# Patient Record
Sex: Female | Born: 1990 | Race: White | Hispanic: No | Marital: Married
Health system: Southern US, Community
[De-identification: ages and names within clinical notes are randomized; demographics above are authoritative.]

## PROBLEM LIST (undated history)

## (undated) DIAGNOSIS — E282 Polycystic ovarian syndrome: Secondary | ICD-10-CM

## (undated) HISTORY — DX: Polycystic ovarian syndrome: E28.2

---
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
## 2015-03-05 ENCOUNTER — Encounter: Payer: Self-pay | Admitting: Skilled Nursing Facility1

## 2015-03-05 ENCOUNTER — Encounter: Payer: BLUE CROSS/BLUE SHIELD | Attending: Obstetrics & Gynecology | Admitting: Skilled Nursing Facility1

## 2015-03-05 VITALS — Ht 62.0 in | Wt 161.0 lb

## 2015-03-05 DIAGNOSIS — E663 Overweight: Secondary | ICD-10-CM | POA: Diagnosis not present

## 2015-03-05 DIAGNOSIS — Z713 Dietary counseling and surveillance: Secondary | ICD-10-CM | POA: Insufficient documentation

## 2015-03-05 NOTE — Progress Notes (Signed)
  Medical Nutrition Therapy:  Appt start time: 1500 end time:  1530.   Assessment:  Primary concerns today: referred for PCOS.  Dietitian states she wants to eat healthy. Pt states she is [place]. Pt states she has had lots of abdominal pain that has been attributed to PCOS. Pt states she Eats eggs and fish. Pt states her sleep is not god with no rest.  Preferred Learning Style:   No preference indicated   Learning Readiness:   Change in progress  MEDICATIONS: See List   DIETARY INTAKE:  Usual eating pattern includes 3 meals and 2-3 snacks per day.  Everyday foods include none stated.  Avoided foods include none stated.    24-hr recall:  B ( AM): milk and carnation breafast----english muffin sandwhich Snk ( AM): oranges L ( PM): tuna sandwhiches----ordered pizza---beans and rice with greek yogurt Snk ( PM): almonds---cheese D ( PM): fish, rice, vegetables Snk ( PM): leftover pizza Beverages: sweet tea, coffee, water, flavored water, hot tea   Usual physical activity: ADL's  Estimated energy needs: 1800 calories 200 g carbohydrates 135 g protein 50 g fat  Progress Towards Goal(s):  In progress.   Nutritional Diagnosis:  NB-1.1 Food and nutrition-related knowledge deficit As related to newly diagnosed PCOS.  As evidenced by pt report and 24 hr recall.    Intervention:  Nutrition counseling for PCOS. Dietitian educated the pt on carbohydrates, balanced meals, carbohydrates throughout the day, and physical activity.  Goals: -Only consume serving sizes -Do not eat carbohydrates without protein -Limit the amount of pizza you drink -Limit the amount of sweet tea and caffinated beverages you drink  Teaching Method Utilized:  Visual Auditory  Demonstrated degree of understanding via:  Teach Back   Monitoring/Evaluation:  Dietary intake, exercise, and body weight prn.

## 2015-03-05 NOTE — Patient Instructions (Signed)
-  Only consume serving sizes -Do not eat carbohydrates without protein -Limit the amount of pizza you drink -Limit the amount of sweet tea and caffinated beverages you drink

## 2015-04-13 ENCOUNTER — Encounter: Payer: Self-pay | Admitting: Obstetrics & Gynecology

## 2015-08-28 DIAGNOSIS — H5213 Myopia, bilateral: Secondary | ICD-10-CM | POA: Diagnosis not present

## 2015-10-19 DIAGNOSIS — M79671 Pain in right foot: Secondary | ICD-10-CM | POA: Diagnosis not present

## 2015-10-19 DIAGNOSIS — M2011 Hallux valgus (acquired), right foot: Secondary | ICD-10-CM | POA: Diagnosis not present

## 2015-10-19 DIAGNOSIS — M722 Plantar fascial fibromatosis: Secondary | ICD-10-CM | POA: Diagnosis not present

## 2015-10-19 DIAGNOSIS — M71571 Other bursitis, not elsewhere classified, right ankle and foot: Secondary | ICD-10-CM | POA: Diagnosis not present

## 2015-11-02 DIAGNOSIS — M722 Plantar fascial fibromatosis: Secondary | ICD-10-CM | POA: Diagnosis not present

## 2015-11-02 DIAGNOSIS — M71571 Other bursitis, not elsewhere classified, right ankle and foot: Secondary | ICD-10-CM | POA: Diagnosis not present

## 2015-11-18 DIAGNOSIS — M722 Plantar fascial fibromatosis: Secondary | ICD-10-CM | POA: Diagnosis not present

## 2015-11-18 DIAGNOSIS — M71571 Other bursitis, not elsewhere classified, right ankle and foot: Secondary | ICD-10-CM | POA: Diagnosis not present

## 2016-03-19 DIAGNOSIS — M549 Dorsalgia, unspecified: Secondary | ICD-10-CM | POA: Diagnosis not present

## 2016-03-19 DIAGNOSIS — E669 Obesity, unspecified: Secondary | ICD-10-CM | POA: Diagnosis not present

## 2016-03-19 DIAGNOSIS — M5430 Sciatica, unspecified side: Secondary | ICD-10-CM | POA: Diagnosis not present

## 2016-04-06 DIAGNOSIS — M719 Bursopathy, unspecified: Secondary | ICD-10-CM | POA: Diagnosis not present

## 2016-05-03 DIAGNOSIS — N912 Amenorrhea, unspecified: Secondary | ICD-10-CM | POA: Diagnosis not present

## 2016-05-03 DIAGNOSIS — M25551 Pain in right hip: Secondary | ICD-10-CM | POA: Diagnosis not present

## 2016-05-03 DIAGNOSIS — Z3202 Encounter for pregnancy test, result negative: Secondary | ICD-10-CM | POA: Diagnosis not present

## 2016-05-16 DIAGNOSIS — M5441 Lumbago with sciatica, right side: Secondary | ICD-10-CM | POA: Diagnosis not present

## 2016-06-30 DIAGNOSIS — M545 Low back pain: Secondary | ICD-10-CM | POA: Diagnosis not present

## 2016-07-07 DIAGNOSIS — M545 Low back pain: Secondary | ICD-10-CM | POA: Diagnosis not present

## 2016-07-21 DIAGNOSIS — M545 Low back pain: Secondary | ICD-10-CM | POA: Diagnosis not present

## 2016-07-27 DIAGNOSIS — Z6830 Body mass index (BMI) 30.0-30.9, adult: Secondary | ICD-10-CM | POA: Diagnosis not present

## 2016-07-27 DIAGNOSIS — E669 Obesity, unspecified: Secondary | ICD-10-CM | POA: Diagnosis not present

## 2016-07-27 DIAGNOSIS — Z0000 Encounter for general adult medical examination without abnormal findings: Secondary | ICD-10-CM | POA: Diagnosis not present

## 2016-07-27 DIAGNOSIS — N926 Irregular menstruation, unspecified: Secondary | ICD-10-CM | POA: Diagnosis not present

## 2016-08-09 ENCOUNTER — Other Ambulatory Visit (HOSPITAL_COMMUNITY)
Admission: RE | Admit: 2016-08-09 | Discharge: 2016-08-09 | Disposition: A | Discharge status: Home / Self Care | Payer: BLUE CROSS/BLUE SHIELD | Source: Ambulatory Visit | Attending: Obstetrics and Gynecology | Admitting: Obstetrics and Gynecology

## 2016-08-09 ENCOUNTER — Other Ambulatory Visit: Payer: Self-pay | Admitting: Obstetrics and Gynecology

## 2016-08-09 DIAGNOSIS — Z309 Encounter for contraceptive management, unspecified: Secondary | ICD-10-CM | POA: Diagnosis not present

## 2016-08-09 DIAGNOSIS — Z01419 Encounter for gynecological examination (general) (routine) without abnormal findings: Secondary | ICD-10-CM | POA: Insufficient documentation

## 2016-08-09 DIAGNOSIS — Z113 Encounter for screening for infections with a predominantly sexual mode of transmission: Secondary | ICD-10-CM | POA: Diagnosis not present

## 2016-08-09 DIAGNOSIS — Z01411 Encounter for gynecological examination (general) (routine) with abnormal findings: Secondary | ICD-10-CM | POA: Diagnosis not present

## 2016-08-11 LAB — CYTOLOGY - PAP: Diagnosis: NEGATIVE

## 2016-11-02 DIAGNOSIS — J069 Acute upper respiratory infection, unspecified: Secondary | ICD-10-CM | POA: Diagnosis not present

## 2016-11-02 DIAGNOSIS — B09 Unspecified viral infection characterized by skin and mucous membrane lesions: Secondary | ICD-10-CM | POA: Diagnosis not present

## 2017-01-09 DIAGNOSIS — M25512 Pain in left shoulder: Secondary | ICD-10-CM | POA: Diagnosis not present

## 2017-01-25 DIAGNOSIS — M7542 Impingement syndrome of left shoulder: Secondary | ICD-10-CM | POA: Diagnosis not present

## 2017-02-09 DIAGNOSIS — M7542 Impingement syndrome of left shoulder: Secondary | ICD-10-CM | POA: Diagnosis not present

## 2017-02-16 DIAGNOSIS — M7542 Impingement syndrome of left shoulder: Secondary | ICD-10-CM | POA: Diagnosis not present

## 2017-03-02 DIAGNOSIS — M7542 Impingement syndrome of left shoulder: Secondary | ICD-10-CM | POA: Diagnosis not present

## 2017-03-09 DIAGNOSIS — M7542 Impingement syndrome of left shoulder: Secondary | ICD-10-CM | POA: Diagnosis not present

## 2017-08-08 DIAGNOSIS — Z6834 Body mass index (BMI) 34.0-34.9, adult: Secondary | ICD-10-CM | POA: Diagnosis not present

## 2017-08-08 DIAGNOSIS — Z0000 Encounter for general adult medical examination without abnormal findings: Secondary | ICD-10-CM | POA: Diagnosis not present

## 2017-08-08 DIAGNOSIS — Z13220 Encounter for screening for lipoid disorders: Secondary | ICD-10-CM | POA: Diagnosis not present

## 2017-08-11 DIAGNOSIS — Z01411 Encounter for gynecological examination (general) (routine) with abnormal findings: Secondary | ICD-10-CM | POA: Diagnosis not present

## 2017-09-03 DIAGNOSIS — N3001 Acute cystitis with hematuria: Secondary | ICD-10-CM | POA: Diagnosis not present

## 2018-02-20 DIAGNOSIS — E282 Polycystic ovarian syndrome: Secondary | ICD-10-CM | POA: Diagnosis not present

## 2018-03-23 DIAGNOSIS — E282 Polycystic ovarian syndrome: Secondary | ICD-10-CM | POA: Diagnosis not present

## 2019-12-29 ENCOUNTER — Emergency Department: Payer: 59

## 2019-12-29 ENCOUNTER — Emergency Department
Admission: EM | Admit: 2019-12-29 | Discharge: 2019-12-30 | Disposition: A | Discharge status: Home / Self Care | Payer: 59 | Attending: Emergency Medicine | Admitting: Emergency Medicine

## 2019-12-29 ENCOUNTER — Other Ambulatory Visit: Payer: Self-pay

## 2019-12-29 DIAGNOSIS — M25512 Pain in left shoulder: Secondary | ICD-10-CM | POA: Insufficient documentation

## 2019-12-29 DIAGNOSIS — R0789 Other chest pain: Secondary | ICD-10-CM | POA: Insufficient documentation

## 2019-12-29 DIAGNOSIS — Z5321 Procedure and treatment not carried out due to patient leaving prior to being seen by health care provider: Secondary | ICD-10-CM | POA: Diagnosis not present

## 2019-12-29 LAB — BASIC METABOLIC PANEL
Anion gap: 14 (ref 5–15)
BUN: 12 mg/dL (ref 6–20)
CO2: 24 mmol/L (ref 22–32)
Calcium: 9.5 mg/dL (ref 8.9–10.3)
Chloride: 101 mmol/L (ref 98–111)
Creatinine, Ser: 0.74 mg/dL (ref 0.44–1.00)
GFR, Estimated: >60 mL/min (ref >60)
Glucose, Bld: 126 mg/dL — ABNORMAL HIGH (ref 70–99)
Potassium: 4 mmol/L (ref 3.5–5.1)
Sodium: 139 mmol/L (ref 135–145)

## 2019-12-29 LAB — CBC
HCT: 45.5 % (ref 36.0–46.0)
Hemoglobin: 16.1 g/dL — ABNORMAL HIGH (ref 12.0–15.0)
MCH: 30.7 pg (ref 26.0–34.0)
MCHC: 35.4 g/dL (ref 30.0–36.0)
MCV: 86.8 fL (ref 80.0–100.0)
Platelets: 346 10*3/uL (ref 150–400)
RBC: 5.24 MIL/uL — ABNORMAL HIGH (ref 3.87–5.11)
RDW: 11.9 % (ref 11.5–15.5)
WBC: 13.2 10*3/uL — ABNORMAL HIGH (ref 4.0–10.5)
nRBC: 0 % (ref 0.0–0.2)

## 2019-12-29 LAB — TROPONIN I (HIGH SENSITIVITY): Troponin I (High Sensitivity): <2 ng/L (ref <18)

## 2019-12-29 NOTE — ED Triage Notes (Signed)
Pt comes in for left shoulder pain/burning from chest to jaw. Pt states it got worse when she tried to use her shoulder to get in the car today.

## 2019-12-31 ENCOUNTER — Encounter: Payer: Self-pay | Admitting: Skilled Nursing Facility1

## 2020-01-20 NOTE — Progress Notes (Signed)
Subjective:    CC: L shoulder pain  I, Christina Hull, LAT, ATC, am serving as scribe for Dr. Clementeen Hull.  HPI: Pt is a 29 y/o female presenting w/ c/o L shoulder pain over a month w/ no known MOI.  She locates her pain to to anterior shoulder and deep into joint, over trap. Pn goes into chest and down anterior upper arm. Pt describes pn as "soreness."  Pain is worse with lifting and at bedtime.  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  She denies any pain radiating below the level of the elbow.  She had x-rays obtained recently of her shoulder and chest which were normal.  Aggravating factors: overhead AROM Treatments tried: OTC analgesics; steroid dose pack; heat, ice. Increased walking  Diagnostic imaging: L shoulder XR and chest x-ray- 12/29/19  Pertinent review of Systems: No fevers or chills  Relevant historical information: Otherwise healthy   Objective:    Vitals:   01/21/20 0834  BP: (!) 106/58  Pulse: 84  SpO2: 97%   General: Well Developed, well nourished, and in no acute distress.   MSK: C-spine normal-appearing nontender midline.  Tender palpation left trapezius. Normal cervical motion. Upper extremity strength is intact throughout bilateral extremities except noted below. Reflexes intact and equal bilaterally. Negative Spurling's test.  Left shoulder normal-appearing Not particularly tender at [facility] joint biceps tendon or [place] joint into chest.  Somewhat tender to palpation inferior to [place] joint along costal margin at left superior sternal costal area Range of motion abduction 140 degrees.  External rotation full internal rotation lumbar spine. Strength intact abduction and internal rotation 5/5.  4/5 external rotation with some pain. Positive Hawkins and Neer's test.  Negative empty can test. Negative Yergason's and speeds test. Positive crossover arm compression test.    Lab and Radiology Results  DG  Chest 2 View  Result Date: 12/29/2019 CLINICAL DATA:  Chest pain EXAM: CHEST - 2 VIEW COMPARISON:  None. FINDINGS: Heart and mediastinal contours are within normal limits. No focal opacities or effusions. No acute bony abnormality. IMPRESSION: No active cardiopulmonary disease. Electronically Signed   By: Charlett Nose M.D.   On: 12/29/2019 16:40   DG Shoulder Left  Result Date: 12/29/2019 CLINICAL DATA:  Left shoulder pain EXAM: LEFT SHOULDER - 2+ VIEW COMPARISON:  None. FINDINGS: There is no evidence of fracture or dislocation. There is no evidence of arthropathy or other focal bone abnormality. Soft tissues are unremarkable. IMPRESSION: Negative. Electronically Signed   By: Charlett Nose M.D.   On: 12/29/2019 16:41   I, Christina Hull, personally (independently) visualized and performed the interpretation of the images attached in this note.  No significant abnormality seen in shoulder and chest x-ray.  Diagnostic Limited MSK Ultrasound of: Left shoulder Biceps tendon intact normal-appearing Subscapularis tendon intact normal-appearing Supraspinatus tendon intact without tear or retraction. Mild increased thickness subacromial bursa present. Infraspinatus tendon is intact and normal appearing AC joint normal-appearing Impression: Mild subacromial bursitis    Impression and Recommendations:    Assessment and Plan: 29 y.o. female with left shoulder pain and chest pain.  Multifactorial.  Some of the pain is related to rotator cuff tendinopathy and bursitis.  However she also has some periscapular dysfunction and some chest wall dysfunction as well.  She should do quite well with physical therapy.  Plan for referral to physical therapy at [facility].  If this cannot work in  a timely manner would consider med [facility] as next choice given where she lives.  Recheck back with me in about 6 weeks.  Recheck or return sooner if needed.  Also recommend heat and TENS unit and tizanidine.   Tizanidine prescribed.Marland Kitchen  PDMP not reviewed this encounter. Orders Placed This Encounter  Procedures  . [place] LIMITED JOINT SPACE STRUCTURES UP LEFT(NO LINKED CHARGES)    Standing Status:   Future    Number of Occurrences:   1    Standing Expiration Date:   07/20/2020    Order Specific Question:   Reason for Exam (SYMPTOM  OR DIAGNOSIS REQUIRED)    Answer:   L shoulder pain    Order Specific Question:   Preferred imaging location?    Answer:   Adult nurse [facility]  . Ambulatory referral to Physical Therapy    Referral Priority:   Routine    Referral Type:   Physical Medicine    Referral Reason:   Specialty Services Required    Requested Specialty:   Physical Therapy   Meds ordered this encounter  Medications  . tiZANidine (ZANAFLEX) 4 MG tablet    Sig: Take 1 tablet (4 mg total) by mouth every 6 (six) hours as needed for muscle spasms.    Dispense:  30 tablet    Refill:  1    Discussed warning signs or symptoms. Please see discharge instructions. Patient expresses understanding.   The above documentation has been reviewed and is accurate and complete Christina Hull, M.D.

## 2020-01-21 ENCOUNTER — Other Ambulatory Visit: Payer: Self-pay

## 2020-01-21 ENCOUNTER — Ambulatory Visit (INDEPENDENT_AMBULATORY_CARE_PROVIDER_SITE_OTHER): Payer: 59 | Admitting: Family Medicine

## 2020-01-21 ENCOUNTER — Ambulatory Visit: Payer: Self-pay

## 2020-01-21 VITALS — BP 106/58 | HR 84 | Ht 62.0 in | Wt 198.0 lb

## 2020-01-21 DIAGNOSIS — M25512 Pain in left shoulder: Secondary | ICD-10-CM

## 2020-01-21 DIAGNOSIS — G8929 Other chronic pain: Secondary | ICD-10-CM

## 2020-01-21 MED ORDER — TIZANIDINE HCL 4 MG PO TABS: 4.0000 mg | ORAL_TABLET | Freq: Four times a day (QID) | ORAL | 1 refills | Status: DC | PRN

## 2020-01-21 NOTE — Patient Instructions (Addendum)
Thank you for coming in today.  I've referred you to Physical Therapy.  Let us know if you don't hear from them in one week.  Heating pad should help.   TENS UNIT: This is helpful for muscle pain and spasm.   Search and Purchase a TENS 7000 2nd edition at  www.tenspros.com or www.Amazon.com It should be less than $30.     TENS unit instructions: Do not shower or bathe with the unit on . Turn the unit off before removing electrodes or batteries . If the electrodes lose stickiness add a drop of water to the electrodes after they are disconnected from the unit and place on plastic sheet. If you continued to have difficulty, call the TENS unit company to purchase more electrodes. . Do not apply lotion on the skin area prior to use. Make sure the skin is clean and dry as this will help prolong the life of the electrodes. . After use, always check skin for unusual red areas, rash or other skin difficulties. If there are any skin problems, does not apply electrodes to the same area. . Never remove the electrodes from the unit by pulling the wires. . Do not use the TENS unit or electrodes other than as directed. . Do not change electrode placement without consultating your therapist or physician. Marland Kitchen Keep 2 fingers with between each electrode. . Wear time ratio is 2:1, on to off times.    For example on for 30 minutes off for 15 minutes and then on for 30 minutes off for 15 minutes    Try the muscle relaxer at bedtime as needed.    If not improving in 6 weeks or so recheck or let me know.

## 2020-02-10 ENCOUNTER — Other Ambulatory Visit: Payer: Self-pay

## 2020-02-10 ENCOUNTER — Ambulatory Visit: Payer: 59 | Attending: Family Medicine | Admitting: Physical Therapy

## 2020-02-10 ENCOUNTER — Encounter: Payer: Self-pay | Admitting: Physical Therapy

## 2020-02-10 DIAGNOSIS — R293 Abnormal posture: Secondary | ICD-10-CM | POA: Diagnosis present

## 2020-02-10 DIAGNOSIS — G8929 Other chronic pain: Secondary | ICD-10-CM | POA: Diagnosis present

## 2020-02-10 DIAGNOSIS — M25512 Pain in left shoulder: Secondary | ICD-10-CM | POA: Diagnosis not present

## 2020-02-10 DIAGNOSIS — M6281 Muscle weakness (generalized): Secondary | ICD-10-CM

## 2020-02-10 NOTE — Patient Instructions (Signed)
Access Code: [email or web address] URL: [email or web address] Date: 02/10/2020 Prepared by: Loistine Simas Beuhring  Exercises Supine Shoulder Flexion Extension AAROM with Dowel - 1 x daily - 7 x weekly - 1 sets - 10 reps Supine Cervical Retraction with Towel - 1 x daily - 7 x weekly - 1 sets - 10 reps - 5 hold Seated Scapular Retraction - 1 x daily - 7 x weekly - 1 sets - 10 reps Doorway Pec Stretch at 90 Degrees Abduction - 1 x daily - 7 x weekly - 1 sets - 2 reps - 20 hold

## 2020-02-10 NOTE — Therapy (Signed)
[facility] [address] [place], [place], [address] Phone: [phone]   Fax:  [phone]  Physical Therapy Evaluation  Patient Details  Name: Christina Hull MRN: [id number] Date of Birth: 09/11/1990 Referring Provider (PT): Rodolph Bong, MD   Encounter Date: 02/10/2020   PT End of Session - 02/10/20 1305    Visit Number 1    Date for PT Re-Evaluation 04/20/20    Authorization Type [employer]    PT Start Time 1015    PT Stop Time 1100    PT Time Calculation (min) 45 min    Activity Tolerance Patient tolerated treatment well;Patient limited by pain    Behavior During Therapy [facility] for tasks assessed/performed           History reviewed. No pertinent past medical history.  History reviewed. No pertinent surgical history.  There were no vitals filed for this visit.    Subjective Assessment - 02/10/20 1020    Subjective Pt had insidious onset of Lt shoulder pain 1-2 months ago.  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  Pt also notes pain extends into chest and Lt neck. Pain extends into arm but not below elbow.    Limitations House hold activities;Lifting    Diagnostic tests Xrays and [place]: RC intact, mild subacromial bursitis    Patient Stated Goals be able to use Lt arm for more than 20 min, be able to lift    Currently in Pain? Yes    Pain Score 0-No pain   up to 6/10   Pain Location Shoulder    Pain Orientation Left;Anterior;Proximal;Upper    Pain Descriptors / Indicators Aching;Dull;Sharp    Pain Type Chronic pain    Pain Radiating Towards neck and chest    Pain Onset More than a month ago    Pain Frequency Intermittent    Aggravating Factors  reaching, lifting, getting out of chair, bending over to pick something up    Pain Relieving Factors rest, heat              [facility] PT Assessment - 02/10/20 0001      Assessment   Medical Diagnosis M25.512,G89.29 (ICD-10-CM) -  Chronic left shoulder pain    Referring Provider (PT) Rodolph Bong, MD    Onset Date/Surgical Date --   1-2 mos ago   Hand Dominance Right    Next MD Visit 6 weeks, MRI and/or shot if no improvement    Prior Therapy yes for same shoulder      Precautions   Precautions None      Restrictions   Weight Bearing Restrictions No      Balance Screen   Has the patient fallen in the past 6 months No      Home Environment   Living Environment Private residence    Living Arrangements Spouse/significant other      Prior Function   Level of Independence Independent    Vocation Unemployed    Leisure walk, video games, TV, cook      Cognition   Overall Cognitive Status Within Functional Limits for tasks assessed      Observation/Other Assessments   Focus on Therapeutic Outcomes (FOTO)  49% goal 29%      Posture/Postural Control   Posture/Postural Control Postural limitations    Postural Limitations Rounded Shoulders;Forward head;Increased thoracic kyphosis      ROM / Strength   AROM / PROM / Strength  AROM;Strength      AROM   Overall AROM Comments Rt shoulder flexion and abduction end range stiffness no pain    AROM Assessment Site Cervical;Shoulder    Right/Left Shoulder Left    Left Shoulder Extension 15 Degrees    Left Shoulder Flexion 135 Degrees   painful arc, shuddering weakness   Left Shoulder ABduction 130 Degrees   painful arc, shuddering weakness   Left Shoulder Internal Rotation --   [facility]   Left Shoulder External Rotation --   [facility]   Cervical Flexion 30   left neck pain   Cervical Extension 25    Cervical - Right Side Bend 20    Cervical - Left Side Bend 20    Cervical - Right Rotation 70    Cervical - Left Rotation 70   tight     Strength   Overall Strength Comments bil shoulders 4-/5, Lt scapula 3/5, Rt scapula 4-/5      Palpation   Palpation comment Lt supraspinatus, Lt upper trap, bicep tendon, pectorals tender                       Objective  measurements completed on examination: See above findings.               PT Education - 02/10/20 1054    Education Details Access Code: [email or web address]    Person(s) Educated Patient    Methods Explanation;Demonstration;Handout    Comprehension Verbalized understanding;Returned demonstration            PT Short Term Goals - 02/10/20 1316      PT SHORT TERM GOAL #1   Title pt will be ind with initial HEP    Time 3    Period Weeks    Status New    Target Date 03/02/20      PT SHORT TERM GOAL #2   Title Pt will report reduced Lt shoulder pain by at least 20% with ADLs and light household tasks.    Time 4    Period Weeks    Status New    Target Date 03/09/20      PT SHORT TERM GOAL #3   Title Pt will demo improved scapular strength of at least 3+/5 for functional reaching    Time 4    Period Weeks    Status New    Target Date 03/09/20      PT SHORT TERM GOAL #4   Title Pt will be able to perform A/ROM of Lt shoulder flexion and scaption with min pain to at least 140 deg    Time 6    Period Weeks    Status New    Target Date 03/23/20             PT Long Term Goals - 02/10/20 1319      PT LONG TERM GOAL #1   Title Ind with advanced HEP and understand how to safely progress.    Time 12    Period Weeks    Status New    Target Date 05/04/20      PT LONG TERM GOAL #2   Title Pt will be able to perform ADLs and light household tasks for at least 30 min with minimal or no pain/fatigue.    Time 12    Period Weeks    Status New    Target Date 05/04/20      PT LONG TERM GOAL #3   Title Lt  shoulder girdle strength of at least 4/5 for improved reaching, lifting and carrying    Time 12    Period Weeks    Status New    Target Date 05/04/20      PT LONG TERM GOAL #4   Title Pt will achieve Lt shoulder flexion of at least 150 deg for improved overhead tasks.    Time 12    Period Weeks    Status New    Target Date 05/04/20      PT LONG TERM GOAL #5   Title  Reduce FOTO to </= 29% to demo less limitation    Baseline 49%    Time 12    Period Weeks    Status New    Target Date 05/04/20                  Plan - 02/10/20 1054    Clinical Impression Statement Pt is a pleasant 28yo with insidious onset of Lt shoulder pain approx 2 mos ago.  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  She is currently unemployed.  She reports Lt UE fatigue limits her to 20 min bouts of activity.  She has limited bil shoulder A/ROM for flexion and abduction, with shuddering weakness, pain and stiffness on Lt.  Lt shoulder pain radiates from anterior and superior into chest and up Lt side of neck.  Pt has signif weakness of Lt shoulder girdle, especially Lt scapula.  She has singif tenderness to palpation along Lt supraspinatus, upper trap, pectorals.  FOTO score is 49% with goal of 29%.  PT initiated HEP today along what Pt could do without pain.  Pt is participating in aquatic chronic pain program 3 days/week so wants to do PT 1x/week to start.  She will benefit from gentle mobility, stretching and strengthening/stabilization training to address pain, improve trunk postural strength, and improved functional use of Lt UE.    Personal Factors and Comorbidities Comorbidity 1;Fitness    Comorbidities chronic pain    Examination-Activity Limitations Lift;Bend;Reach Overhead;Dressing;Carry;Caring for Others    Examination-Participation Restrictions Community Activity;Shop;Meal Prep;Cleaning    Stability/Clinical Decision Making Stable/Uncomplicated    Clinical Decision Making Low    Rehab Potential Good    PT Frequency 1x / week    PT Duration 12 weeks    PT Treatment/Interventions ADLs/Self Care Home Management;Electrical Stimulation;Cryotherapy;Moist Heat;Iontophoresis 4mg /ml Dexamethasone;Neuromuscular re-education;Therapeutic exercise;Therapeutic activities;Functional mobility training;Manual techniques;Patient/family education;Taping;Passive range of  motion;Joint Manipulations;Spinal Manipulations    PT Next Visit Plan review HEP, Lt shoulder AA/ROM, trunk stabilization, gentle Lt scapular strength, give cervical and shoulder isometrics if tolerated    PT Home Exercise Plan Access Code: [email or web address]    Recommended Other Services Pt doing chronic pain management aquatic program with Y 3x/week    Consulted and Agree with Plan of Care Patient           Patient will benefit from skilled therapeutic intervention in order to improve the following deficits and impairments:  Decreased range of motion, Pain, Postural dysfunction, Decreased strength, Decreased mobility, Impaired UE functional use, Decreased activity tolerance, Improper body mechanics  Visit Diagnosis: Chronic left shoulder pain - Plan: PT plan of care cert/re-cert  Muscle weakness (generalized) - Plan: PT plan of care cert/re-cert  Abnormal posture - Plan: PT plan of care cert/re-cert     Problem List There are no problems to display for this patient.   Loistine SimasJohanna Beuhring, PT 02/10/20 1:27 PM   [facility]  [address], [address] [place], [place], [address] Phone: [phone]   Fax:  [phone]  Name: Christina Hull MRN: [id number] Date of Birth: 1991/03/13

## 2020-02-20 ENCOUNTER — Encounter: Payer: Self-pay | Admitting: Physical Therapy

## 2020-02-20 ENCOUNTER — Ambulatory Visit: Payer: 59 | Attending: Family Medicine | Admitting: Physical Therapy

## 2020-02-20 ENCOUNTER — Other Ambulatory Visit: Payer: Self-pay

## 2020-02-20 DIAGNOSIS — G8929 Other chronic pain: Secondary | ICD-10-CM | POA: Diagnosis present

## 2020-02-20 DIAGNOSIS — M6281 Muscle weakness (generalized): Secondary | ICD-10-CM

## 2020-02-20 DIAGNOSIS — M25512 Pain in left shoulder: Secondary | ICD-10-CM | POA: Insufficient documentation

## 2020-02-20 DIAGNOSIS — R293 Abnormal posture: Secondary | ICD-10-CM

## 2020-02-20 NOTE — Therapy (Signed)
[facility] [address], [address] [place], [place], [address] Phone: [phone]   Fax:  [phone]  Physical Therapy Treatment  Patient Details  Name: Christina Hull MRN: [id number] Date of Birth: [birth year] Referring Provider (PT): Rodolph Bong, MD   Encounter Date: 02/20/2020   PT End of Session - 02/20/20 1110    Visit Number 2    Date for PT Re-Evaluation 04/20/20    Authorization Type Cigna    PT Start Time 1102    PT Stop Time 1143    PT Time Calculation (min) 41 min    Activity Tolerance Patient tolerated treatment well;Patient limited by pain    Behavior During Therapy [facility] for tasks assessed/performed           History reviewed. No pertinent past medical history.  History reviewed. No pertinent surgical history.  There were no vitals filed for this visit.   Subjective Assessment - 02/20/20 1214    Subjective I am doing much better.  The aquatic workouts are helping, I can move more that I was. Pain feels like both sides but more the left    Patient Stated Goals be able to use Lt arm for more than 20 min, be able to lift    Currently in Pain? Yes    Pain Score 4     Pain Location Shoulder    Pain Orientation Left;Right    Pain Descriptors / Indicators Aching    Pain Type Chronic pain    Pain Radiating Towards neck and chest and front of the shoulder    Pain Onset More than a month ago    Pain Frequency Intermittent    Multiple Pain Sites No                             [facility] Adult PT Treatment/Exercise - 02/20/20 0001      Self-Care   Self-Care Other Self-Care Comments    Other Self-Care Comments  posture education in sitting      Exercises   Exercises Shoulder      Shoulder Exercises: Supine   External Rotation Strengthening;Both;5 reps;Theraband    Theraband Level (Shoulder External Rotation) Level 1 (Yellow)    External Rotation Limitations stopped due to feeling pain in  Lt shoulder    Other Supine Exercises cervical retraction with cues to do smaller ROM - pain free range      Shoulder Exercises: Pulleys   Flexion 2 minutes      Manual Therapy   Manual Therapy Myofascial release;Soft tissue mobilization    Soft tissue mobilization Lt upper trap and pecs    Myofascial Release suboccipitals and cervical paraspinals                    PT Short Term Goals - 02/20/20 1222      PT SHORT TERM GOAL #1   Title pt will be ind with initial HEP    Status Achieved             PT Long Term Goals - 02/10/20 1319      PT LONG TERM GOAL #1   Title Ind with advanced HEP and understand how to safely progress.    Time 12    Period Weeks    Status New    Target Date 05/04/20      PT LONG TERM GOAL #2   Title Pt will be  able to perform ADLs and light household tasks for at least 30 min with minimal or no pain/fatigue.    Time 12    Period Weeks    Status New    Target Date 05/04/20      PT LONG TERM GOAL #3   Title Lt shoulder girdle strength of at least 4/5 for improved reaching, lifting and carrying    Time 12    Period Weeks    Status New    Target Date 05/04/20      PT LONG TERM GOAL #4   Title Pt will achieve Lt shoulder flexion of at least 150 deg for improved overhead tasks.    Time 12    Period Weeks    Status New    Target Date 05/04/20      PT LONG TERM GOAL #5   Title Reduce FOTO to </= 29% to demo less limitation    Baseline 49%    Time 12    Period Weeks    Status New    Target Date 05/04/20                 Plan - 02/20/20 1216    Clinical Impression Statement Today is initial treatment since eval.  pt feeling good with the intial HEP and doing aquatic ex's.  Today's session focused on ROM and soft tissue release as well as improved seated posture.  Pt states she sits a good amount and her posture looked and felt better after review and demo of foot support, lumbar and thoracic support.  Pt will benefit from  skilled PT to continue working towards functional goals.    PT Treatment/Interventions ADLs/Self Care Home Management;Electrical Stimulation;Cryotherapy;Moist Heat;Iontophoresis 4mg /ml Dexamethasone;Neuromuscular re-education;Therapeutic exercise;Therapeutic activities;Functional mobility training;Manual techniques;Patient/family education;Taping;Passive range of motion;Joint Manipulations;Spinal Manipulations    PT Next Visit Plan review posture as needed, thoracic SB and rotation, soulder and cervical ROM, cervical and shoulder isometric    PT Home Exercise Plan Access Code: [email or web address]    Consulted and Agree with Plan of Care Patient           Patient will benefit from skilled therapeutic intervention in order to improve the following deficits and impairments:  Decreased range of motion,Pain,Postural dysfunction,Decreased strength,Decreased mobility,Impaired UE functional use,Decreased activity tolerance,Improper body mechanics  Visit Diagnosis: Chronic left shoulder pain  Muscle weakness (generalized)  Abnormal posture     Problem List There are no problems to display for this patient.   Geriann Lafont, PT 02/20/2020, 12:29 PM  [facility] [address], [address] [place], [place], [place] Phone: [phone]   Fax:  [phone]  Name: Christina Hull MRN: Mariel Aloe Date of Birth: Jul 14, 1990

## 2020-02-25 ENCOUNTER — Other Ambulatory Visit: Payer: Self-pay

## 2020-02-25 ENCOUNTER — Ambulatory Visit: Payer: 59 | Admitting: Physical Therapy

## 2020-02-25 ENCOUNTER — Encounter: Payer: Self-pay | Admitting: Physical Therapy

## 2020-02-25 DIAGNOSIS — M6281 Muscle weakness (generalized): Secondary | ICD-10-CM

## 2020-02-25 DIAGNOSIS — R293 Abnormal posture: Secondary | ICD-10-CM

## 2020-02-25 DIAGNOSIS — M25512 Pain in left shoulder: Secondary | ICD-10-CM

## 2020-02-25 DIAGNOSIS — G8929 Other chronic pain: Secondary | ICD-10-CM

## 2020-02-25 NOTE — Patient Instructions (Signed)
Access Code: [email or web address] URL: [email or web address] Date: 02/25/2020 Prepared by: Dwana Curd  Exercises Supine Shoulder Flexion Extension AAROM with Dowel - 1 x daily - 7 x weekly - 1 sets - 10 reps Supine Cervical Retraction with Towel - 1 x daily - 7 x weekly - 1 sets - 10 reps - 5 hold Seated Scapular Retraction - 1 x daily - 7 x weekly - 1 sets - 10 reps Doorway Pec Stretch at 90 Degrees Abduction - 1 x daily - 7 x weekly - 1 sets - 2 reps - 20 hold Seated Cervical Sidebending Stretch - 1 x daily - 7 x weekly - 3 sets - 10 reps Supine Shoulder External Rotation with Resistance - 1 x daily - 7 x weekly - 3 sets - 10 reps  Patient Education Office Posture

## 2020-02-25 NOTE — Therapy (Signed)
[facility] [address] [place], [place], [address] Phone: [phone]   Fax:  [phone]  Physical Therapy Treatment  Patient Details  Name: Christina Hull MRN: [id number] Date of Birth: Jan 28, 1991 Referring Provider (PT): Gregor Hams, MD   Encounter Date: 02/25/2020   PT End of Session - 02/25/20 0852    Visit Number 3    Date for PT Re-Evaluation 04/20/20    Authorization Type [employer]    PT Start Time 0846    PT Stop Time 0926    PT Time Calculation (min) 40 min    Activity Tolerance Patient tolerated treatment well;Patient limited by pain    Behavior During Therapy [facility] for tasks assessed/performed           History reviewed. No pertinent past medical history.  History reviewed. No pertinent surgical history.  There were no vitals filed for this visit.   Subjective Assessment - 02/25/20 0850    Subjective I feel like I am doing better that last week.  Pt states it was 3/10 when first waking up,  Pt states it feels better after moving a little    Patient Stated Goals be able to use Lt arm for more than 20 min, be able to lift    Currently in Pain? No/denies                             [facility] Adult PT Treatment/Exercise - 02/25/20 0001      Shoulder Exercises: Standing   Extension Strengthening;Both;20 reps;Theraband    Theraband Level (Shoulder Extension) Level 1 (Yellow)      Shoulder Exercises: Pulleys   Flexion 5 minutes   with review of posture     Shoulder Exercises: Isometric Strengthening   Flexion 5X10"    ABduction 5X10"      Shoulder Exercises: Stretch   Corner Stretch Limitations doorway pec stretch    Other Shoulder Stretches cross arm stretch- 2 x 20sec      Manual Therapy   Soft tissue mobilization bilat upper trap and pecs; contract/relax to bilat UT                  PT Education - 02/25/20 0931    Education Details v    Person(s) Educated  Patient    Methods Explanation;Demonstration;Verbal cues;Handout;Tactile cues    Comprehension Verbalized understanding;Returned demonstration            PT Short Term Goals - 02/25/20 0902      PT SHORT TERM GOAL #1   Title pt will be ind with initial HEP    Status Achieved      PT SHORT TERM GOAL #2   Title Pt will report reduced Lt shoulder pain by at least 20% with ADLs and light household tasks.    Baseline 30% less    Status Achieved      PT SHORT TERM GOAL #3   Title Pt will demo improved scapular strength of at least 3+/5 for functional reaching    Status On-going      PT SHORT TERM GOAL #4   Title Pt will be able to perform A/ROM of Lt shoulder flexion and scaption with min pain to at least 140 deg    Baseline Lt shoulder - 149 deg of flex and 145 deg scap    Status Achieved  PT Long Term Goals - 02/10/20 1319      PT LONG TERM GOAL #1   Title Ind with advanced HEP and understand how to safely progress.    Time 12    Period Weeks    Status New    Target Date 05/04/20      PT LONG TERM GOAL #2   Title Pt will be able to perform ADLs and light household tasks for at least 30 min with minimal or no pain/fatigue.    Time 12    Period Weeks    Status New    Target Date 05/04/20      PT LONG TERM GOAL #3   Title Lt shoulder girdle strength of at least 4/5 for improved reaching, lifting and carrying    Time 12    Period Weeks    Status New    Target Date 05/04/20      PT LONG TERM GOAL #4   Title Pt will achieve Lt shoulder flexion of at least 150 deg for improved overhead tasks.    Time 12    Period Weeks    Status New    Target Date 05/04/20      PT LONG TERM GOAL #5   Title Reduce FOTO to </= 29% to demo less limitation    Baseline 49%    Time 12    Period Weeks    Status New    Target Date 05/04/20                 Plan - 02/25/20 1041    Clinical Impression Statement Goals met as detailed above.  Today's session focused  on progression of cervical and shoulder strength and ROM.  Pt responded well to exercises with intermittent stretching when pain increased.  Pt had no increased pain at the end of the session today.  Pt had palpable reduction of muscle tension in upper traps with contract/relax stretch.  Pt was able to progress strengthening and was able to do ER in supine without pain today. Pt will benefit from skilled PT to continue strength progressions    PT Treatment/Interventions ADLs/Self Care Home Management;Electrical Stimulation;Cryotherapy;Moist Heat;Iontophoresis 74m/ml Dexamethasone;Neuromuscular re-education;Therapeutic exercise;Therapeutic activities;Functional mobility training;Manual techniques;Patient/family education;Taping;Passive range of motion;Joint Manipulations;Spinal Manipulations    PT Next Visit Plan shoulder A/ROM and strength, posture strengthening, rhomboid and upper trap stretches prn    PT Home Exercise Plan Access Code: [email or web address]    Consulted and Agree with Plan of Care Patient           Patient will benefit from skilled therapeutic intervention in order to improve the following deficits and impairments:  Decreased range of motion,Pain,Postural dysfunction,Decreased strength,Decreased mobility,Impaired UE functional use,Decreased activity tolerance,Improper body mechanics  Visit Diagnosis: Chronic left shoulder pain  Muscle weakness (generalized)  Abnormal posture     Problem List There are no problems to display for this patient.   JCamillo FlamingDesenglau, PT 02/25/2020, 10:48 AM  [facility] [address] [place] [address]Phone: [phone]  Fax:  [phone] Name: Christina ZALENSKIMRN: [id number] of Birth: 108/27/1992

## 2020-03-02 NOTE — Progress Notes (Signed)
No show

## 2020-03-03 ENCOUNTER — Ambulatory Visit (INDEPENDENT_AMBULATORY_CARE_PROVIDER_SITE_OTHER): Payer: 59 | Admitting: Family Medicine

## 2020-03-03 DIAGNOSIS — Z5329 Procedure and treatment not carried out because of patient's decision for other reasons: Secondary | ICD-10-CM

## 2020-03-05 ENCOUNTER — Other Ambulatory Visit: Payer: Self-pay

## 2020-03-05 ENCOUNTER — Ambulatory Visit: Payer: 59 | Admitting: Physical Therapy

## 2020-03-05 ENCOUNTER — Encounter: Payer: Self-pay | Admitting: Physical Therapy

## 2020-03-05 DIAGNOSIS — G8929 Other chronic pain: Secondary | ICD-10-CM

## 2020-03-05 DIAGNOSIS — M25512 Pain in left shoulder: Secondary | ICD-10-CM | POA: Diagnosis not present

## 2020-03-05 DIAGNOSIS — M6281 Muscle weakness (generalized): Secondary | ICD-10-CM

## 2020-03-05 DIAGNOSIS — R293 Abnormal posture: Secondary | ICD-10-CM

## 2020-03-05 NOTE — Therapy (Signed)
[facility] [address], [address] [place], [place], [address] Phone: [phone]   Fax:  [phone]  Physical Therapy Treatment  Patient Details  Name: Christina Hull MRN: [id number] Date of Birth: December 29, 1990 Referring Provider (PT): Rodolph Bong, MD   Encounter Date: 03/05/2020   PT End of Session - 03/05/20 1448    Visit Number 4    Date for PT Re-Evaluation 04/20/20    Authorization Type [employer]    PT Start Time 1446    PT Stop Time 1526    PT Time Calculation (min) 40 min    Activity Tolerance Patient tolerated treatment well;Patient limited by pain    Behavior During Therapy [facility] for tasks assessed/performed           History reviewed. No pertinent past medical history.  History reviewed. No pertinent surgical history.  There were no vitals filed for this visit.   Subjective Assessment - 03/05/20 1449    Subjective I was a little worse this week and I think it was from drinking a drink with alcohol.    Patient Stated Goals be able to use Lt arm for more than 20 min, be able to lift    Currently in Pain? Yes    Pain Score 5     Pain Location Shoulder    Pain Orientation Left    Pain Descriptors / Indicators Aching    Pain Onset More than a month ago    Pain Frequency Intermittent    Multiple Pain Sites No                             [facility] Adult PT Treatment/Exercise - 03/05/20 0001      Shoulder Exercises: Supine   Horizontal ABduction Strengthening;Both;10 reps;Theraband    Theraband Level (Shoulder Horizontal ABduction) Level 1 (Yellow)    External Rotation Strengthening;Both;5 reps;Theraband    Theraband Level (Shoulder External Rotation) Level 1 (Yellow)    Other Supine Exercises small circle with 1lb    Other Supine Exercises shoulder flexion with cane and 3lb; single arm flex 1 lb      Shoulder Exercises: Sidelying   External Rotation Strengthening;Left;Weights;20 reps     Theraband Level (Shoulder External Rotation) Level 1 (Yellow)      Shoulder Exercises: Standing   Other Standing Exercises flexion on finger ladder    Other Standing Exercises ball up the wall      Shoulder Exercises: ROM/Strengthening   Pendulum as needed btwn ex's    Ball on Wall pball roll up wall 10x    Other ROM/Strengthening Exercises finger ladder with lift off - 8x                    PT Short Term Goals - 03/05/20 1526      PT SHORT TERM GOAL #3   Title Pt will demo improved scapular strength of at least 3+/5 for functional reaching    Baseline 4/5 MMT Lt shoulder/scap             PT Long Term Goals - 02/10/20 1319      PT LONG TERM GOAL #1   Title Ind with advanced HEP and understand how to safely progress.    Time 12    Period Weeks    Status New    Target Date 05/04/20      PT LONG TERM GOAL #2   Title Pt  will be able to perform ADLs and light household tasks for at least 30 min with minimal or no pain/fatigue.    Time 12    Period Weeks    Status New    Target Date 05/04/20      PT LONG TERM GOAL #3   Title Lt shoulder girdle strength of at least 4/5 for improved reaching, lifting and carrying    Time 12    Period Weeks    Status New    Target Date 05/04/20      PT LONG TERM GOAL #4   Title Pt will achieve Lt shoulder flexion of at least 150 deg for improved overhead tasks.    Time 12    Period Weeks    Status New    Target Date 05/04/20      PT LONG TERM GOAL #5   Title Reduce FOTO to </= 29% to demo less limitation    Baseline 49%    Time 12    Period Weeks    Status New    Target Date 05/04/20                 Plan - 03/05/20 1527    Clinical Impression Statement Pt achieved initial goal for strength.  She was able to tolerate a lot more exercises.  Pt has tension in upper trap and pecs but much less that last time and loosened more with STM.  Pt will benefit from skilled PT to continue to progress shoulder and scap  stability.    PT Treatment/Interventions ADLs/Self Care Home Management;Electrical Stimulation;Cryotherapy;Moist Heat;Iontophoresis 4mg /ml Dexamethasone;Neuromuscular re-education;Therapeutic exercise;Therapeutic activities;Functional mobility training;Manual techniques;Patient/family education;Taping;Passive range of motion;Joint Manipulations;Spinal Manipulations    PT Next Visit Plan RTC and scap strengthening; f/u on supine ex's and progress as tolerated    PT Home Exercise Plan Access Code: [email or web address]    Consulted and Agree with Plan of Care Patient           Patient will benefit from skilled therapeutic intervention in order to improve the following deficits and impairments:  Decreased range of motion,Pain,Postural dysfunction,Decreased strength,Decreased mobility,Impaired UE functional use,Decreased activity tolerance,Improper body mechanics  Visit Diagnosis: Chronic left shoulder pain  Muscle weakness (generalized)  Abnormal posture     Problem List There are no problems to display for this patient.   , PT 03/05/2020, 3:31 PM  [facility] [address] [place], [place], [place] Phone: [phone]   Fax:  [phone]  Name: Christina Hull MRN: Mariel Aloe Date of Birth: 11/18/1990

## 2020-03-10 ENCOUNTER — Ambulatory Visit: Payer: 59 | Admitting: Physical Therapy

## 2020-03-10 ENCOUNTER — Encounter: Payer: Self-pay | Admitting: Physical Therapy

## 2020-03-10 ENCOUNTER — Other Ambulatory Visit: Payer: Self-pay

## 2020-03-10 DIAGNOSIS — R293 Abnormal posture: Secondary | ICD-10-CM

## 2020-03-10 DIAGNOSIS — G8929 Other chronic pain: Secondary | ICD-10-CM

## 2020-03-10 DIAGNOSIS — M6281 Muscle weakness (generalized): Secondary | ICD-10-CM

## 2020-03-10 DIAGNOSIS — M25512 Pain in left shoulder: Secondary | ICD-10-CM

## 2020-03-10 NOTE — Therapy (Signed)
[facility] [address] [place], [place], [address] Phone: [phone]   Fax:  [phone]  Physical Therapy Treatment  Patient Details  Name: Christina Hull MRN: [id number] Date of Birth: June 13, 1990 Referring Provider (PT): Gregor Hams, MD   Encounter Date: 03/10/2020   PT End of Session - 03/10/20 1229    Visit Number 5    Date for PT Re-Evaluation 04/20/20    Authorization Type [employer]    PT Start Time 1230    PT Stop Time 1310    PT Time Calculation (min) 40 min    Activity Tolerance Patient tolerated treatment well    Behavior During Therapy [facility] for tasks assessed/performed           History reviewed. No pertinent past medical history.  History reviewed. No pertinent surgical history.  There were no vitals filed for this visit.   Subjective Assessment - 03/10/20 1235    Subjective I am doing world's better since starting PT and my water class.  I signed up for 5 days a week option at [facility] in Jan.  I cleaned for 1 hour yesterday without shoulder pain.    Limitations House hold activities;Lifting    Diagnostic tests Xrays and [place]: RC intact, mild subacromial bursitis    Patient Stated Goals be able to use Lt arm for more than 20 min, be able to lift    Currently in Pain? Yes    Pain Score 2     Pain Location Shoulder    Pain Orientation Left    Pain Descriptors / Indicators Aching    Pain Type Chronic pain    Pain Onset More than a month ago    Pain Frequency Intermittent    Aggravating Factors  reaching, eat the wrong thing                             [facility] Adult PT Treatment/Exercise - 03/10/20 0001      Exercises   Exercises Neck;Shoulder      Neck Exercises: Seated   Neck Retraction 5 reps    Cervical Rotation Both;5 reps    Cervical Rotation Limitations in retraction      Shoulder Exercises: Supine   Protraction Strengthening;Both;10 reps;Weights    Protraction  Weight (lbs) 3    Protraction Limitations dowel press with 3lb ankle weight    Horizontal ABduction Strengthening;Both;10 reps;Theraband    Theraband Level (Shoulder Horizontal ABduction) Level 1 (Yellow)    External Rotation Strengthening;Both;10 reps;Theraband    Theraband Level (Shoulder External Rotation) Level 1 (Yellow)    Flexion Strengthening;Both;10 reps;Weights    Flexion Limitations bil dowel AA/ROM with 3lb ankle weight, Lt only 1lb x 10    Diagonals Strengthening;Both;Theraband;10 reps    Theraband Level (Shoulder Diagonals) Level 1 (Yellow)    Diagonals Limitations draw the sword    Other Supine Exercises chest press dowel + 3lb x 10    Other Supine Exercises small circle 1lb 2x10 each way      Shoulder Exercises: Seated   Extension Strengthening;Both;10 reps;Theraband    Theraband Level (Shoulder Extension) Level 1 (Yellow)    Row Strengthening;Both;10 reps;Theraband    Theraband Level (Shoulder Row) Level 1 (Yellow)      Shoulder Exercises: Sidelying   External Rotation Strengthening;Left;10 reps;Weights    External Rotation Weight (lbs) 1    ABduction AROM;Left;10 reps    ABduction Limitations PT provided  TCs at scapula for control      Shoulder Exercises: ROM/Strengthening   Ball on Wall pball roll up wall 20x, finishing 1x10 each with overhead single arm flexion end range reach Rt/Lt    Other ROM/Strengthening Exercises finger ladder with lift off and lower eccentric phase - 10 reps                    PT Short Term Goals - 03/10/20 1304      PT SHORT TERM GOAL #1   Title pt will be ind with initial HEP    Status Achieved      PT SHORT TERM GOAL #2   Title Pt will report reduced Lt shoulder pain by at least 20% with ADLs and light household tasks.    Status Achieved      PT SHORT TERM GOAL #3   Title Pt will demo improved scapular strength of at least 3+/5 for functional reaching    Baseline 4/5 MMT Lt shoulder/scap    Status Achieved      PT  SHORT TERM GOAL #4   Title Pt will be able to perform A/ROM of Lt shoulder flexion and scaption with min pain to at least 140 deg    Status Achieved             PT Long Term Goals - 03/10/20 1306      PT LONG TERM GOAL #1   Title Ind with advanced HEP and understand how to safely progress.    Status On-going      PT LONG TERM GOAL #2   Title Pt will be able to perform ADLs and light household tasks for at least 30 min with minimal or no pain/fatigue.    Status On-going      PT LONG TERM GOAL #3   Title Lt shoulder girdle strength of at least 4/5 for improved reaching, lifting and carrying    Status On-going      PT LONG TERM GOAL #4   Title Pt will achieve Lt shoulder flexion of at least 150 deg for improved overhead tasks.    Status Achieved      PT LONG TERM GOAL #5   Title Reduce FOTO to </= 29% to demo less limitation    Baseline 49%    Status New                 Plan - 03/10/20 1311    Clinical Impression Statement Pt has met all STGs and met a LTG today for Lt shoulder flexion.  Her Lt shoulder ROM is signif improved.  She arrived with 1-2/10 pain and reported she was able to clean x 1 hour yesterday without Lt shoulder pain.  Pt is now progressing strength of Lt shoulder girdle and continues to have improved pain control and tolerance of ther ex.  PT progressed ther ex to include seated yellow shoulder ext and row.  She needed minor cues to maintain neck retraction with this.  She is able to achieve full AA/ROM with Lt UE with ball on wall rollups and finger ladder.  She demo'd excellent eccentric control with A/ROM lowering from finger ladder lift offs today.  Pt plans to [facility] in Jan as she feels it is helping her overall pain and mental health.  Continue along POC progressing toward remaining LTGs.    Rehab Potential Good    PT Frequency 1x / week  PT Duration 12 weeks    PT Treatment/Interventions ADLs/Self  Care Home Management;Electrical Stimulation;Cryotherapy;Moist Heat;Iontophoresis 40m/ml Dexamethasone;Neuromuscular re-education;Therapeutic exercise;Therapeutic activities;Functional mobility training;Manual techniques;Patient/family education;Taping;Passive range of motion;Joint Manipulations;Spinal Manipulations    PT Next Visit Plan RTC and scap strengthening; f/u on supine, SL and seated ther ex, progress as tolerated, maybe try short bout on UBE    PT Home Exercise Plan Access Code: [email or web address]    Consulted and Agree with Plan of Care Patient           Patient will benefit from skilled therapeutic intervention in order to improve the following deficits and impairments:     Visit Diagnosis: Chronic left shoulder pain  Muscle weakness (generalized)  Abnormal posture     Problem List There are no problems to display for this patient.   JVenetia NightBeuhring, PT 03/10/20 1:16 PM   [facility] [address] [place] [address]Phone: [phone]  Fax:  [phone] Name: Christina WHITEMANMRN: [id number] of Birth: [birth year]

## 2020-03-17 ENCOUNTER — Encounter: Payer: 59 | Admitting: Physical Therapy

## 2020-03-23 ENCOUNTER — Ambulatory Visit: Payer: 59 | Admitting: Family Medicine

## 2020-03-24 ENCOUNTER — Encounter: Payer: 59 | Admitting: Physical Therapy

## 2020-03-26 ENCOUNTER — Encounter: Payer: 59 | Admitting: Physical Therapy

## 2020-04-08 ENCOUNTER — Ambulatory Visit: Payer: 59 | Attending: Family Medicine | Admitting: Physical Therapy

## 2020-04-08 ENCOUNTER — Other Ambulatory Visit: Payer: Self-pay

## 2020-04-08 ENCOUNTER — Encounter: Payer: Self-pay | Admitting: Physical Therapy

## 2020-04-08 DIAGNOSIS — M25512 Pain in left shoulder: Secondary | ICD-10-CM | POA: Diagnosis not present

## 2020-04-08 DIAGNOSIS — M6281 Muscle weakness (generalized): Secondary | ICD-10-CM

## 2020-04-08 DIAGNOSIS — R293 Abnormal posture: Secondary | ICD-10-CM | POA: Diagnosis present

## 2020-04-08 DIAGNOSIS — G8929 Other chronic pain: Secondary | ICD-10-CM | POA: Diagnosis present

## 2020-04-08 NOTE — Patient Instructions (Signed)
Access Code: [email or web address] URL: [email or web address] Date: 04/08/2020 Prepared by: Loistine Simas Daysha Ashmore  Exercises Supine Shoulder Flexion Extension AAROM with Dowel - 1 x daily - 7 x weekly - 1 sets - 10 reps Supine Cervical Retraction with Towel - 1 x daily - 7 x weekly - 1 sets - 10 reps - 5 hold Seated Scapular Retraction - 1 x daily - 7 x weekly - 1 sets - 10 reps Doorway Pec Stretch at 90 Degrees Abduction - 1 x daily - 7 x weekly - 1 sets - 2 reps - 20 hold Seated Cervical Sidebending Stretch - 1 x daily - 7 x weekly - 3 sets - 10 reps Supine Shoulder External Rotation with Resistance - 1 x daily - 7 x weekly - 3 sets - 10 reps Supine Scapular Protraction in Flexion with Dumbbells - 1 x daily - 7 x weekly - 3 sets - 10 reps Standing Shoulder Horizontal Abduction with Resistance - 1 x daily - 7 x weekly - 2 sets - 10 reps Standing Row with Anchored Resistance - 1 x daily - 7 x weekly - 3 sets - 10 reps Standing Shoulder Extension with Resistance - 1 x daily - 7 x weekly - 3 sets - 10 reps Standing Bent Over Shoulder Row with Resistance - 1 x daily - 7 x weekly - 3 sets - 10 reps Scaption Wall Slide with Towel - 1 x daily - 7 x weekly - 3 sets - 10 reps  Patient Education Office Posture

## 2020-04-08 NOTE — Therapy (Signed)
[facility] [address], [address] [place], [place], [address] Phone: [phone]   Fax:  [phone]  Physical Therapy Treatment  Patient Details  Name: Christina Hull MRN: [id number] Date of Birth: 1990/05/16 Referring Provider (PT): Rodolph Bong, MD   Encounter Date: 04/08/2020   PT End of Session - 04/08/20 0846    Visit Number 6    Date for PT Re-Evaluation 04/20/20    Authorization Type [employer]    PT Start Time 0800    PT Stop Time 0840    PT Time Calculation (min) 40 min    Activity Tolerance Patient tolerated treatment well    Behavior During Therapy [facility] for tasks assessed/performed           History reviewed. No pertinent past medical history.  History reviewed. No pertinent surgical history.  There were no vitals filed for this visit.   Subjective Assessment - 04/08/20 0805    Subjective I am doing about the same, maybe a little regressed.  I get some fatigue and pain in Lt shoulder with activity.  My yellow band broke.    Limitations House hold activities;Lifting    Diagnostic tests Xrays and [place]: RC intact, mild subacromial bursitis    Patient Stated Goals be able to use Lt arm for more than 20 min, be able to lift    Currently in Pain? Yes    Pain Score 2    Lt shoulder can get to a 5/10 with activity   Pain Location Shoulder    Pain Orientation Left    Pain Descriptors / Indicators Aching    Pain Type Chronic pain    Pain Onset More than a month ago    Pain Frequency Intermittent    Aggravating Factors  laundry, cooking, sitting on couch    Pain Relieving Factors rest, heat, aquatic exercise                             [facility] Adult PT Treatment/Exercise - 04/08/20 0001      Therapeutic Activites    Therapeutic Activities Other Therapeutic Activities    Other Therapeutic Activities functional squat and bend with proper neck retraction and scapular support, bent over row with  yellow band x 10 with neck retraction      Exercises   Exercises Shoulder;Neck      Neck Exercises: Stabilization   Stabilization neck retraction with wall slide 1lb press scaption Rt and Lt 1x10      Shoulder Exercises: Standing   Horizontal ABduction Strengthening;Both;10 reps;Theraband    Theraband Level (Shoulder Horizontal ABduction) Level 2 (Red)    External Rotation Strengthening;Both;15 reps;Theraband    Theraband Level (Shoulder External Rotation) Level 2 (Red)    Extension Strengthening;Both;10 reps;Theraband    Theraband Level (Shoulder Extension) Level 2 (Red)    Row Strengthening;Both;15 reps;Theraband    Theraband Level (Shoulder Row) Level 2 (Red)    Other Standing Exercises scaption wall slide press 1lb ankle weight against wall 1x10 each UE      Shoulder Exercises: ROM/Strengthening   Nustep L2 x 4' seat 7 PT present to discuss symptoms      Manual Therapy   Manual Therapy Soft tissue mobilization    Soft tissue mobilization Lt upper trap, pectorals on Lt                  PT Education - 04/08/20 8466  Education Details Access Code: [email or web address]    Person(s) Educated Patient    Methods Explanation;Handout;Verbal cues;Tactile cues    Comprehension Verbalized understanding;Returned demonstration            PT Short Term Goals - 03/10/20 1304      PT SHORT TERM GOAL #1   Title pt will be ind with initial HEP    Status Achieved      PT SHORT TERM GOAL #2   Title Pt will report reduced Lt shoulder pain by at least 20% with ADLs and light household tasks.    Status Achieved      PT SHORT TERM GOAL #3   Title Pt will demo improved scapular strength of at least 3+/5 for functional reaching    Baseline 4/5 MMT Lt shoulder/scap    Status Achieved      PT SHORT TERM GOAL #4   Title Pt will be able to perform A/ROM of Lt shoulder flexion and scaption with min pain to at least 140 deg    Status Achieved             PT Long Term Goals - 03/10/20  1306      PT LONG TERM GOAL #1   Title Ind with advanced HEP and understand how to safely progress.    Status On-going      PT LONG TERM GOAL #2   Title Pt will be able to perform ADLs and light household tasks for at least 30 min with minimal or no pain/fatigue.    Status On-going      PT LONG TERM GOAL #3   Title Lt shoulder girdle strength of at least 4/5 for improved reaching, lifting and carrying    Status On-going      PT LONG TERM GOAL #4   Title Pt will achieve Lt shoulder flexion of at least 150 deg for improved overhead tasks.    Status Achieved      PT LONG TERM GOAL #5   Title Reduce FOTO to </= 29% to demo less limitation    Baseline 49%    Status New                 Plan - 04/08/20 0850    Clinical Impression Statement Pt missed 2 weeks of PT due to covid exposure. She reported mild pain at base of neck, worse with laundry, cooking, sitting on couch and lifting her 30lb niece.  She needed cues for improved neck retraction today for C6 and C7 to stack over T1 out of forward head.  She was nodding vs retracting.  Pt was able to progress most ther ex from supine to standing and from yellow to red today with improved neck retraction throughout.  PT reviewed ther activities including squat, bend, lift with neck retraction.  PT added overhead wall slide into scaption bil UEs and bent over bil UE row with yellow band to HEP today with good tolerance and improved form.  ERO next visit with likely extension to progress strength and neuro re-ed applicaiton for functional activity.    Comorbidities chronic pain    Examination-Activity Limitations Lift;Bend;Reach Overhead;Dressing;Carry;Caring for Others    Examination-Participation Restrictions Community Activity;Shop;Meal Prep;Cleaning    Rehab Potential Good    PT Frequency 1x / week    PT Duration 12 weeks    PT Treatment/Interventions ADLs/Self Care Home Management;Electrical Stimulation;Cryotherapy;Moist  Heat;Iontophoresis 4mg /ml Dexamethasone;Neuromuscular re-education;Therapeutic exercise;Therapeutic activities;Functional mobility training;Manual techniques;Patient/family education;Taping;Passive range of motion;Joint Manipulations;Spinal Manipulations  PT Next Visit Plan ERO with likely extension since missed visits    PT Home Exercise Plan Access Code: [email or web address]    Consulted and Agree with Plan of Care Patient           Patient will benefit from skilled therapeutic intervention in order to improve the following deficits and impairments:  Decreased range of motion,Pain,Postural dysfunction,Decreased strength,Decreased mobility,Impaired UE functional use,Decreased activity tolerance,Improper body mechanics  Visit Diagnosis: Chronic left shoulder pain  Muscle weakness (generalized)  Abnormal posture     Problem List There are no problems to display for this patient.   Loistine Simas Beuhring, PT 04/08/20 9:35 AM   [facility] [address], [address] [place], [place], [address] Phone: [phone]   Fax:  [phone]  Name: Christina Hull MRN: [id number] Date of Birth: January 02, 1991

## 2020-04-11 ENCOUNTER — Encounter (INDEPENDENT_AMBULATORY_CARE_PROVIDER_SITE_OTHER): Payer: Self-pay

## 2020-04-13 ENCOUNTER — Ambulatory Visit: Payer: 59 | Admitting: Physical Therapy

## 2020-04-13 ENCOUNTER — Encounter: Payer: Self-pay | Admitting: Physical Therapy

## 2020-04-13 ENCOUNTER — Other Ambulatory Visit: Payer: Self-pay

## 2020-04-13 DIAGNOSIS — M6281 Muscle weakness (generalized): Secondary | ICD-10-CM

## 2020-04-13 DIAGNOSIS — M25512 Pain in left shoulder: Secondary | ICD-10-CM | POA: Diagnosis not present

## 2020-04-13 DIAGNOSIS — R293 Abnormal posture: Secondary | ICD-10-CM

## 2020-04-13 DIAGNOSIS — G8929 Other chronic pain: Secondary | ICD-10-CM

## 2020-04-13 NOTE — Patient Instructions (Signed)
Access Code: [email or web address] URL: [email or web address] Date: 04/13/2020 Prepared by: Loistine Simas Donnette Macmullen  Exercises Supine Shoulder Flexion Extension AAROM with Dowel - 1 x daily - 7 x weekly - 1 sets - 10 reps Supine Cervical Retraction with Towel - 1 x daily - 7 x weekly - 1 sets - 10 reps - 5 hold Seated Scapular Retraction - 1 x daily - 7 x weekly - 1 sets - 10 reps Doorway Pec Stretch at 90 Degrees Abduction - 1 x daily - 7 x weekly - 1 sets - 2 reps - 20 hold Seated Cervical Sidebending Stretch - 1 x daily - 7 x weekly - 3 sets - 10 reps Supine Shoulder External Rotation with Resistance - 1 x daily - 7 x weekly - 3 sets - 10 reps Supine Scapular Protraction in Flexion with Dumbbells - 1 x daily - 7 x weekly - 3 sets - 10 reps Standing Shoulder Horizontal Abduction with Resistance - 1 x daily - 7 x weekly - 2 sets - 10 reps Standing Row with Anchored Resistance - 1 x daily - 7 x weekly - 3 sets - 10 reps Standing Shoulder Extension with Resistance - 1 x daily - 7 x weekly - 3 sets - 10 reps Standing Bent Over Shoulder Row with Resistance - 1 x daily - 7 x weekly - 3 sets - 10 reps Scaption Wall Slide with Towel - 1 x daily - 7 x weekly - 3 sets - 10 reps Seated Thoracic Flexion and Rotation with Arms Crossed - 1 x daily - 7 x weekly - 1 sets - 3 reps - 10 hold Single Arm Doorway Pec Stretch at 60 Elevation - 1 x daily - 7 x weekly - 1 sets - 3 reps - 10 hold  Patient Education Office Posture

## 2020-04-13 NOTE — Therapy (Signed)
[facility] [address], [place], [place], [address] Phone: [phone]   Fax:  [phone]  Physical Therapy Treatment  Patient Details  Name: MABELLE MUNGIN MRN: [id number] Date of Birth: Jun 09, 1990 Referring Provider (PT): Gregor Hams, MD   Encounter Date: 04/13/2020   PT End of Session - 04/13/20 0800    Visit Number 7    Date for PT Re-Evaluation 04/20/20    Authorization Type [employer]    PT Start Time 0800    PT Stop Time 0840    PT Time Calculation (min) 40 min    Activity Tolerance Patient tolerated treatment well    Behavior During Therapy [facility] for tasks assessed/performed           History reviewed. No pertinent past medical history.  History reviewed. No pertinent surgical history.  There were no vitals filed for this visit.   Subjective Assessment - 04/13/20 0802    Subjective I took care of my 30lb niece over the whole weekend with my husband and it was physically very hard.  I tried not to lift her too much but still siezed up and needed meds - I lost range of motion in my Lt shoulder but meds have helped.  I'm sore now though.  I did pay attention to my posture and others even noticed how improved it was.  I feel like I have good knowledge on how to improve my strength with my HEP.  I have ongoing discomfort/pain that hasn't changed since starting PT in the Lt chest.    Limitations House hold activities;Lifting    Diagnostic tests Xrays and [place]: RC intact, mild subacromial bursitis    Patient Stated Goals be able to use Lt arm for more than 20 min, be able to lift    Currently in Pain? Yes    Pain Score 5    up to 7/10 over the weekend   Pain Location Shoulder    Pain Orientation Left    Pain Descriptors / Indicators Sore    Pain Type Chronic pain    Pain Onset More than a month ago    Pain Frequency Intermittent    Aggravating Factors  lifting, laundry, cooking, sitting on couch    Pain  Relieving Factors rest, heat, aquatics and meds as needed              [facility] PT Assessment - 04/13/20 0001      Observation/Other Assessments   Focus on Therapeutic Outcomes (FOTO)  improved from 51% to 55%, still Stage 3/"fair shoulder function"      ROM / Strength   AROM / PROM / Strength AROM;PROM;Strength      AROM   AROM Assessment Site Shoulder;Thoracic    Left Shoulder Flexion 125 Degrees    Thoracic - Right Rotation limited with stretch vs pain in Lt chest    Thoracic - Left Rotation limited with recreation of Lt chest pain      PROM   PROM Assessment Site Shoulder    Right/Left Shoulder Left    Left Shoulder Flexion 150 Degrees   with Lt anterior chest pain     Strength   Overall Strength Comments scapular strength Lt 4-/5    Strength Assessment Site Shoulder    Right/Left Shoulder Left    Left Shoulder Flexion 4/5    Left Shoulder Extension 4+/5    Left Shoulder ABduction 4+/5    Left Shoulder Internal Rotation 4+/5  Left Shoulder External Rotation 4/5      Palpation   Palpation comment Lt intercostals T3-T5 and ribs T3-T5 at sternum                         [facility] Adult PT Treatment/Exercise - 04/13/20 0001      Self-Care   Self-Care Other Self-Care Comments    Other Self-Care Comments  breathwork with stretches for ribcage mobility      Exercises   Exercises Shoulder      Shoulder Exercises: Standing   Other Standing Exercises reviewed current HEP and how to progress to green band when ready    Other Standing Exercises seated thoracic rotation 3x10 with reach bil (HEP added)      Shoulder Exercises: Stretch   Other Shoulder Stretches Lt UE doorway stretch with trunk rotation to Rt for chest and intercostal stretch 2x20 sec                  PT Education - 04/13/20 0839    Education Details Access Code: [email or web address]    Person(s) Educated Patient    Methods Explanation;Demonstration;Handout    Comprehension Verbalized  understanding;Returned demonstration            PT Short Term Goals - 03/10/20 1304      PT SHORT TERM GOAL #1   Title pt will be ind with initial HEP    Status Achieved      PT SHORT TERM GOAL #2   Title Pt will report reduced Lt shoulder pain by at least 20% with ADLs and light household tasks.    Status Achieved      PT SHORT TERM GOAL #3   Title Pt will demo improved scapular strength of at least 3+/5 for functional reaching    Baseline 4/5 MMT Lt shoulder/scap    Status Achieved      PT SHORT TERM GOAL #4   Title Pt will be able to perform A/ROM of Lt shoulder flexion and scaption with min pain to at least 140 deg    Status Achieved             PT Long Term Goals - 04/13/20 0801      PT LONG TERM GOAL #1   Title Ind with advanced HEP and understand how to safely progress.    Baseline has progressed resistance and anti-gravity positions, working on tolerance with this    Status Partially Met      PT LONG TERM GOAL #2   Title Pt will be able to perform ADLs and light household tasks for at least 30 min with minimal or no pain/fatigue.    Baseline was there but had a set back when missed PT due to covid exposure    Status Partially Met      PT LONG TERM GOAL #3   Title Lt shoulder girdle strength of at least 4/5 for improved reaching, lifting and carrying    Status Partially Met      PT LONG TERM GOAL #4   Title Pt will achieve Lt shoulder flexion of at least 150 deg for improved overhead tasks.    Baseline 125 A/ROM, AA/ROM 150 with pain in anterior Lt chest    Status On-going      PT LONG TERM GOAL #5   Title Reduce FOTO to </= 29% to demo less limitation    Status Partially Met  Plan - 04/13/20 0944    Clinical Impression Statement Pt has met all STGs and partially met all LTGs.  FOTO score has improved from 51% to 55%, suggesting small improvement in shoulder function.  She has improved strength of Lt shoulder and scapular  stabilizers ranging in strength from 4/5 or 4+/5.  She has varying amounts of Lt shoulder flexion ROM depending on level of soreness, today measuring 125 A/ROM and 150 P/ROM.  She has been able to progress HEP to higher resistance band and in anti-gravity position.  She demos much improved postural awareness in both static and dynamic postures and functional movement patterns.  She had increased soreness over the weekend caring for her 30lb niece but this has improved back to baseline this morning.  She has consistent tenderness of Lt anterior chest along intercostals and ribs which appears to be myofasial in nature.  PT progressed ROM and stretches for this area with improved ROM into thoracic rotation with less pain following these new exercises today so these should help.  When given option to extend PT or try HEP independently, Pt wanted to d/c to HEP for now.  She will plan to follow up with MD if HEP does not yield improved pain and function as she gets stronger.    Comorbidities chronic pain    Rehab Potential Good    PT Frequency 1x / week    PT Duration 12 weeks    PT Treatment/Interventions ADLs/Self Care Home Management;Electrical Stimulation;Cryotherapy;Moist Heat;Iontophoresis 62m/ml Dexamethasone;Neuromuscular re-education;Therapeutic exercise;Therapeutic activities;Functional mobility training;Manual techniques;Patient/family education;Taping;Passive range of motion;Joint Manipulations;Spinal Manipulations    PT Next Visit Plan d/c to HEP    PT Home Exercise Plan Access Code: [email or web address]    Consulted and Agree with Plan of Care Patient           Patient will benefit from skilled therapeutic intervention in order to improve the following deficits and impairments:     Visit Diagnosis: Chronic left shoulder pain  Muscle weakness (generalized)  Abnormal posture     Problem List There are no problems to display for this patient.   PHYSICAL THERAPY DISCHARGE SUMMARY  Visits from  Start of Care: 7  Current functional level related to goals / functional outcomes: See above   Remaining deficits: See above   Education / Equipment: HEP Plan: Patient agrees to discharge.  Patient goals were partially met. Patient is being discharged due to being pleased with the current functional level.  ?????         JBaruch Merl PT 04/13/20 9:54 AM    [facility] [address] [place] [address]Phone: [phone]  Fax:  [phone] Name: JRASHEIDA BRODENMRN: [id number] of Birth: 1Dec 27, 1992

## 2020-05-22 ENCOUNTER — Ambulatory Visit: Payer: 59 | Admitting: Family Medicine

## 2020-05-26 ENCOUNTER — Other Ambulatory Visit: Payer: Self-pay

## 2020-05-26 ENCOUNTER — Ambulatory Visit: Payer: 59 | Admitting: Family Medicine

## 2020-05-26 ENCOUNTER — Encounter: Payer: Self-pay | Admitting: Family Medicine

## 2020-05-26 VITALS — BP 105/69 | HR 70 | Temp 98.3°F | Ht 62.0 in | Wt 198.0 lb

## 2020-05-26 DIAGNOSIS — M25572 Pain in left ankle and joints of left foot: Secondary | ICD-10-CM

## 2020-05-26 DIAGNOSIS — M130 Polyarthritis, unspecified: Secondary | ICD-10-CM | POA: Diagnosis not present

## 2020-05-26 DIAGNOSIS — L4050 Arthropathic psoriasis, unspecified: Secondary | ICD-10-CM | POA: Insufficient documentation

## 2020-05-26 NOTE — Assessment & Plan Note (Signed)
Patient concerned about possible autoimmune or inflammatory condition.  Pain is still bothersome and not under adequate control.  We will check labs today to rule this out including CRP, sed rate, uric acid, ANA, and rheumatoid factor.  She does have a fair amount of edema and her left ankle when we will check plain films as these have not previously been imaged.  She has had x-rays her back and her shoulder all of which were normal recently.  May have some component of myofascial pain syndrome.  But we need to rule out above potential causes first.  It is reassuring that her symptoms have responded well to physical therapy and NSAIDs.  Depending on results may consider referral to rheumatology or back to sports medicine.  She has been taking prescription strength ibuprofen which has worked well and she will continue with this.  She has Zanaflex additionally which does not work as well.

## 2020-05-26 NOTE — Progress Notes (Signed)
   Christina Hull is a 30 y.o. female who presents today for an office visit.  Assessment/Plan:  Chronic Problems Addressed Today: Polyarthritis Patient concerned about possible autoimmune or inflammatory condition.  Pain is still bothersome and not under adequate control.  We will check labs today to rule this out including CRP, sed rate, uric acid, ANA, and rheumatoid factor.  She does have a fair amount of edema and her left ankle when we will check plain films as these have not previously been imaged.  She has had x-rays her back and her shoulder all of which were normal recently.  May have some component of myofascial pain syndrome.  But we need to rule out above potential causes first.  It is reassuring that her symptoms have responded well to physical therapy and NSAIDs.  Depending on results may consider referral to rheumatology or back to sports medicine.  She has been taking prescription strength ibuprofen which has worked well and she will continue with this.  She has Zanaflex additionally which does not work as well.     Subjective:  HPI:  Patient here with joint and muscle aches for the past 5 years.  Symptoms started about 5 years ago with pain in her left ankle.  Since then she has had migrating pain in many different sites of her body including left shoulder, base of neck, right mid back, left hip, and feet.  Most recently she has had more issues with pain in her left shoulder.  No obvious injuries or precipitating events.  She is seeing sports medicine who was concerned about possible rotator cuff tendinopathy.  She has been working with physical therapy which seems to work well.  She will occasionally take Advil which works very well to alleviate her pain.  She is concerned about possible inflammatory or autoimmune condition.  She additionally recently had some chest pain radiating into her left shoulder.  She went to the ED for this.  Had EKG done which was normal.  She has been  working with physical therapy which is helped alleviate some of her chest wall pain.  Pain comes and go.  PT seems to help a lot.  She has intermittent joint swelling and currently has some swelling in her left ankle.       Objective:  Physical Exam: BP 105/69   Pulse 70   Temp 98.3 F (36.8 C) (Temporal)   Ht 5\' 2"  (1.575 m)   Wt 198 lb (89.8 kg)   LMP 05/19/2020   SpO2 100%   BMI 36.21 kg/m   Gen: No acute distress, resting comfortably CV: Regular rate and rhythm with no murmurs appreciated Pulm: Normal work of breathing, clear to auscultation bilaterally with no crackles, wheezes, or rhonchi MSK: -Neck: No deformities.  Tender to palpation along left paraspinal cervical muscles -Arms: No deformities.  Neurovascular intact distally.  - Back: No deformities.  Tender palpation along right thoracic and lumbar paraspinal muscles -Lower extremities: No deformities.  Left lateral malleolus very tender to palpation.  Gross edema noted.  Neurovascular intact distally.  Strength out of 5 throughout. Neuro: Grossly normal, moves all extremities Psych: Normal affect and thought content      Keyetta Hollingworth M. 07/19/2020, MD 05/26/2020 12:41 PM

## 2020-05-26 NOTE — Patient Instructions (Signed)
It was very nice to see you today!    We will check blood work and x-rays today.  We are looking for any possible autoimmune or inflammatory conditions that could cause her symptoms.  Depending on the results we can discuss different treatments or further testing.  Take care, Dr Jimmey Ralph  PLEASE NOTE:  If you had any lab tests please let us know if you have not heard back within a few days. You may see your results on mychart before we have a chance to review them but we will give you a call once they are reviewed by [place]. If we ordered any referrals today, please let us know if you have not heard from their office within the next week.   Please try these tips to maintain a healthy lifestyle:   Eat at least 3 REAL meals and 1-2 snacks per day.  Aim for no more than 5 hours between eating.  If you eat breakfast, please do so within one hour of getting up.    Each meal should contain half fruits/vegetables, one quarter protein, and one quarter carbs (no bigger than a computer mouse)   Cut down on sweet beverages. This includes juice, soda, and sweet tea.     Drink at least 1 glass of water with each meal and aim for at least 8 glasses per day   Exercise at least 150 minutes every week.    Myofascial Pain Syndrome and Fibromyalgia Myofascial pain syndrome and fibromyalgia are both pain disorders. This pain may be felt mainly in your muscles.  Myofascial pain syndrome: ? Always has tender points in the muscle that will cause pain when pressed (trigger points). The pain may come and go. ? Usually affects your neck, upper back, and shoulder areas. The pain often radiates into your arms and hands.  Fibromyalgia: ? Has muscle pains and tenderness that come and go. ? Is often associated with fatigue and sleep problems. ? Has trigger points. ? Tends to be long-lasting (chronic), but is not life-threatening. Fibromyalgia and myofascial pain syndrome are not the same. However, they  often occur together. If you have both conditions, each can make the other worse. Both are common and can cause enough pain and fatigue to make day-to-day activities difficult. Both can be hard to diagnose because their symptoms are common in many other conditions. What are the causes? The exact causes of these conditions are not known. What increases the risk? You are more likely to develop this condition if:  You have a family history of the condition.  You have certain triggers, such as: ? Spine disorders. ? An injury (trauma) or other physical stressors. ? Being under a lot of stress. ? Medical conditions such as osteoarthritis, rheumatoid arthritis, or lupus. What are the signs or symptoms? Fibromyalgia The main symptom of fibromyalgia is widespread pain and tenderness in your muscles. Pain is sometimes described as stabbing, shooting, or burning. You may also have:  Tingling or numbness.  Sleep problems and fatigue.  Problems with attention and concentration (fibro fog). Other symptoms may include:  Bowel and bladder problems.  Headaches.  Visual problems.  Problems with odors and noises.  Depression or mood changes.  Painful menstrual periods (dysmenorrhea).  Dry skin or eyes. These symptoms can vary over time. Myofascial pain syndrome Symptoms of myofascial pain syndrome include:  Tight, ropy bands of muscle.  Uncomfortable sensations in muscle areas. These may include aching, cramping, burning, numbness, tingling, and weakness.  Difficulty moving  certain parts of the body freely (poor range of motion). How is this diagnosed? This condition may be diagnosed by your symptoms and medical history. You will also have a physical exam. In general:  Fibromyalgia is diagnosed if you have pain, fatigue, and other symptoms for more than 3 months, and symptoms cannot be explained by another condition.  Myofascial pain syndrome is diagnosed if you have trigger points  in your muscles, and those trigger points are tender and cause pain elsewhere in your body (referred pain). How is this treated? Treatment for these conditions depends on the type that you have.  For fibromyalgia: ? Pain medicines, such as NSAIDs. ? Medicines for treating depression. ? Medicines for treating seizures. ? Medicines that relax the muscles.  For myofascial pain: ? Pain medicines, such as NSAIDs. ? Cooling and stretching of muscles. ? Trigger point injections. ? Sound wave (ultrasound) treatments to stimulate muscles. Treating these conditions often requires a team of health care providers. These may include:  Your primary care provider.  Physical therapist.  Complementary health care providers, such as massage therapists or acupuncturists.  Psychiatrist for cognitive behavioral therapy.   Follow these instructions at home: Medicines  Take over-the-counter and prescription medicines only as told by your health care provider.  Do not drive or use heavy machinery while taking prescription pain medicine.  If you are taking prescription pain medicine, take actions to prevent or treat constipation. Your health care provider may recommend that you: ? Drink enough fluid to keep your urine pale yellow. ? Eat foods that are high in fiber, such as fresh fruits and vegetables, whole grains, and beans. ? Limit foods that are high in fat and processed sugars, such as fried or sweet foods. ? Take an over-the-counter or prescription medicine for constipation. Lifestyle  Exercise as directed by your health care provider or physical therapist.  Practice relaxation techniques to control your stress. You may want to try: ? Biofeedback. ? Visual imagery. ? Hypnosis. ? Muscle relaxation. ? Yoga. ? Meditation.  Maintain a healthy lifestyle. This includes eating a healthy diet and getting enough sleep.  Do not use any products that contain nicotine or tobacco, such as  cigarettes and e-cigarettes. If you need help quitting, ask your health care provider.   General instructions  Talk to your health care provider about complementary treatments, such as acupuncture or massage.  Consider joining a support group with others who are diagnosed with this condition.  Do not do activities that stress or strain your muscles. This includes repetitive motions and heavy lifting.  Keep all follow-up visits as told by your health care provider. This is important. Where to find more information  National Fibromyalgia Association: www.fmaware.org  Arthritis Foundation: www.arthritis.org  American Chronic Pain Association: www.theacpa.org Contact a health care provider if:  You have new symptoms.  Your symptoms get worse or your pain is severe.  You have side effects from your medicines.  You have trouble sleeping.  Your condition is causing depression or anxiety. Summary  Myofascial pain syndrome and fibromyalgia are pain disorders.  Myofascial pain syndrome has tender points in the muscle that will cause pain when pressed (trigger points). Fibromyalgia also has muscle pains and tenderness that come and go, but this condition is often associated with fatigue and sleep disturbances.  Fibromyalgia and myofascial pain syndrome are not the same but often occur together, causing pain and fatigue that make day-to-day activities difficult.  Treatment for fibromyalgia includes taking medicines to relax  the muscles and medicines for pain, depression, or seizures. Treatment for myofascial pain syndrome includes taking medicines for pain, cooling and stretching of muscles, and injecting medicines into trigger points.  Follow your health care provider's instructions for taking medicines and maintaining a healthy lifestyle. This information is not intended to replace advice given to you by your health care provider. Make sure you discuss any questions you have with your  health care provider. Document Revised: 06/22/2018 Document Reviewed: 03/15/2017 Elsevier Patient Education  2021 [employer].

## 2020-05-27 ENCOUNTER — Other Ambulatory Visit: Payer: 59

## 2020-05-27 ENCOUNTER — Ambulatory Visit (INDEPENDENT_AMBULATORY_CARE_PROVIDER_SITE_OTHER): Payer: 59

## 2020-05-27 DIAGNOSIS — M130 Polyarthritis, unspecified: Secondary | ICD-10-CM

## 2020-05-27 DIAGNOSIS — M25572 Pain in left ankle and joints of left foot: Secondary | ICD-10-CM

## 2020-05-27 LAB — CBC
HCT: 40.5 % (ref 36.0–46.0)
Hemoglobin: 13.9 g/dL (ref 12.0–15.0)
MCHC: 34.3 g/dL (ref 30.0–36.0)
MCV: 87.2 fl (ref 78.0–100.0)
Platelets: 296 10*3/uL (ref 150.0–400.0)
RBC: 4.64 Mil/uL (ref 3.87–5.11)
RDW: 13.3 % (ref 11.5–15.5)
WBC: 5 10*3/uL (ref 4.0–10.5)

## 2020-05-27 LAB — COMPREHENSIVE METABOLIC PANEL
ALT: 9 U/L (ref 0–35)
AST: 13 U/L (ref 0–37)
Albumin: 4.3 g/dL (ref 3.5–5.2)
Alkaline Phosphatase: 75 U/L (ref 39–117)
BUN: 13 mg/dL (ref 6–23)
CO2: 29 mEq/L (ref 19–32)
Calcium: 9.3 mg/dL (ref 8.4–10.5)
Chloride: 101 mEq/L (ref 96–112)
Creatinine, Ser: 0.67 mg/dL (ref 0.40–1.20)
GFR: 119.06 mL/min (ref >60.00)
Glucose, Bld: 96 mg/dL (ref 70–99)
Potassium: 4.2 mEq/L (ref 3.5–5.1)
Sodium: 136 mEq/L (ref 135–145)
Total Bilirubin: 0.5 mg/dL (ref 0.2–1.2)
Total Protein: 6.8 g/dL (ref 6.0–8.3)

## 2020-05-27 LAB — TSH: TSH: 2.19 u[IU]/mL (ref 0.35–4.50)

## 2020-05-27 LAB — SEDIMENTATION RATE: Sed Rate: 7 mm/hr (ref 0–20)

## 2020-05-27 LAB — URIC ACID: Uric Acid, Serum: 4 mg/dL (ref 2.4–7.0)

## 2020-05-27 LAB — CK: Total CK: 50 U/L (ref 7–177)

## 2020-05-27 LAB — C-REACTIVE PROTEIN: CRP: 1.1 mg/dL (ref 0.5–20.0)

## 2020-05-28 LAB — RHEUMATOID FACTOR: Rheumatoid fact SerPl-aCnc: <14 IU/mL (ref <14)

## 2020-05-28 LAB — ANA: Anti Nuclear Antibody (ANA): NEGATIVE

## 2020-05-29 NOTE — Progress Notes (Signed)
Please inform patient of the following:  Labs and xrays are all within normal limites. Recommend sports medicine referral if she is interested to discuss management.  Christina Hull. Jimmey Ralph, MD 05/29/2020 12:48 PM

## 2020-10-27 ENCOUNTER — Encounter: Payer: Self-pay | Admitting: Family Medicine

## 2020-10-27 ENCOUNTER — Ambulatory Visit: Payer: 59 | Admitting: Family Medicine

## 2020-10-27 ENCOUNTER — Other Ambulatory Visit: Payer: Self-pay

## 2020-10-27 VITALS — BP 111/75 | HR 80 | Temp 98.5°F | Ht 62.0 in | Wt 204.6 lb

## 2020-10-27 DIAGNOSIS — M130 Polyarthritis, unspecified: Secondary | ICD-10-CM | POA: Diagnosis not present

## 2020-10-27 DIAGNOSIS — F419 Anxiety disorder, unspecified: Secondary | ICD-10-CM | POA: Insufficient documentation

## 2020-10-27 DIAGNOSIS — Z23 Encounter for immunization: Secondary | ICD-10-CM

## 2020-10-27 MED ORDER — TIZANIDINE HCL 4 MG PO TABS: 4.0000 mg | ORAL_TABLET | Freq: Four times a day (QID) | ORAL | 1 refills | Status: DC | PRN

## 2020-10-27 MED ORDER — DULOXETINE HCL 30 MG PO CPEP: 30.0000 mg | ORAL_CAPSULE | Freq: Every day | ORAL | 3 refills | Status: DC

## 2020-10-27 NOTE — Assessment & Plan Note (Signed)
Has had complete rheumatologic work-up which was negative.  She has also had multiple plain films without obvious explanation.  She may have some underlying myofascial pain syndrome.  We will be starting Cymbalta as above.  I will also refill her Zanaflex.  She has been using Advil as needed we will continue this.  If symptoms continue to be bothersome she will need to follow back up with sports medicine.

## 2020-10-27 NOTE — Addendum Note (Signed)
Addended by: Dyann Kief on: 10/27/2020 02:29 PM   Modules accepted: Orders

## 2020-10-27 NOTE — Progress Notes (Signed)
   Christina Hull is a 29 y.o. female who presents today for an office visit.  Assessment/Plan:  C hronic Problems Addressed Today: Anxiety Discussed treatment options.  Given her issues with chronic pain and possible underlying myofascial pain syndrome we decided to start Cymbalta 30 mg daily.  She will check with me in a few weeks via MyChart.  We will titrate dose as needed.  Would consider trial of 1 or 2 other SNRIs depending on response to Cymbalta.  Polyarthritis Has had complete rheumatologic work-up which was negative.  She has also had multiple plain films without obvious explanation.  She may have some underlying myofascial pain syndrome.  We will be starting Cymbalta as above.  I will also refill her Zanaflex.  She has been using Advil as needed we will continue this.  If symptoms continue to be bothersome she will need to follow back up with sports medicine.      Subjective:  HPI:  Patient here for follow-up.  Last seen 5 months ago as new patient.  We did rheumatologic work-up at that time for polyarthralgia which was negative.  Since her last visit she has had some improvement in her shoulder pain however still has persistent pain in her back and hips.  She will occasionally get a cramping sensation in her left flank that radiates into her left abdomen.  This can occur with certain movements or when sitting for prolonged periods of time.  She has seen sports medicine in the past.  She has been working with water therapy which seems to be helping.  She also takes Zanaflex and ibuprofen occasionally which works really well for her.  Is also concerned about anxiety.  This has been an ongoing issue for her.  She is currently seeing a therapist.  She feels like she is currently plateaued and her levels of anxiety and is interested in possibly starting medication to help with this.  She has never been on medications in the past for anxiety.       Objective:  Physical Exam: BP  111/75   Pulse 80   Temp 98.5 F (36.9 C) (Temporal)   Ht 5\' 2"  (1.575 m)   Wt 204 lb 9.6 oz (92.8 kg)   LMP 09/25/2020   SpO2 98%   BMI 37.42 kg/m   Gen: No acute distress, resting comfortably CV: Regular rate and rhythm with no murmurs appreciated Pulm: Normal work of breathing, clear to auscultation bilaterally with no crackles, wheezes, or rhonchi MSK: Back: No deformities.  Some tenderness palpation along left lateral ribs. Neuro: Grossly normal, moves all extremities Psych: Normal affect and thought content      Christina Hull M. 09/27/2020, MD 10/27/2020 2:14 PM

## 2020-10-27 NOTE — Assessment & Plan Note (Signed)
Discussed treatment options.  Given her issues with chronic pain and possible underlying myofascial pain syndrome we decided to start Cymbalta 30 mg daily.  She will check with me in a few weeks via MyChart.  We will titrate dose as needed.  Would consider trial of 1 or 2 other SNRIs depending on response to Cymbalta.

## 2020-10-27 NOTE — Patient Instructions (Signed)
It was very nice to see you today!  Please start the Cymbalta.  This should help with your anxiety and also help some with the pain.  Let us know how things are going in a few weeks.  We will probably need to get you back into see Dr. Denyse Amass to help with your pain.  Take care, Dr Jimmey Ralph  PLEASE NOTE:  If you had any lab tests please let us know if you have not heard back within a few days. You may see your results on mychart before we have a chance to review them but we will give you a call once they are reviewed by [place]. If we ordered any referrals today, please let us know if you have not heard from their office within the next week.   Please try these tips to maintain a healthy lifestyle:  Eat at least 3 REAL meals and 1-2 snacks per day.  Aim for no more than 5 hours between eating.  If you eat breakfast, please do so within one hour of getting up.   Each meal should contain half fruits/vegetables, one quarter protein, and one quarter carbs (no bigger than a computer mouse)  Cut down on sweet beverages. This includes juice, soda, and sweet tea.   Drink at least 1 glass of water with each meal and aim for at least 8 glasses per day  Exercise at least 150 minutes every week.

## 2020-12-02 ENCOUNTER — Telehealth: Payer: Self-pay

## 2020-12-02 NOTE — Telephone Encounter (Signed)
Pt called to give an update since starting Duloxetine. She stated that she still gets some tightness and soreness. Kanaya stated that she has not had any anxiety attacks since starting the medication. She stated that she still has muscle weakness as well but she believes she is getting better. Please Advise.

## 2020-12-03 NOTE — Telephone Encounter (Signed)
See note

## 2020-12-04 NOTE — Telephone Encounter (Signed)
I appreciate the update. I am glad that it seems to be helping. We can try increasing her dose to 60mg  daily if she wishes. I would like to see her back for an office visit in 3-6 months.  . Katina Degree, MD 12/04/2020 2:43 PM

## 2020-12-04 NOTE — Telephone Encounter (Signed)
Patient aware, will increase medication. Will call if not changes  Advise to return to clinic in 2-month

## 2020-12-21 ENCOUNTER — Other Ambulatory Visit: Payer: Self-pay | Admitting: Family Medicine

## 2021-02-26 ENCOUNTER — Telehealth: Payer: Self-pay

## 2021-02-26 ENCOUNTER — Encounter: Payer: Self-pay | Admitting: Family Medicine

## 2021-02-26 ENCOUNTER — Telehealth (INDEPENDENT_AMBULATORY_CARE_PROVIDER_SITE_OTHER): Payer: 59 | Admitting: Family Medicine

## 2021-02-26 VITALS — Temp 101.0°F

## 2021-02-26 DIAGNOSIS — B349 Viral infection, unspecified: Secondary | ICD-10-CM

## 2021-02-26 MED ORDER — BENZONATATE 200 MG PO CAPS: 200.0000 mg | ORAL_CAPSULE | Freq: Two times a day (BID) | ORAL | 0 refills | Status: DC | PRN

## 2021-02-26 NOTE — Telephone Encounter (Signed)
Please see note and advise  

## 2021-02-26 NOTE — Telephone Encounter (Signed)
Patient is scheduled with Dr.kremer today  Nurse Assessment Nurse: Lexine Baton, RN, Belenda Cruise Date/Time Christina Hull Time): 02/26/2021 12:02:40 PM Confirm and document reason for call. If symptomatic, describe symptoms. ---Caller states she has a fever, stuffy nose, cough, headache, little lightheaded and hoarse. Does the patient have any new or worsening symptoms? ---Yes Will a triage be completed? ---Yes Related visit to physician within the last 2 weeks? ---No Does the PT have any chronic conditions? (i.e. diabetes, asthma, this includes High risk factors for pregnancy, etc.) ---No Is the patient pregnant or possibly pregnant? (Ask all females between the ages of 25-55) ---No Is this a behavioral health or substance abuse call? ---No  Guidelines Guideline Title Affirmed Question Affirmed Notes Nurse Date/Time (Eastern Time) Dizziness - Lightheadedness [1] MILD dizziness (e.g., walking normally)  [2] has NOT been evaluated by physician for this (Exception: dizziness caused by heat exposure, sudden standing, or poor fluid intake) Lexine Baton, RN, Belenda Cruise 02/26/2021 12:05:03 PM  02/26/2021 12:09:10 PM SEE PCP WITHIN 3 DAYS Yes Lexine Baton, RN, Belenda Cruise

## 2021-02-26 NOTE — Progress Notes (Signed)
Established Patient Office Visit  Subjective:  Patient ID: Christina Hull, female    DOB: 1990-07-11  Age: 30 y.o. MRN: [id number]  CC:  Chief Complaint  Patient presents with   URI    Pt c/o dizziness, lightheadedness, chills, cough, and congestion x1 day. At home covid test negative    HPI Christina Hull presents for 1 day history of chills, fever, malaise with fatigue, lightheadedness with headache. Complains of nasal congestion, post nasal drip and cough. Denies sore throat. Denies difficulty breathing or asthma history. No tobacco.  Negative Covid test. Concerned about high temp last night that was responsive to cough and cold medicine.   Past Medical History:  Diagnosis Date   PCOS (polycystic ovarian syndrome)     History reviewed. No pertinent surgical history.  Family History  Problem Relation Age of Onset   Cancer Other    Heart disease Other    Depression Mother    Cancer Mother    COPD Mother    Arthritis Mother    Bipolar disorder Mother    Cancer Paternal Grandmother     Social History   Socioeconomic History   Marital status: Married    Spouse name: Not on file   Number of children: Not on file   Years of education: Not on file   Highest education level: Not on file  Occupational History   Not on file  Tobacco Use   Smoking status: Not on file   Smokeless tobacco: Not on file  Substance and Sexual Activity   Alcohol use: Not on file   Drug use: Not on file   Sexual activity: Not on file  Other Topics Concern   Not on file  Social History Narrative   ** Merged History Encounter **       Social Determinants of Health   Financial Resource Strain: Not on file  Food Insecurity: Not on file  Transportation Needs: Not on file  Physical Activity: Not on file  Stress: Not on file  Social Connections: Not on file  Intimate Partner Violence: Not on file    Outpatient Medications Prior to Visit  Medication Sig Dispense Refill   DULoxetine  (CYMBALTA) 30 MG capsule Take 1 capsule (30 mg total) by mouth daily. 90 capsule 3   ibuprofen (ADVIL) 400 MG tablet Take 400 mg by mouth every 6 (six) hours as needed.     tiZANidine (ZANAFLEX) 4 MG tablet TAKE ONE TABLET BY MOUTH EVERY 6 HOURS AS NEEDED FOR MUSCLE SPASMS 30 tablet 1   No facility-administered medications prior to visit.    No Known Allergies  ROS Review of Systems  Constitutional:  Positive for chills, fatigue and fever. Negative for diaphoresis and unexpected weight change.  HENT:  Positive for congestion and postnasal drip. Negative for sore throat.   Eyes:  Negative for photophobia and visual disturbance.  Respiratory:  Positive for cough. Negative for shortness of breath and wheezing.   Cardiovascular: Negative.   Gastrointestinal:  Negative for nausea and vomiting.  Endocrine: Negative for polyphagia and polyuria.  Genitourinary: Negative.   Musculoskeletal:  Positive for myalgias. Negative for arthralgias.  Skin:  Negative for pallor and rash.  Neurological:  Positive for light-headedness and headaches. Negative for speech difficulty and weakness.     Objective:    Physical Exam Vitals and nursing note reviewed.  Constitutional:      General: She is not in acute distress.    Appearance: Normal appearance. She is  not ill-appearing, toxic-appearing or diaphoretic.  HENT:     Head: Normocephalic and atraumatic.  Eyes:     General: No scleral icterus.       Right eye: No discharge.        Left eye: No discharge.     Conjunctiva/sclera: Conjunctivae normal.  Pulmonary:     Effort: Pulmonary effort is normal.  Neurological:     Mental Status: She is alert and oriented to person, place, and time.  Psychiatric:        Mood and Affect: Mood normal.        Behavior: Behavior normal.    Temp (!) 101 F (38.3 C) (Oral) Comment: patient reported Wt Readings from Last 3 Encounters:  10/27/20 204 lb 9.6 oz (92.8 kg)  05/26/20 198 lb (89.8 kg)  01/21/20  198 lb (89.8 kg)     Health Maintenance Due  Topic Date Due   HIV Screening  Never done   Hepatitis C Screening  Never done   PAP-Cervical Cytology Screening  08/10/2019   PAP SMEAR-Modifier  08/10/2019   COVID-19 Vaccine (3 - Booster for Pfizer series) 09/09/2019   INFLUENZA VACCINE  10/12/2020    There are no preventive care reminders to display for this patient.  Lab Results  Component Value Date   TSH 2.19 05/27/2020   Lab Results  Component Value Date   WBC 5.0 05/27/2020   HGB 13.9 05/27/2020   HCT 40.5 05/27/2020   MCV 87.2 05/27/2020   PLT 296.0 05/27/2020   Lab Results  Component Value Date   NA 136 05/27/2020   K 4.2 05/27/2020   CO2 29 05/27/2020   GLUCOSE 96 05/27/2020   BUN 13 05/27/2020   CREATININE 0.67 05/27/2020   BILITOT 0.5 05/27/2020   ALKPHOS 75 05/27/2020   AST 13 05/27/2020   ALT 9 05/27/2020   PROT 6.8 05/27/2020   ALBUMIN 4.3 05/27/2020   CALCIUM 9.3 05/27/2020   ANIONGAP 14 12/29/2019   GFR 119.06 05/27/2020   No results found for: CHOL No results found for: HDL No results found for: LDLCALC No results found for: TRIG No results found for: CHOLHDL No results found for: HGBA1C    Assessment & Plan:   Problem List Items Addressed This Visit   None Visit Diagnoses     Viral syndrome    -  Primary   Relevant Medications   benzonatate (TESSALON) 200 MG capsule       Meds ordered this encounter  Medications   benzonatate (TESSALON) 200 MG capsule    Sig: Take 1 capsule (200 mg total) by mouth 2 (two) times daily as needed for cough.    Dispense:  20 capsule    Refill:  0    Follow-up: Return in about 5 days (around 03/03/2021), or if symptoms worsen or fail to improve.  Continue rest, isolation, Tylenol as needed for fever and myalgias.  Should be improving by first of the week, follow-up if not.  Libby Maw, MD

## 2021-03-11 ENCOUNTER — Ambulatory Visit (INDEPENDENT_AMBULATORY_CARE_PROVIDER_SITE_OTHER): Payer: 59 | Admitting: Family Medicine

## 2021-03-11 ENCOUNTER — Encounter: Payer: Self-pay | Admitting: Family Medicine

## 2021-03-11 ENCOUNTER — Other Ambulatory Visit: Payer: Self-pay

## 2021-03-11 VITALS — BP 110/70 | HR 91 | Temp 96.6°F | Ht 62.0 in | Wt 207.4 lb

## 2021-03-11 DIAGNOSIS — R739 Hyperglycemia, unspecified: Secondary | ICD-10-CM

## 2021-03-11 DIAGNOSIS — M130 Polyarthritis, unspecified: Secondary | ICD-10-CM

## 2021-03-11 DIAGNOSIS — L409 Psoriasis, unspecified: Secondary | ICD-10-CM

## 2021-03-11 DIAGNOSIS — F419 Anxiety disorder, unspecified: Secondary | ICD-10-CM

## 2021-03-11 DIAGNOSIS — Z13220 Encounter for screening for lipoid disorders: Secondary | ICD-10-CM

## 2021-03-11 DIAGNOSIS — Z0001 Encounter for general adult medical examination with abnormal findings: Secondary | ICD-10-CM | POA: Diagnosis not present

## 2021-03-11 NOTE — Progress Notes (Signed)
Chief Complaint:  Christina Hull is a 30 y.o. female who presents today for her annual comprehensive physical exam.    Assessment/Plan:  Chronic Problems Addressed Today: Psoriasis Patient diagnosed with psoriasis and seborrheic dermatitis.  She has several lesions on her scalp both of these diagnoses.  Has a prominent psoriatic lesion posterior to left auricle consistent with psoriasis.  Her psoriasis in and of itself is not particularly bothersome at this point and she does not wish to have further treatment however does raise possibility for possible underlying psoriatic arthritis.  We will pursue further work-up as below.  Polyarthritis Symptoms are improving with Cymbalta.  We will continue current dose.  Discussed referral back to physical therapy however she would like to decline for now.  We will continue Zanaflex as needed.  Can consider increasing dose of Cymbalta if needed.  There is possibility for psoriatic arthritis given her concurrent psoriasis and distribution of her arthritic pain.  We will check additional labs and refer her to see rheumatology.  Anxiety Doing better with Cymbalta 30 mg daily.  She would like to continue current dose for now.  Preventative Healthcare: Will get blood work. Declined flu vaccine. Due for pap smear.   Patient Counseling(The following topics were reviewed and/or handout was given):  -Nutrition: Stressed importance of moderation in sodium/caffeine intake, saturated fat and cholesterol, caloric balance, sufficient intake of fresh fruits, vegetables, and fiber.  -Stressed the importance of regular exercise.   -Substance Abuse: Discussed cessation/primary prevention of tobacco, alcohol, or other drug use; driving or other dangerous activities under the influence; availability of treatment for abuse.   -Injury prevention: Discussed safety belts, safety helmets, smoke detector, smoking near bedding or upholstery.   -Sexuality: Discussed  sexually transmitted diseases, partner selection, use of condoms, avoidance of unintended pregnancy and contraceptive alternatives.   -Dental health: Discussed importance of regular tooth brushing, flossing, and dental visits.  -Health maintenance and immunizations reviewed. Please refer to Health maintenance section.  Return to care in 1 year for next preventative visit.     Subjective:  HPI:  She has no acute complaints today.   She still have muscle weakness. This is mostly in the shoulder and lower back. She had difficulty exercise this morning. No pain. She is taking Zanaflex which works well for her.  She has been working with massage therapist which seems to be helping.   She also had rash behind her ear. This has not been bothering her. She was diagnosed with psoriasis or dermatitis. She would like to see rheumatologist.   Lifestyle Diet: Balanced Exercise: None specific.   Depression screen PHQ 2/9 05/26/2020  Decreased Interest 0  Down, Depressed, Hopeless 0  PHQ - 2 Score 0    Health Maintenance Due  Topic Date Due   HIV Screening  Never done   Hepatitis C Screening  Never done   PAP-Cervical Cytology Screening  08/10/2019   PAP SMEAR-Modifier  08/10/2019   COVID-19 Vaccine (3 - Booster for Pfizer series) 09/09/2019     ROS: Per HPI, otherwise a complete review of systems was negative.   PMH:  The following were reviewed and entered/updated in epic: Past Medical History:  Diagnosis Date   PCOS (polycystic ovarian syndrome)    Patient Active Problem List   Diagnosis Date Noted   Psoriasis 03/11/2021   Anxiety 10/27/2020   Polyarthritis 05/26/2020   History reviewed. No pertinent surgical history.  Family History  Problem Relation Age of Onset  Cancer Other    Heart disease Other    Depression Mother    Cancer Mother    COPD Mother    Arthritis Mother    Bipolar disorder Mother    Cancer Paternal Grandmother     Medications- reviewed and  updated Current Outpatient Medications  Medication Sig Dispense Refill   DULoxetine (CYMBALTA) 30 MG capsule Take 1 capsule (30 mg total) by mouth daily. 90 capsule 3   ibuprofen (ADVIL) 400 MG tablet Take 400 mg by mouth every 6 (six) hours as needed.     tiZANidine (ZANAFLEX) 4 MG tablet TAKE ONE TABLET BY MOUTH EVERY 6 HOURS AS NEEDED FOR MUSCLE SPASMS 30 tablet 1   No current facility-administered medications for this visit.    Allergies-reviewed and updated No Known Allergies  Social History   Socioeconomic History   Marital status: Married    Spouse name: Not on file   Number of children: Not on file   Years of education: Not on file   Highest education level: Not on file  Occupational History   Not on file  Tobacco Use   Smoking status: Former    Types: Cigarettes    Passive exposure: Past   Smokeless tobacco: Never  Substance and Sexual Activity   Alcohol use: Not on file   Drug use: Not on file   Sexual activity: Not on file  Other Topics Concern   Not on file  Social History Narrative   ** Merged History Encounter **       Social Determinants of Health   Financial Resource Strain: Not on file  Food Insecurity: Not on file  Transportation Needs: Not on file  Physical Activity: Not on file  Stress: Not on file  Social Connections: Not on file        Objective:  Physical Exam: BP 110/70 (BP Location: Right Arm)    Pulse 91    Temp (!) 96.6 F (35.9 C) (Temporal)    Ht 5\' 2"  (1.575 m)    Wt 207 lb 6.4 oz (94.1 kg)    LMP 02/15/2021 (Approximate)    SpO2 98%    BMI 37.93 kg/m   Body mass index is 37.93 kg/m. Wt Readings from Last 3 Encounters:  03/11/21 207 lb 6.4 oz (94.1 kg)  10/27/20 204 lb 9.6 oz (92.8 kg)  05/26/20 198 lb (89.8 kg)   Gen: NAD, resting comfortably HEENT: TMs normal bilaterally. OP clear. No thyromegaly noted.  CV: RRR with no murmurs appreciated Pulm: NWOB, CTAB with no crackles, wheezes, or rhonchi GI: Normal bowel sounds  present. Soft, Nontender, Nondistended. MSK: no edema, cyanosis, or clubbing noted Skin: warm, dry Neuro: CN2-12 grossly intact. Strength 5/5 in upper and lower extremities. Reflexes symmetric and intact bilaterally.  Psych: Normal affect and thought content      I,Savera Zaman,acting as a scribe for 05/28/20, MD.,have documented all relevant documentation on the behalf of Jacquiline Doe, MD,as directed by  Jacquiline Doe, MD while in the presence of Jacquiline Doe, MD.   I, Jacquiline Doe, MD, have reviewed all documentation for this visit. The documentation on 03/11/21 for the exam, diagnosis, procedures, and orders are all accurate and complete.  03/13/21. Katina Degree, MD 03/11/2021 3:52 PM

## 2021-03-11 NOTE — Assessment & Plan Note (Signed)
Symptoms are improving with Cymbalta.  We will continue current dose.  Discussed referral back to physical therapy however she would like to decline for now.  We will continue Zanaflex as needed.  Can consider increasing dose of Cymbalta if needed.  There is possibility for psoriatic arthritis given her concurrent psoriasis and distribution of her arthritic pain.  We will check additional labs and refer her to see rheumatology.

## 2021-03-11 NOTE — Assessment & Plan Note (Signed)
Patient diagnosed with psoriasis and seborrheic dermatitis.  She has several lesions on her scalp both of these diagnoses.  Has a prominent psoriatic lesion posterior to left auricle consistent with psoriasis.  Her psoriasis in and of itself is not particularly bothersome at this point and she does not wish to have further treatment however does raise possibility for possible underlying psoriatic arthritis.  We will pursue further work-up as below.

## 2021-03-11 NOTE — Assessment & Plan Note (Signed)
Doing better with Cymbalta 30 mg daily.  She would like to continue current dose for now.

## 2021-03-11 NOTE — Patient Instructions (Signed)
It was very nice to see you today!  Please come back for labs.  Get your pap smear soon.  It is possible you could have psoriatic arthritis.  I will refer you to see a rheumatologist.  We will see you back in year for your next physical.  Come back sooner if needed.  Take care, Dr Jerline Pain  PLEASE NOTE:  If you had any lab tests please let us know if you have not heard back within a few days. You may see your results on mychart before we have a chance to review them but we will give you a call once they are reviewed by [place]. If we ordered any referrals today, please let us know if you have not heard from their office within the next week.   Please try these tips to maintain a healthy lifestyle:  Eat at least 3 REAL meals and 1-2 snacks per day.  Aim for no more than 5 hours between eating.  If you eat breakfast, please do so within one hour of getting up.   Each meal should contain half fruits/vegetables, one quarter protein, and one quarter carbs (no bigger than a computer mouse)  Cut down on sweet beverages. This includes juice, soda, and sweet tea.   Drink at least 1 glass of water with each meal and aim for at least 8 glasses per day  Exercise at least 150 minutes every week.    Preventive Care 47-68 Years Old, Female Preventive care refers to lifestyle choices and visits with your health care provider that can promote health and wellness. Preventive care visits are also called wellness exams. What can I expect for my preventive care visit? Counseling During your preventive care visit, your health care provider may ask about your: Medical history, including: Past medical problems. Family medical history. Pregnancy history. Current health, including: Menstrual cycle. Method of birth control. Emotional well-being. Home life and relationship well-being. Sexual activity and sexual health. Lifestyle, including: Alcohol, nicotine or tobacco, and drug use. Access to  firearms. Diet, exercise, and sleep habits. Work and work Statistician. Sunscreen use. Safety issues such as seatbelt and bike helmet use. Physical exam Your health care provider may check your: Height and weight. These may be used to calculate your BMI (body mass index). BMI is a measurement that tells if you are at a healthy weight. Waist circumference. This measures the distance around your waistline. This measurement also tells if you are at a healthy weight and may help predict your risk of certain diseases, such as type 2 diabetes and high blood pressure. Heart rate and blood pressure. Body temperature. Skin for abnormal spots. What immunizations do I need? Vaccines are usually given at various ages, according to a schedule. Your health care provider will recommend vaccines for you based on your age, medical history, and lifestyle or other factors, such as travel or where you work. What tests do I need? Screening Your health care provider may recommend screening tests for certain conditions. This may include: Pelvic exam and Pap test. Lipid and cholesterol levels. Diabetes screening. This is done by checking your blood sugar (glucose) after you have not eaten for a while (fasting). Hepatitis B test. Hepatitis C test. HIV (human immunodeficiency virus) test. STI (sexually transmitted infection) testing, if you are at risk. BRCA-related cancer screening. This may be done if you have a family history of breast, ovarian, tubal, or peritoneal cancers. Talk with your health care provider about your test results, treatment options, and  if necessary, the need for more tests. Follow these instructions at home: Eating and drinking  Eat a healthy diet that includes fresh fruits and vegetables, whole grains, lean protein, and low-fat dairy products. Take vitamin and mineral supplements as recommended by your health care provider. Do not drink alcohol if: Your health care provider tells you  not to drink. You are pregnant, may be pregnant, or are planning to become pregnant. If you drink alcohol: Limit how much you have to 0-1 drink a day. Know how much alcohol is in your drink. In the [place] one drink equals one 12 oz bottle of beer (355 mL), one 5 oz glass of wine (148 mL), or one 1 oz glass of hard liquor (44 mL). Lifestyle Brush your teeth every morning and night with fluoride toothpaste. Floss one time each day. Exercise for at least 30 minutes 5 or more days each week. Do not use any products that contain nicotine or tobacco. These products include cigarettes, chewing tobacco, and vaping devices, such as e-cigarettes. If you need help quitting, ask your health care provider. Do not use drugs. If you are sexually active, practice safe sex. Use a condom or other form of protection to prevent STIs. If you do not wish to become pregnant, use a form of birth control. If you plan to become pregnant, see your health care provider for a prepregnancy visit. Find healthy ways to manage stress, such as: Meditation, yoga, or listening to music. Journaling. Talking to a trusted person. Spending time with friends and family. Minimize exposure to UV radiation to reduce your risk of skin cancer. Safety Always wear your seat belt while driving or riding in a vehicle. Do not drive: If you have been drinking alcohol. Do not ride with someone who has been drinking. If you have been using any mind-altering substances or drugs. While texting. When you are tired or distracted. Wear a helmet and other protective equipment during sports activities. If you have firearms in your house, make sure you follow all gun safety procedures. Seek help if you have been physically or sexually abused. What's next? Go to your health care provider once a year for an annual wellness visit. Ask your health care provider how often you should have your eyes and teeth checked. Stay up to date on all  vaccines. This information is not intended to replace advice given to you by your health care provider. Make sure you discuss any questions you have with your health care provider. Document Revised: 08/26/2020 Document Reviewed: 08/26/2020 Elsevier Patient Education  [place].

## 2021-03-15 ENCOUNTER — Other Ambulatory Visit: Payer: Self-pay | Admitting: Family Medicine

## 2021-03-18 ENCOUNTER — Other Ambulatory Visit: Payer: 59

## 2021-03-19 ENCOUNTER — Other Ambulatory Visit (INDEPENDENT_AMBULATORY_CARE_PROVIDER_SITE_OTHER): Payer: 59

## 2021-03-19 ENCOUNTER — Other Ambulatory Visit: Payer: Self-pay

## 2021-03-19 DIAGNOSIS — R739 Hyperglycemia, unspecified: Secondary | ICD-10-CM | POA: Diagnosis not present

## 2021-03-19 DIAGNOSIS — Z13220 Encounter for screening for lipoid disorders: Secondary | ICD-10-CM | POA: Diagnosis not present

## 2021-03-19 DIAGNOSIS — M130 Polyarthritis, unspecified: Secondary | ICD-10-CM

## 2021-03-19 DIAGNOSIS — Z0001 Encounter for general adult medical examination with abnormal findings: Secondary | ICD-10-CM | POA: Diagnosis not present

## 2021-03-19 DIAGNOSIS — F419 Anxiety disorder, unspecified: Secondary | ICD-10-CM | POA: Diagnosis not present

## 2021-03-19 LAB — COMPREHENSIVE METABOLIC PANEL
ALT: 18 U/L (ref 0–35)
AST: 20 U/L (ref 0–37)
Albumin: 4.5 g/dL (ref 3.5–5.2)
Alkaline Phosphatase: 86 U/L (ref 39–117)
BUN: 12 mg/dL (ref 6–23)
CO2: 29 mEq/L (ref 19–32)
Calcium: 9.5 mg/dL (ref 8.4–10.5)
Chloride: 101 mEq/L (ref 96–112)
Creatinine, Ser: 0.63 mg/dL (ref 0.40–1.20)
GFR: 120.15 mL/min (ref >60.00)
Glucose, Bld: 90 mg/dL (ref 70–99)
Potassium: 4.7 mEq/L (ref 3.5–5.1)
Sodium: 136 mEq/L (ref 135–145)
Total Bilirubin: 0.6 mg/dL (ref 0.2–1.2)
Total Protein: 7 g/dL (ref 6.0–8.3)

## 2021-03-19 LAB — LIPID PANEL
Cholesterol: 173 mg/dL (ref 0–200)
HDL: 56.8 mg/dL
LDL Cholesterol: 104 mg/dL — ABNORMAL HIGH (ref 0–99)
NonHDL: 115.76
Total CHOL/HDL Ratio: 3
Triglycerides: 61 mg/dL (ref 0.0–149.0)
VLDL: 12.2 mg/dL (ref 0.0–40.0)

## 2021-03-19 LAB — CBC
HCT: 41.4 % (ref 36.0–46.0)
Hemoglobin: 14 g/dL (ref 12.0–15.0)
MCHC: 34 g/dL (ref 30.0–36.0)
MCV: 89.2 fl (ref 78.0–100.0)
Platelets: 279 10*3/uL (ref 150.0–400.0)
RBC: 4.64 Mil/uL (ref 3.87–5.11)
RDW: 13.5 % (ref 11.5–15.5)
WBC: 5.5 10*3/uL (ref 4.0–10.5)

## 2021-03-19 LAB — TSH: TSH: 1.28 u[IU]/mL (ref 0.35–5.50)

## 2021-03-19 LAB — VITAMIN D 25 HYDROXY (VIT D DEFICIENCY, FRACTURES): VITD: 32.43 ng/mL (ref 30.00–100.00)

## 2021-03-19 LAB — VITAMIN B12: Vitamin B-12: 367 pg/mL (ref 211–911)

## 2021-03-19 LAB — HEMOGLOBIN A1C: Hgb A1c MFr Bld: 5.2 % (ref 4.6–6.5)

## 2021-03-22 ENCOUNTER — Encounter: Payer: Self-pay | Admitting: Family Medicine

## 2021-03-22 DIAGNOSIS — E785 Hyperlipidemia, unspecified: Secondary | ICD-10-CM | POA: Insufficient documentation

## 2021-03-22 LAB — HLA-B27 ANTIGEN: HLA-B27 Antigen: NEGATIVE

## 2021-03-22 NOTE — Progress Notes (Signed)
Please inform patient of the following:  Labs are all stable.  Cholesterol is borderline elevated but stable.  Do not need to make any medication changes.  She should continue working on diet and exercise and we can recheck in a year or so.

## 2021-06-22 ENCOUNTER — Other Ambulatory Visit: Payer: Self-pay | Admitting: Family Medicine

## 2021-09-30 ENCOUNTER — Other Ambulatory Visit: Payer: Self-pay | Admitting: Family Medicine

## 2021-10-03 ENCOUNTER — Other Ambulatory Visit: Payer: Self-pay | Admitting: Family Medicine

## 2021-10-20 ENCOUNTER — Encounter (INDEPENDENT_AMBULATORY_CARE_PROVIDER_SITE_OTHER): Payer: Self-pay

## 2021-10-28 LAB — CBC: RBC: 4.84 (ref 3.87–5.11)

## 2021-10-28 LAB — HEPATIC FUNCTION PANEL
ALT: 15 U/L (ref 7–35)
AST: 20 (ref 13–35)
Alkaline Phosphatase: 76 (ref 25–125)
Bilirubin, Total: 0.2

## 2021-10-28 LAB — COMPREHENSIVE METABOLIC PANEL
Albumin: 4.6 (ref 3.5–5.0)
Calcium: 9.6 (ref 8.7–10.7)
Globulin: 2.6
eGFR: 121

## 2021-10-28 LAB — CBC AND DIFFERENTIAL
HCT: 44 (ref 36–46)
Hemoglobin: 14.7 (ref 12.0–16.0)
Neutrophils Absolute: 5.5
Platelets: 299 10*3/uL (ref 150–400)
WBC: 7.8

## 2021-10-28 LAB — BASIC METABOLIC PANEL
BUN: 17 (ref 4–21)
CO2: 25 — AB (ref 13–22)
Chloride: 102 (ref 99–108)
Creatinine: 0.7 (ref 0.5–1.1)
Glucose: 89
Sodium: 140 (ref 137–147)

## 2021-10-28 LAB — HEPATITIS B SURFACE ANTIGEN: Hepatitis B Surface Ag: NEGATIVE

## 2021-10-28 LAB — HM HEPATITIS C SCREENING LAB: HM Hepatitis Screen: NEGATIVE

## 2021-11-26 LAB — HM PAP SMEAR: HM Pap smear: NEGATIVE

## 2021-12-06 ENCOUNTER — Encounter: Payer: Self-pay | Admitting: *Deleted

## 2021-12-10 ENCOUNTER — Encounter: Payer: Self-pay | Admitting: Family Medicine

## 2021-12-17 ENCOUNTER — Other Ambulatory Visit: Payer: Self-pay | Admitting: Family Medicine

## 2022-02-01 ENCOUNTER — Ambulatory Visit (INDEPENDENT_AMBULATORY_CARE_PROVIDER_SITE_OTHER): Payer: 59 | Admitting: Family Medicine

## 2022-02-01 VITALS — BP 93/62 | HR 67 | Temp 97.5°F | Ht 62.0 in | Wt 201.4 lb

## 2022-02-01 DIAGNOSIS — L409 Psoriasis, unspecified: Secondary | ICD-10-CM | POA: Diagnosis not present

## 2022-02-01 DIAGNOSIS — L4050 Arthropathic psoriasis, unspecified: Secondary | ICD-10-CM | POA: Diagnosis not present

## 2022-02-01 DIAGNOSIS — E785 Hyperlipidemia, unspecified: Secondary | ICD-10-CM

## 2022-02-01 DIAGNOSIS — R739 Hyperglycemia, unspecified: Secondary | ICD-10-CM

## 2022-02-01 DIAGNOSIS — Z114 Encounter for screening for human immunodeficiency virus [HIV]: Secondary | ICD-10-CM

## 2022-02-01 DIAGNOSIS — F419 Anxiety disorder, unspecified: Secondary | ICD-10-CM | POA: Diagnosis not present

## 2022-02-01 DIAGNOSIS — Z0001 Encounter for general adult medical examination with abnormal findings: Secondary | ICD-10-CM | POA: Diagnosis not present

## 2022-02-01 NOTE — Assessment & Plan Note (Signed)
Recently diagnosed with psoriatic arthritis by rheumatology.  She is not currently on any medication specifically for this however they are trying to get her on a biologic agent.  May be starting Cimzia versus Humira.

## 2022-02-01 NOTE — Progress Notes (Signed)
Chief Complaint:  Christina Hull is a 31 y.o. female who presents today for her annual comprehensive physical exam.    Assessment/Plan:  New/Acute Problems: Concern for decreased hearing Normal hearing test in office today and no abnormalities on exam.  Chronic Problems Addressed Today: Psoriatic arthritis (HCC) Recently diagnosed with psoriatic arthritis by rheumatology.  She is not currently on any medication specifically for this however they are trying to get her on a biologic agent.  May be starting Cimzia versus Humira.  Anxiety Symptoms are well controlled on Cymbalta 30 mg daily.  No significant side effects.  Psoriasis Symptoms are currently manageable.  She is asked improvement when she starts biologic agents as above.  Dyslipidemia Discussed lifestyle modifications.  We will check labs today.  Preventative Healthcare: She will get flu and COVID-vaccine at the pharmacy.  Check labs today.  Recently had Pap done with gynecology.  Patient Counseling(The following topics were reviewed and/or handout was given):  -Nutrition: Stressed importance of moderation in sodium/caffeine intake, saturated fat and cholesterol, caloric balance, sufficient intake of fresh fruits, vegetables, and fiber.  -Stressed the importance of regular exercise.   -Substance Abuse: Discussed cessation/primary prevention of tobacco, alcohol, or other drug use; driving or other dangerous activities under the influence; availability of treatment for abuse.   -Injury prevention: Discussed safety belts, safety helmets, smoke detector, smoking near bedding or upholstery.   -Sexuality: Discussed sexually transmitted diseases, partner selection, use of condoms, avoidance of unintended pregnancy and contraceptive alternatives.   -Dental health: Discussed importance of regular tooth brushing, flossing, and dental visits.  -Health maintenance and immunizations reviewed. Please refer to Health maintenance  section.  Return to care in 1 year for next preventative visit.     Subjective:  HPI:  She has no acute complaints today.   Lifestyle Diet: Balanced. Needs to get Exercise: Works on physical therapy.      02/01/2022    8:16 AM  Depression screen PHQ 2/9  Decreased Interest 0  Down, Depressed, Hopeless 0  PHQ - 2 Score 0    Health Maintenance Due  Topic Date Due   PAP-Cervical Cytology Screening  08/10/2019   PAP SMEAR-Modifier  08/10/2019     ROS: Per HPI, otherwise a complete review of systems was negative.   PMH:  The following were reviewed and entered/updated in epic: Past Medical History:  Diagnosis Date   PCOS (polycystic ovarian syndrome)    Patient Active Problem List   Diagnosis Date Noted   Dyslipidemia 03/22/2021   Psoriasis 03/11/2021   Anxiety 10/27/2020   Psoriatic arthritis (HCC) 05/26/2020   No past surgical history on file.  Family History  Problem Relation Age of Onset   Cancer Other    Heart disease Other    Depression Mother    Cancer Mother    COPD Mother    Arthritis Mother    Bipolar disorder Mother    Cancer Paternal Grandmother     Medications- reviewed and updated Current Outpatient Medications  Medication Sig Dispense Refill   DULoxetine (CYMBALTA) 30 MG capsule TAKE 1 CAPSULE BY MOUTH DAILY 30 capsule 1   ibuprofen (ADVIL) 400 MG tablet Take 400 mg by mouth every 6 (six) hours as needed.     tiZANidine (ZANAFLEX) 4 MG tablet TAKE ONE TABLET BY MOUTH EVERY 6 HOURS AS NEEDED FOR MUSCLE SPASMS 30 tablet 1   No current facility-administered medications for this visit.    Allergies-reviewed and updated No Known Allergies  Social  History   Socioeconomic History   Marital status: Married    Spouse name: Not on file   Number of children: Not on file   Years of education: Not on file   Highest education level: Not on file  Occupational History   Not on file  Tobacco Use   Smoking status: Former    Types: Cigarettes     Passive exposure: Past   Smokeless tobacco: Never  Substance and Sexual Activity   Alcohol use: Not on file   Drug use: Not on file   Sexual activity: Not on file  Other Topics Concern   Not on file  Social History Narrative   ** Merged History Encounter **       Social Determinants of Health   Financial Resource Strain: Not on file  Food Insecurity: Not on file  Transportation Needs: Not on file  Physical Activity: Not on file  Stress: Not on file  Social Connections: Not on file        Objective:  Physical Exam: BP 93/62   Pulse 67   Temp (!) 97.5 F (36.4 C) (Temporal)   Ht 5\' 2"  (1.575 m)   Wt 201 lb 6.4 oz (91.4 kg)   SpO2 99%   BMI 36.84 kg/m   Body mass index is 36.84 kg/m. Wt Readings from Last 3 Encounters:  02/01/22 201 lb 6.4 oz (91.4 kg)  03/11/21 207 lb 6.4 oz (94.1 kg)  10/27/20 204 lb 9.6 oz (92.8 kg)  Gen: NAD, resting comfortably HEENT: TMs normal bilaterally. OP clear. No thyromegaly noted.  CV: RRR with no murmurs appreciated Pulm: NWOB, CTAB with no crackles, wheezes, or rhonchi GI: Normal bowel sounds present. Soft, Nontender, Nondistended. MSK: no edema, cyanosis, or clubbing noted Skin: warm, dry Neuro: CN2-12 grossly intact. Strength 5/5 in upper and lower extremities. Reflexes symmetric and intact bilaterally.  Psych: Normal affect and thought content     Vashon Riordan M. 10/29/20, MD 02/01/2022 9:05 AM

## 2022-02-01 NOTE — Assessment & Plan Note (Signed)
Symptoms are currently manageable.  She is asked improvement when she starts biologic agents as above.

## 2022-02-01 NOTE — Patient Instructions (Signed)
It was very nice to see you today!  I am glad that you are doing better.  We will continue current medications.  Please continue to work on diet and exercise.  We will check blood work.  Please come back to see me in 1 year for your next physical.  Come back to see me sooner if needed.  Take care, Dr Jerline Pain  PLEASE NOTE:  If you had any lab tests please let us know if you have not heard back within a few days. You may see your results on mychart before we have a chance to review them but we will give you a call once they are reviewed by [place]. If we ordered any referrals today, please let us know if you have not heard from their office within the next week.   Please try these tips to maintain a healthy lifestyle:  Eat at least 3 REAL meals and 1-2 snacks per day.  Aim for no more than 5 hours between eating.  If you eat breakfast, please do so within one hour of getting up.   Each meal should contain half fruits/vegetables, one quarter protein, and one quarter carbs (no bigger than a computer mouse)  Cut down on sweet beverages. This includes juice, soda, and sweet tea.   Drink at least 1 glass of water with each meal and aim for at least 8 glasses per day  Exercise at least 150 minutes every week.    Preventive Care 15-81 Years Old, Female Preventive care refers to lifestyle choices and visits with your health care provider that can promote health and wellness. Preventive care visits are also called wellness exams. What can I expect for my preventive care visit? Counseling During your preventive care visit, your health care provider may ask about your: Medical history, including: Past medical problems. Family medical history. Pregnancy history. Current health, including: Menstrual cycle. Method of birth control. Emotional well-being. Home life and relationship well-being. Sexual activity and sexual health. Lifestyle, including: Alcohol, nicotine or tobacco, and drug  use. Access to firearms. Diet, exercise, and sleep habits. Work and work Statistician. Sunscreen use. Safety issues such as seatbelt and bike helmet use. Physical exam Your health care provider may check your: Height and weight. These may be used to calculate your BMI (body mass index). BMI is a measurement that tells if you are at a healthy weight. Waist circumference. This measures the distance around your waistline. This measurement also tells if you are at a healthy weight and may help predict your risk of certain diseases, such as type 2 diabetes and high blood pressure. Heart rate and blood pressure. Body temperature. Skin for abnormal spots. What immunizations do I need?  Vaccines are usually given at various ages, according to a schedule. Your health care provider will recommend vaccines for you based on your age, medical history, and lifestyle or other factors, such as travel or where you work. What tests do I need? Screening Your health care provider may recommend screening tests for certain conditions. This may include: Pelvic exam and Pap test. Lipid and cholesterol levels. Diabetes screening. This is done by checking your blood sugar (glucose) after you have not eaten for a while (fasting). Hepatitis B test. Hepatitis C test. HIV (human immunodeficiency virus) test. STI (sexually transmitted infection) testing, if you are at risk. BRCA-related cancer screening. This may be done if you have a family history of breast, ovarian, tubal, or peritoneal cancers. Talk with your health care provider about  your test results, treatment options, and if necessary, the need for more tests. Follow these instructions at home: Eating and drinking  Eat a healthy diet that includes fresh fruits and vegetables, whole grains, lean protein, and low-fat dairy products. Take vitamin and mineral supplements as recommended by your health care provider. Do not drink alcohol if: Your health care  provider tells you not to drink. You are pregnant, may be pregnant, or are planning to become pregnant. If you drink alcohol: Limit how much you have to 0-1 drink a day. Know how much alcohol is in your drink. In the [place] one drink equals one 12 oz bottle of beer (355 mL), one 5 oz glass of wine (148 mL), or one 1 oz glass of hard liquor (44 mL). Lifestyle Brush your teeth every morning and night with fluoride toothpaste. Floss one time each day. Exercise for at least 30 minutes 5 or more days each week. Do not use any products that contain nicotine or tobacco. These products include cigarettes, chewing tobacco, and vaping devices, such as e-cigarettes. If you need help quitting, ask your health care provider. Do not use drugs. If you are sexually active, practice safe sex. Use a condom or other form of protection to prevent STIs. If you do not wish to become pregnant, use a form of birth control. If you plan to become pregnant, see your health care provider for a prepregnancy visit. Find healthy ways to manage stress, such as: Meditation, yoga, or listening to music. Journaling. Talking to a trusted person. Spending time with friends and family. Minimize exposure to UV radiation to reduce your risk of skin cancer. Safety Always wear your seat belt while driving or riding in a vehicle. Do not drive: If you have been drinking alcohol. Do not ride with someone who has been drinking. If you have been using any mind-altering substances or drugs. While texting. When you are tired or distracted. Wear a helmet and other protective equipment during sports activities. If you have firearms in your house, make sure you follow all gun safety procedures. Seek help if you have been physically or sexually abused. What's next? Go to your health care provider once a year for an annual wellness visit. Ask your health care provider how often you should have your eyes and teeth checked. Stay up to date  on all vaccines. This information is not intended to replace advice given to you by your health care provider. Make sure you discuss any questions you have with your health care provider. Document Revised: 08/26/2020 Document Reviewed: 08/26/2020 Elsevier Patient Education  [place].

## 2022-02-01 NOTE — Assessment & Plan Note (Signed)
Symptoms are well controlled on Cymbalta 30 mg daily.  No significant side effects.

## 2022-02-01 NOTE — Assessment & Plan Note (Signed)
Discussed lifestyle modifications.  We will check labs today.

## 2022-02-07 ENCOUNTER — Other Ambulatory Visit: Payer: 59

## 2022-02-08 ENCOUNTER — Other Ambulatory Visit (INDEPENDENT_AMBULATORY_CARE_PROVIDER_SITE_OTHER): Payer: 59

## 2022-02-08 DIAGNOSIS — Z114 Encounter for screening for human immunodeficiency virus [HIV]: Secondary | ICD-10-CM

## 2022-02-08 DIAGNOSIS — Z0001 Encounter for general adult medical examination with abnormal findings: Secondary | ICD-10-CM | POA: Diagnosis not present

## 2022-02-08 DIAGNOSIS — L4050 Arthropathic psoriasis, unspecified: Secondary | ICD-10-CM

## 2022-02-08 DIAGNOSIS — R739 Hyperglycemia, unspecified: Secondary | ICD-10-CM | POA: Diagnosis not present

## 2022-02-08 DIAGNOSIS — E785 Hyperlipidemia, unspecified: Secondary | ICD-10-CM

## 2022-02-08 LAB — CBC
HCT: 42.5 % (ref 36.0–46.0)
Hemoglobin: 14.8 g/dL (ref 12.0–15.0)
MCHC: 35 g/dL (ref 30.0–36.0)
MCV: 90 fl (ref 78.0–100.0)
Platelets: 280 10*3/uL (ref 150.0–400.0)
RBC: 4.72 Mil/uL (ref 3.87–5.11)
RDW: 12.9 % (ref 11.5–15.5)
WBC: 5.3 10*3/uL (ref 4.0–10.5)

## 2022-02-08 LAB — HEMOGLOBIN A1C: Hgb A1c MFr Bld: 5.2 % (ref 4.6–6.5)

## 2022-02-08 LAB — LIPID PANEL
Cholesterol: 165 mg/dL (ref 0–200)
HDL: 54.6 mg/dL (ref >39.00)
LDL Cholesterol: 87 mg/dL (ref 0–99)
NonHDL: 110.2
Total CHOL/HDL Ratio: 3
Triglycerides: 115 mg/dL (ref 0.0–149.0)
VLDL: 23 mg/dL (ref 0.0–40.0)

## 2022-02-08 LAB — COMPREHENSIVE METABOLIC PANEL
ALT: 16 U/L (ref 0–35)
AST: 16 U/L (ref 0–37)
Albumin: 4.4 g/dL (ref 3.5–5.2)
Alkaline Phosphatase: 66 U/L (ref 39–117)
BUN: 15 mg/dL (ref 6–23)
CO2: 28 mEq/L (ref 19–32)
Calcium: 9.2 mg/dL (ref 8.4–10.5)
Chloride: 104 mEq/L (ref 96–112)
Creatinine, Ser: 0.58 mg/dL (ref 0.40–1.20)
GFR: 121.81 mL/min (ref >60.00)
Glucose, Bld: 97 mg/dL (ref 70–99)
Potassium: 5.2 mEq/L — ABNORMAL HIGH (ref 3.5–5.1)
Sodium: 140 mEq/L (ref 135–145)
Total Bilirubin: 0.3 mg/dL (ref 0.2–1.2)
Total Protein: 6.8 g/dL (ref 6.0–8.3)

## 2022-02-08 LAB — TSH: TSH: 1.27 u[IU]/mL (ref 0.35–5.50)

## 2022-02-09 LAB — HIV ANTIBODY (ROUTINE TESTING W REFLEX): HIV 1&2 Ab, 4th Generation: NONREACTIVE

## 2022-02-10 NOTE — Progress Notes (Signed)
Please inform patient of the following:  Labs are all stable.  Do not need to make any changes to her treatment plan at this time.  She should keep up the good work with her diet and exercise and we can recheck again in a few years.

## 2022-02-22 ENCOUNTER — Other Ambulatory Visit: Payer: Self-pay | Admitting: Family Medicine

## 2022-03-09 ENCOUNTER — Telehealth: Payer: Self-pay | Admitting: Family Medicine

## 2022-03-09 NOTE — Telephone Encounter (Signed)
Pt returned a call to our office regarding lab results. Would like a call back ?

## 2022-03-10 NOTE — Telephone Encounter (Signed)
See lab results, pt has been advised

## 2022-04-12 ENCOUNTER — Encounter (HOSPITAL_COMMUNITY): Payer: Self-pay | Admitting: Registered Nurse

## 2022-04-12 ENCOUNTER — Inpatient Hospital Stay (HOSPITAL_COMMUNITY)
Admission: AD | Admit: 2022-04-12 | Discharge: 2022-04-17 | DRG: 885 | Disposition: A | Discharge status: Home / Self Care | Payer: 59 | Source: Intra-hospital | Attending: Psychiatry | Admitting: Psychiatry

## 2022-04-12 ENCOUNTER — Ambulatory Visit (HOSPITAL_COMMUNITY)
Admission: EM | Admit: 2022-04-12 | Discharge: 2022-04-12 | Disposition: A | Discharge status: Other Acute Inpatient Hospital | Payer: 59 | Attending: Registered Nurse | Admitting: Registered Nurse

## 2022-04-12 DIAGNOSIS — Z818 Family history of other mental and behavioral disorders: Secondary | ICD-10-CM

## 2022-04-12 DIAGNOSIS — F332 Major depressive disorder, recurrent severe without psychotic features: Secondary | ICD-10-CM | POA: Diagnosis not present

## 2022-04-12 DIAGNOSIS — F603 Borderline personality disorder: Secondary | ICD-10-CM | POA: Diagnosis present

## 2022-04-12 DIAGNOSIS — G4700 Insomnia, unspecified: Secondary | ICD-10-CM | POA: Diagnosis present

## 2022-04-12 DIAGNOSIS — R45851 Suicidal ideations: Secondary | ICD-10-CM | POA: Diagnosis present

## 2022-04-12 DIAGNOSIS — Z1152 Encounter for screening for COVID-19: Secondary | ICD-10-CM | POA: Diagnosis not present

## 2022-04-12 DIAGNOSIS — Z87891 Personal history of nicotine dependence: Secondary | ICD-10-CM | POA: Diagnosis not present

## 2022-04-12 DIAGNOSIS — E282 Polycystic ovarian syndrome: Secondary | ICD-10-CM | POA: Diagnosis present

## 2022-04-12 DIAGNOSIS — Z803 Family history of malignant neoplasm of breast: Secondary | ICD-10-CM | POA: Diagnosis not present

## 2022-04-12 DIAGNOSIS — Z825 Family history of asthma and other chronic lower respiratory diseases: Secondary | ICD-10-CM

## 2022-04-12 DIAGNOSIS — Z8261 Family history of arthritis: Secondary | ICD-10-CM

## 2022-04-12 DIAGNOSIS — Z79899 Other long term (current) drug therapy: Secondary | ICD-10-CM | POA: Diagnosis not present

## 2022-04-12 DIAGNOSIS — L4050 Arthropathic psoriasis, unspecified: Secondary | ICD-10-CM | POA: Diagnosis present

## 2022-04-12 DIAGNOSIS — F411 Generalized anxiety disorder: Secondary | ICD-10-CM | POA: Diagnosis present

## 2022-04-12 LAB — URINALYSIS, ROUTINE W REFLEX MICROSCOPIC
Bacteria, UA: NONE SEEN
Bilirubin Urine: NEGATIVE
Glucose, UA: NEGATIVE mg/dL
Ketones, ur: NEGATIVE mg/dL
Leukocytes,Ua: NEGATIVE
Nitrite: NEGATIVE
Protein, ur: NEGATIVE mg/dL
Specific Gravity, Urine: 1.012 (ref 1.005–1.030)
pH: 7 (ref 5.0–8.0)

## 2022-04-12 LAB — POCT URINE DRUG SCREEN - MANUAL ENTRY (I-SCREEN)
POC Amphetamine UR: NOT DETECTED
POC Buprenorphine (BUP): NOT DETECTED
POC Cocaine UR: NOT DETECTED
POC Marijuana UR: NOT DETECTED
POC Methadone UR: NOT DETECTED
POC Methamphetamine UR: NOT DETECTED
POC Morphine: NOT DETECTED
POC Oxazepam (BZO): NOT DETECTED
POC Oxycodone UR: NOT DETECTED
POC Secobarbital (BAR): NOT DETECTED

## 2022-04-12 LAB — COMPREHENSIVE METABOLIC PANEL
ALT: 16 U/L (ref 0–44)
AST: 21 U/L (ref 15–41)
Albumin: 4.5 g/dL (ref 3.5–5.0)
Alkaline Phosphatase: 73 U/L (ref 38–126)
Anion gap: 11 (ref 5–15)
BUN: 15 mg/dL (ref 6–20)
CO2: 27 mmol/L (ref 22–32)
Calcium: 9.6 mg/dL (ref 8.9–10.3)
Chloride: 100 mmol/L (ref 98–111)
Creatinine, Ser: 1.13 mg/dL — ABNORMAL HIGH (ref 0.44–1.00)
GFR, Estimated: >60 mL/min (ref >60)
Glucose, Bld: 121 mg/dL — ABNORMAL HIGH (ref 70–99)
Potassium: 4.8 mmol/L (ref 3.5–5.1)
Sodium: 138 mmol/L (ref 135–145)
Total Bilirubin: 0.4 mg/dL (ref 0.3–1.2)
Total Protein: 7.5 g/dL (ref 6.5–8.1)

## 2022-04-12 LAB — CBC WITH DIFFERENTIAL/PLATELET
Abs Immature Granulocytes: 0.02 10*3/uL (ref 0.00–0.07)
Basophils Absolute: 0.1 10*3/uL (ref 0.0–0.1)
Basophils Relative: 1 %
Eosinophils Absolute: 0 10*3/uL (ref 0.0–0.5)
Eosinophils Relative: 0 %
HCT: 42.5 % (ref 36.0–46.0)
Hemoglobin: 15.2 g/dL — ABNORMAL HIGH (ref 12.0–15.0)
Immature Granulocytes: 0 %
Lymphocytes Relative: 19 %
Lymphs Abs: 1.5 10*3/uL (ref 0.7–4.0)
MCH: 31.5 pg (ref 26.0–34.0)
MCHC: 35.8 g/dL (ref 30.0–36.0)
MCV: 88.2 fL (ref 80.0–100.0)
Monocytes Absolute: 0.3 10*3/uL (ref 0.1–1.0)
Monocytes Relative: 4 %
Neutro Abs: 5.9 10*3/uL (ref 1.7–7.7)
Neutrophils Relative %: 76 %
Platelets: 328 10*3/uL (ref 150–400)
RBC: 4.82 MIL/uL (ref 3.87–5.11)
RDW: 12 % (ref 11.5–15.5)
WBC: 7.8 10*3/uL (ref 4.0–10.5)
nRBC: 0 % (ref 0.0–0.2)

## 2022-04-12 LAB — MAGNESIUM: Magnesium: 2 mg/dL (ref 1.7–2.4)

## 2022-04-12 LAB — POCT PREGNANCY, URINE: Preg Test, Ur: NEGATIVE

## 2022-04-12 LAB — HEMOGLOBIN A1C
Hgb A1c MFr Bld: 4.8 % (ref 4.8–5.6)
Mean Plasma Glucose: 91.06 mg/dL

## 2022-04-12 LAB — TSH: TSH: 1.421 u[IU]/mL (ref 0.350–4.500)

## 2022-04-12 LAB — RESP PANEL BY RT-PCR (RSV, FLU A&B, COVID)  RVPGX2
Influenza A by PCR: NEGATIVE
Influenza B by PCR: NEGATIVE
Resp Syncytial Virus by PCR: NEGATIVE
SARS Coronavirus 2 by RT PCR: NEGATIVE

## 2022-04-12 LAB — LIPID PANEL
Cholesterol: 198 mg/dL (ref 0–200)
HDL: 70 mg/dL (ref >40)
LDL Cholesterol: 108 mg/dL — ABNORMAL HIGH (ref 0–99)
Total CHOL/HDL Ratio: 2.8 RATIO
Triglycerides: 98 mg/dL (ref <150)
VLDL: 20 mg/dL (ref 0–40)

## 2022-04-12 LAB — POC SARS CORONAVIRUS 2 AG: SARSCOV2ONAVIRUS 2 AG: NEGATIVE

## 2022-04-12 LAB — ETHANOL: Alcohol, Ethyl (B): <10 mg/dL (ref <10)

## 2022-04-12 MED ORDER — ALUM & MAG HYDROXIDE-SIMETH 200-200-20 MG/5ML PO SUSP: 30.0000 mL | ORAL | Status: DC | PRN

## 2022-04-12 MED ORDER — MAGNESIUM HYDROXIDE 400 MG/5ML PO SUSP: 30.0000 mL | Freq: Every day | ORAL | Status: DC | PRN

## 2022-04-12 MED ORDER — TIZANIDINE HCL 2 MG PO TABS
4.0000 mg | ORAL_TABLET | Freq: Four times a day (QID) | ORAL | Status: DC | PRN
  Administered 2022-04-13 – 2022-04-16 (×4): 4 mg via ORAL
  Filled 2022-04-12 (×4): qty 2

## 2022-04-12 MED ORDER — HYDROXYZINE HCL 25 MG PO TABS
25.0000 mg | ORAL_TABLET | Freq: Three times a day (TID) | ORAL | Status: DC | PRN
  Filled 2022-04-12: qty 1

## 2022-04-12 MED ORDER — IBUPROFEN 600 MG PO TABS
600.0000 mg | ORAL_TABLET | ORAL | Status: AC
  Administered 2022-04-12: 600 mg via ORAL
  Filled 2022-04-12 (×2): qty 1

## 2022-04-12 MED ORDER — DULOXETINE HCL 30 MG PO CPEP
30.0000 mg | ORAL_CAPSULE | Freq: Every day | ORAL | Status: DC
  Administered 2022-04-12: 30 mg via ORAL
  Filled 2022-04-12: qty 1

## 2022-04-12 MED ORDER — DULOXETINE HCL 30 MG PO CPEP
30.0000 mg | ORAL_CAPSULE | Freq: Every day | ORAL | Status: DC
  Administered 2022-04-13: 30 mg via ORAL
  Filled 2022-04-12 (×3): qty 1

## 2022-04-12 MED ORDER — TRAZODONE HCL 50 MG PO TABS: 50.0000 mg | ORAL_TABLET | Freq: Every evening | ORAL | Status: DC | PRN

## 2022-04-12 MED ORDER — DEXTROMETHORPHAN-BUPROPION ER 45-105 MG PO TBCR: 1.0000 | EXTENDED_RELEASE_TABLET | Freq: Two times a day (BID) | ORAL | Status: DC

## 2022-04-12 MED ORDER — TRAZODONE HCL 50 MG PO TABS
50.0000 mg | ORAL_TABLET | Freq: Every evening | ORAL | Status: DC | PRN
  Filled 2022-04-12: qty 1

## 2022-04-12 MED ORDER — ACETAMINOPHEN 325 MG PO TABS: 650.0000 mg | ORAL_TABLET | Freq: Four times a day (QID) | ORAL | Status: DC | PRN

## 2022-04-12 MED ORDER — HYDROXYZINE HCL 25 MG PO TABS: 25.0000 mg | ORAL_TABLET | Freq: Three times a day (TID) | ORAL | Status: DC | PRN

## 2022-04-12 MED ORDER — ACETAMINOPHEN 325 MG PO TABS
650.0000 mg | ORAL_TABLET | Freq: Four times a day (QID) | ORAL | Status: DC | PRN
  Administered 2022-04-14: 650 mg via ORAL
  Filled 2022-04-12: qty 2

## 2022-04-12 NOTE — Progress Notes (Signed)
Pt was accepted to [facility] 04/12/2022, pending labs, UDS, negative covid, and voluntary consent faxed to [phone]. Bed assignment: 305-1  Pt meets inpatient criteria per Earleen Newport, NP  Attending Physician will be Janine Limbo, MD  Report can be called to: Adult unit: [phone]  Pt can arrive after pending items are received; [facility] AC to coorindate arrival time  Care Team Notified: Earleen Newport, NP, [facility] Lynnda Shields, RN, and [address], [place], [place]  04/12/2022 3:07 PM

## 2022-04-12 NOTE — Progress Notes (Incomplete)
Dep 9 , anxiety 7

## 2022-04-12 NOTE — ED Notes (Signed)
Patient admitted to [facility]. Patient stated she is here due to suicidal ideations with plan to overdose on medications. Patient contracts for safety at this time. Patient has pending bed at [facility]. Report has been called. Patient will be transport over via safe transport.

## 2022-04-12 NOTE — ED Notes (Signed)
Safe transport called to transport patient to [facility]. Labs have resulted.

## 2022-04-12 NOTE — ED Notes (Signed)
Gave patient a Hot meal for dinner.

## 2022-04-12 NOTE — Progress Notes (Signed)
   04/12/22 1447  Christina Hull (Walk-ins at [facility] only)  How Did You Hear About [place]? Family/Friend (Husband, Christina Hull)  What Is the Reason for Your Visit/Call Today? Pt is a 32 yo female who presented voluntarily and accomapnaied by her husband, Christina Hull, due to a suicide attempt today. Pt gave verbal premission for husband to be present and participate in the assessment. Today pt attempted to cut her stomach open with a kitchen knife and when that did not go as planned, she attempted to intentionally overdose on presciption medications. During this process, pt calle dher husbadn who pusuaded her to stop. Husband brought her in for assessment. Pt denied HI, NSSH, AVH, paranoi and all substance use except for regular alcohol use. Pt stated that she drinks alcohol (beer, wine and/or liquor) about 2 to 3 times a week and does so at times until she passes out from consumption. Pt's last consumption of alcohol was about 4 days ago. Pt stated she has just begun services with [facility] for medication management and OP therapy. Her last visit and the start of medications was about 1 week ago.  How Long Has This Been Causing You Problems? > than 6 months  Have You Recently Had Any Thoughts About Hurting Yourself? Yes  How long ago did you have thoughts about hurting yourself? today  Are You Planning to [place] At This time? Yes  Have you Recently Had Thoughts About Hurting Someone Guadalupe Dawn? No  Are You Planning To Harm Someone At This Time? No  Are you currently experiencing any auditory, visual or other hallucinations? No  Have You Used Any Alcohol or Drugs in the Past 24 Hours? No  Do you have any current medical co-morbidities that require immediate attention? No  Clinician description of patient physical appearance/behavior: Pt was calm, cooperative, alert and seemed fully oriented. Pt did not appear to be responding to internal stimuli, experiencing delusional  thinking or to be intoxicated. Pt's speech and movement seemed normal. Pt's mood was dysphoric, and had a flat affect which was congruent. Pt's judgment and insight seemed within normal limits.  What Do You Feel Would Help You the Most Today? Treatment for Depression or other mood problem  If access to [facility] was not available, would you have sought care in the Emergency Department? Yes  Determination of Need Emergent (2 hours) (Per Shuvon Rankin NP, pt is recommended for Inpatient psychiatric treatment.)  Options For Referral Inpatient Hospitalization   Christina Hull, [place], [facility], [facility] Triage Specialist [facility]

## 2022-04-12 NOTE — ED Notes (Signed)
Report called to Systems developer at [facility]. Safe transport called for transportation services to [facility].

## 2022-04-12 NOTE — ED Notes (Signed)
Discharge instructions provided and Pt stated understanding. Pt alert, orient and ambulatory prior to d/c from facility. Personal belongings returned from locker number 7. Safe transport called for transportation services. Report previously called. Pt escorted to the sally port. Safety maintained.

## 2022-04-12 NOTE — ED Notes (Signed)
Called [facility] to cancel transportation services to [facility] at this time.
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
## 2022-04-12 NOTE — ED Provider Notes (Cosign Needed Addendum)
[facility] Continuous Assessment Admission H&P  Date: 04/12/22 Patient Name: Christina Hull MRN: [id number] Chief Complaint: Suicidal ideation, depression  Diagnoses:  Final diagnoses:  MDD (major depressive disorder), recurrent severe, without psychosis ([place])  Suicidal ideation    HPI: Christina Hull 32 y.o., female patient admitted to [facility] continuous observation unit after presenting as a walk in accompanied by her husband with complaints of depression and suicide attempt.    Patient seen face to face by this provider, consulted with Dr. Hampton Abbot; and chart reviewed on 04/12/22.  On evaluation Christina Hull reports  She has a chronic history of depression and suicidal ideation.  Patient reports after a verbal argument with her mother today she had "a somewhat attempt.  I was going to stab myself with a kitchen knife, but it didn't work out quite like I wanted it to.  So, I was going to overdose on all of my pills, but I called my husband and talk to him and I stopped."  Patient states that this is the first time she has attempted suicide and she also denies a history of self-harm.  Patient is unable to contract for safety and states that she continues to have suicidal ideation with plan to overdose on her medication.  When asked what the stressors were for her depress and suicidal ideation she states "I don't know.  Sometimes there really isn't a stressor that I know of.  I guess today it was the argument with my mother.  Patient states that she also has a problem with alcohol drinking 2 to 3 times a week "mostly wine, then liquor, then beer."  Reports history of passing out when she drinks but not all the time.  Last time she passed out was about month ago.  Patient denies homicidal ideation, psychosis, and paranoia.    During evaluation Christina Hull is sitting up right in chair beside her husband.  There is no noted distress.  She is alert/oriented x 4, calm, cooperative,  and attentive.  Her were responses were appropriate to assessment questions.  Her mood is depressed and anxious with congruent affect.  She spoke in a clear tone at moderate volume, and normal pace, with good eye contact.   She denies homicidal ideation, psychosis, and paranoia but, continues to endorse suicidal ideation with plan to overdose on her medications.  Patient unable to contract for safety.  Objectively:  there is no evidence of psychosis/mania or delusional thinking.  She conversed coherently, with goal directed thoughts, and no distractibility, or pre-occupation.  Recommended for inpatient psychiatric treatment.   of Medicaid card to speak with care coordinator  Total Time spent with patient: 30 minutes  Musculoskeletal  Strength & Muscle Tone: within normal limits Gait & Station: normal Patient leans: N/A  Psychiatric Specialty Exam  Presentation General Appearance:  Appropriate for Environment  Eye Contact: Good  Speech: Clear and Coherent; Normal Rate  Speech Volume: Normal  Handedness: Right   Mood and Affect  Mood: Anxious; Depressed; Hopeless  Affect: Congruent   Thought Process  Thought Processes: Coherent; Goal Directed  Descriptions of Associations:Intact  Orientation:Full (Time, Place and Person)  Thought Content:Logical    Hallucinations:Hallucinations: None  Ideas of Reference:None  Suicidal Thoughts:Suicidal Thoughts: Yes, Active SI Active Intent and/or Plan: With Intent; With Plan; With Means to Carry Out  Homicidal Thoughts:Homicidal Thoughts: No   Sensorium  Memory: Immediate Good; Recent Good; Remote Good  Judgment: Fair  Insight: Fair  Executive Functions  Concentration: Good  Attention Span: Good  Recall: Good  Fund of Knowledge: Good  Language: Good   Psychomotor Activity  Psychomotor Activity: Psychomotor Activity: Normal   Assets  Assets: Communication Skills; Desire for Improvement;  Financial Resources/Insurance; Housing; Intimacy; Leisure Time; Resilience; Social Support   Sleep  Sleep: Sleep: Fair   Nutritional Assessment (For OBS and FBC admissions only) Has the patient had a weight loss or gain of 10 pounds or more in the last 3 months?: No Has the patient had a decrease in food intake/or appetite?: No Does the patient have dental problems?: No Does the patient have eating habits or behaviors that may be indicators of an eating disorder including binging or inducing vomiting?: No Has the patient recently lost weight without trying?: 0 Has the patient been eating poorly because of a decreased appetite?: 0 Malnutrition Screening Tool Score: 0    Physical Exam Vitals and nursing note reviewed.  Constitutional:      General: She is not in acute distress.    Appearance: Normal appearance. She is not ill-appearing.  HENT:     Head: Normocephalic.  Eyes:     Conjunctiva/sclera: Conjunctivae normal.  Cardiovascular:     Rate and Rhythm: Normal rate.  Pulmonary:     Effort: Pulmonary effort is normal.  Musculoskeletal:        General: Normal range of motion.     Cervical back: Normal range of motion.  Skin:    General: Skin is warm and dry.  Neurological:     Mental Status: She is alert and oriented to person, place, and time.  Psychiatric:        Attention and Perception: Attention and perception normal. She does not perceive visual hallucinations.        Mood and Affect: Mood is anxious and depressed.        Speech: Speech normal.        Behavior: Behavior normal. Behavior is cooperative.        Thought Content: Thought content is delusional. Thought content is not paranoid. Thought content includes suicidal ideation. Thought content does not include homicidal ideation. Thought content includes suicidal plan.        Judgment: Judgment is impulsive.    Review of Systems  Skin:        States there is no mark from knife when she held it to her  stomach.   Psychiatric/Behavioral:  Positive for depression, substance abuse (Alcohol) and suicidal ideas. Negative for hallucinations. The patient is nervous/anxious.   All other systems reviewed and are negative.   Blood pressure 128/86, pulse 94, temperature 98.6 F (37 C), resp. rate 19, SpO2 98 %. There is no height or weight on file to calculate BMI.  Past Psychiatric History: Depression, anxiety   Is the patient at risk to self? Yes  Has the patient been a risk to self in the past 6 months? No .    Has the patient been a risk to self within the distant past? No   Is the patient a risk to others? No   Has the patient been a risk to others in the past 6 months? No   Has the patient been a risk to others within the distant past? No   Past Medical History: None reported  Family History: None reported  Social History: Married, lives with husband  Last Labs:  Lab on 02/08/2022  Component Date Value Ref Range Status   HIV 1&2 Ab,  4th Generation 02/08/2022 NON-REACTIVE  NON-REACTIVE Final   Comment: HIV-1 antigen and HIV-1/HIV-2 antibodies were not detected. There is no laboratory evidence of HIV infection. Marland Kitchen PLEASE NOTE: This information has been disclosed to you from records whose confidentiality may be protected by state law.  If your state requires such protection, then the state law prohibits you from making any further disclosure of the information without the specific written consent of the person to whom it pertains, or as otherwise permitted by law. A general authorization for the release of medical or other information is NOT sufficient for this purpose. . For additional information please refer to http://education.quest[employer].com/faq/FAQ106 (This link is being provided for informational/ educational purposes only.) . Marland Kitchen The performance of this assay has not been clinically validated in patients less than 3 years old. Marland Kitchen    TSH 02/08/2022 1.27  0.35 -  5.50 uIU/mL Final   Cholesterol 02/08/2022 165  0 - 200 mg/dL Final   ATP III Classification       Desirable:  < 200 mg/dL               Borderline High:  200 - 239 mg/dL          High:  > = 637 mg/dL   Triglycerides 85/88/5027 115.0  0.0 - 149.0 mg/dL Final   Normal:  <741 mg/dLBorderline High:  150 - 199 mg/dL   HDL 28/78/6767 20.94  >39.00 mg/dL Final   VLDL 70/96/2836 23.0  0.0 - 40.0 mg/dL Final   LDL Cholesterol 02/08/2022 87  0 - 99 mg/dL Final   Total CHOL/HDL Ratio 02/08/2022 3   Final                  Men          Women1/2 Average Risk     3.4          3.3Average Risk          5.0          4.42X Average Risk          9.6          7.13X Average Risk          15.0          11.0                       NonHDL 02/08/2022 110.20   Final   NOTE:  Non-HDL goal should be 30 mg/dL higher than patient's LDL goal (i.e. LDL goal of < 70 mg/dL, would have non-HDL goal of < 100 mg/dL)   Hgb O2H MFr Bld 47/65/4650 5.2  4.6 - 6.5 % Final   Glycemic Control Guidelines for People with Diabetes:Non Diabetic:  <6%Goal of Therapy: <7%Additional Action Suggested:  >8%    Sodium 02/08/2022 140  135 - 145 mEq/L Final   Potassium 02/08/2022 5.2 (H)  3.5 - 5.1 mEq/L Final   Chloride 02/08/2022 104  96 - 112 mEq/L Final   CO2 02/08/2022 28  19 - 32 mEq/L Final   Glucose, Bld 02/08/2022 97  70 - 99 mg/dL Final   BUN 35/46/5681 15  6 - 23 mg/dL Final   Creatinine, Ser 02/08/2022 0.58  0.40 - 1.20 mg/dL Final   Total Bilirubin 02/08/2022 0.3  0.2 - 1.2 mg/dL Final   Alkaline Phosphatase 02/08/2022 66  39 - 117 U/L Final   AST 02/08/2022 16  0 - 37 U/L Final  ALT 02/08/2022 16  0 - 35 U/L Final   Total Protein 02/08/2022 6.8  6.0 - 8.3 g/dL Final   Albumin 02/08/2022 4.4  3.5 - 5.2 g/dL Final   GFR 02/08/2022 121.81  >60.00 mL/min Final   Calculated using the CKD-EPI Creatinine Equation (2021)   Calcium 02/08/2022 9.2  8.4 - 10.5 mg/dL Final   WBC 02/08/2022 5.3  4.0 - 10.5 K/uL Final   RBC 02/08/2022 4.72   3.87 - 5.11 Mil/uL Final   Platelets 02/08/2022 280.0  150.0 - 400.0 K/uL Final   Hemoglobin 02/08/2022 14.8  12.0 - 15.0 g/dL Final   HCT 02/08/2022 42.5  36.0 - 46.0 % Final   MCV 02/08/2022 90.0  78.0 - 100.0 fl Final   MCHC 02/08/2022 35.0  30.0 - 36.0 g/dL Final   RDW 02/08/2022 12.9  11.5 - 15.5 % Final  Abstract on 12/10/2021  Component Date Value Ref Range Status   HM Hepatitis Screen 10/28/2021 Negative-Validated   Final   Hemoglobin 10/28/2021 14.7  12.0 - 16.0 Final   HCT 10/28/2021 44  36 - 46 Final   Neutrophils Absolute 10/28/2021 5.50   Final   Platelets 10/28/2021 299  150 - 400 K/uL Final   WBC 10/28/2021 7.8   Final   RBC 10/28/2021 4.84  3.87 - 5.11 Final   Glucose 10/28/2021 89   Final   BUN 10/28/2021 17  4 - 21 Final   CO2 10/28/2021 25 (A)  13 - 22 Final   Creatinine 10/28/2021 0.7  0.5 - 1.1 Final   Sodium 10/28/2021 140  137 - 147 Final   Chloride 10/28/2021 102  99 - 108 Final   Globulin 10/28/2021 2.6   Final   eGFR 10/28/2021 121   Final   Calcium 10/28/2021 9.6  8.7 - 10.7 Final   Albumin 10/28/2021 4.6  3.5 - 5.0 Final   Alkaline Phosphatase 10/28/2021 76  25 - 125 Final   ALT 10/28/2021 15  7 - 35 U/L Final   AST 10/28/2021 20  13 - 35 Final   Bilirubin, Total 10/28/2021 0.2   Final   Hepatitis B Surface Ag 10/28/2021 negative   Final    Allergies: Patient has no known allergies.  Medications:  Facility Ordered Medications  Medication   acetaminophen (TYLENOL) tablet 650 mg   alum & mag hydroxide-simeth (MAALOX/MYLANTA) 200-200-20 MG/5ML suspension 30 mL   magnesium hydroxide (MILK OF MAGNESIA) suspension 30 mL   traZODone (DESYREL) tablet 50 mg   hydrOXYzine (ATARAX) tablet 25 mg   DULoxetine (CYMBALTA) DR capsule 30 mg   PTA Medications  Medication Sig   DULoxetine (CYMBALTA) 30 MG capsule TAKE 1 CAPSULE BY MOUTH DAILY   tiZANidine (ZANAFLEX) 4 MG tablet TAKE ONE TABLET BY MOUTH EVERY 6 HOURS AS NEEDED FOR MUSCLE SPASMS (Patient taking  differently: Take 4 mg by mouth every 6 (six) hours as needed for muscle spasms.)   Adalimumab (HUMIRA, 2 PEN,) 40 MG/0.4ML PNKT Inject 40 mg into the skin every 14 (fourteen) days.    Medical Decision Making  Christina Hull was admitted to [facility] continuous assessment unit for MDD (major depressive disorder), recurrent severe, without psychosis ([place]), crisis management, and stabilization. Routine labs ordered, which include Lab Orders         Resp panel by RT-PCR (RSV, Flu A&B, Covid) Anterior Nasal Swab         CBC with Differential/Platelet  Comprehensive metabolic panel         Hemoglobin A1c         Magnesium         Ethanol         Lipid panel         TSH         Prolactin         Urinalysis, Routine w reflex microscopic -Urine, Clean Catch         POC urine preg, ED         POCT Urine Drug Screen - (I-Screen)     Medication Management: Medications started Meds ordered this encounter  Medications   acetaminophen (TYLENOL) tablet 650 mg   alum & mag hydroxide-simeth (MAALOX/MYLANTA) 200-200-20 MG/5ML suspension 30 mL   magnesium hydroxide (MILK OF MAGNESIA) suspension 30 mL   traZODone (DESYREL) tablet 50 mg   hydrOXYzine (ATARAX) tablet 25 mg   DULoxetine (CYMBALTA) DR capsule 30 mg    Will maintain continuous observation for safety. Recommended for inpatient psychiatric treatment.  Social work to look for appropriate bed.      Recommendations  Based on my evaluation the patient does not appear to have an emergency medical condition.  Alya Smaltz has been accepted to [facility] 305-1 pending labs, covid, UDS, and voluntary consent faxed to [phone]. Once resulted report can be called to [phone].   Tyhir Schwan, NP 04/12/22  3:18 PM

## 2022-04-12 NOTE — Progress Notes (Signed)
Patient rated her day as a 2 out of 10 since she had a very bad day. She did not elaborate any further. Her goal for tomorrow is to spend less time in her bedroom.

## 2022-04-13 ENCOUNTER — Other Ambulatory Visit: Payer: Self-pay

## 2022-04-13 ENCOUNTER — Encounter (HOSPITAL_COMMUNITY): Payer: Self-pay | Admitting: Registered Nurse

## 2022-04-13 ENCOUNTER — Encounter (HOSPITAL_COMMUNITY): Payer: Self-pay

## 2022-04-13 MED ORDER — DULOXETINE HCL 20 MG PO CPEP
40.0000 mg | ORAL_CAPSULE | Freq: Every day | ORAL | Status: DC
  Administered 2022-04-14 – 2022-04-15 (×2): 40 mg via ORAL
  Filled 2022-04-13 (×3): qty 2

## 2022-04-13 NOTE — Progress Notes (Signed)
ADMISSION NOTE:  Christina Hull is a 32 year old female, that was transferred from [facility]. Patient c/o having a fight with her mother tonight, did not go into detail, and stated "I just felt like I could not go on further today after the fight."  Patient also states, "my husband knows that I have been suffering with suicidal ideations for a while now so I texted him and told him that I couldn't do it anymore, I am sorry, and that I loved him." Patient then states that husband talked to over there security camera until her arrived home and then took her to  [facility] Patient admits to having a plan to stab her self in the stomach with a knife, and she attempted tonight before her husband saw her on the camera. Patient reports, "I would say I have been dealing with suicidal ideation for majority of my life, but this is my first actual attempt." Patient is visibly upset at this time and starts to cry. Support and encouragement given to patient by this RN. Patient contracts for safety at this time. Patient given nourishment and hydration. Patient made aware of q15 min safety checks. Patient denies having questions at this time.

## 2022-04-13 NOTE — [facility] Group Notes (Signed)
Adult Psychoeducational Group Note  Date:  04/13/2022 Time:  3:07 PM  Group Topic/Focus:  Goals Group:   The focus of this group is to help patients establish daily goals to achieve during treatment and discuss how the patient can incorporate goal setting into their daily lives to aide in recovery.  Participation Level:  Active  Participation Quality:  Attentive  Affect:  Appropriate  Cognitive:  Appropriate  Insight: Appropriate  Engagement in Group:  Engaged  Modes of Intervention:  Discussion  Additional Comments:  Patient attended goals group and was attentive the duration of it. Patient's goal was to get a discharge plan.   Veva Grimley T Cordie Buening 04/13/2022, 3:07 PM

## 2022-04-13 NOTE — Progress Notes (Signed)
   04/13/22 1100  Psych Admission Type (Psych Patients Only)  Admission Status Voluntary  Psychosocial Assessment  Patient Complaints Anxiety  Eye Contact Fair  Facial Expression Anxious  Affect Appropriate to circumstance  Speech Logical/coherent  Interaction Minimal  Motor Activity Other (Comment)  Appearance/Hygiene Unremarkable  Behavior Characteristics Cooperative  Mood Anxious;Depressed  Thought Process  Coherency WDL  Content WDL  Delusions None reported or observed  Perception WDL  Hallucination None reported or observed  Judgment Limited  Confusion Severe  Danger to Self  Current suicidal ideation? Passive  Danger to Others  Danger to Others None reported or observed

## 2022-04-13 NOTE — Progress Notes (Signed)
   04/13/22 0600  15 Minute Checks  Location Bedroom  Visual Appearance Calm  Behavior Sleeping  Sleep (Behavioral Health Patients Only)  Calculate sleep? (Click Yes once per 24 hr at 0600 safety check) Yes  Documented sleep last 24 hours 7.5

## 2022-04-13 NOTE — Progress Notes (Signed)
Adult Psychoeducational Group Note  Date:  04/13/2022 Time:  9:50 PM  Group Topic/Focus:  Wrap-Up Group:   The focus of this group is to help patients review their daily goal of treatment and discuss progress on daily workbooks.  Participation Level:  Active  Participation Quality:  Attentive and Sharing  Affect:  Appropriate  Cognitive:  Appropriate  Insight: Appropriate  Engagement in Group:  Engaged  Modes of Intervention:  Discussion, Rapport Building, and Support  Additional Comments:   Pt attended and engaged in the [place] group. Pt endorsed SI with no plan. Pt encouraged to reach out to staff for support. Pt mood 4/10. Pt shared that she has made some progress with getting out her room. Pt receptive to verbal praise and encouragement given by MHT and her peers. Pt shared that being more social and reading has helped to improve her mood and coping.   Wetzel Bjornstad Noris Kulinski 04/13/2022, 9:50 PM

## 2022-04-13 NOTE — [facility] Group Notes (Signed)
Adult Psychoeducational Group Note  Date:  04/13/2022 Time:  7:04 PM  Group Topic/Focus:  BING: Helps to improve memory and other cognitive skills, as well as strengthen focus and concentration.   Participation Level:  Did Not Attend  Christina Hull 04/13/2022, 7:04 PM

## 2022-04-13 NOTE — H&P (Signed)
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 are moist.     Pharynx: Oropharynx is clear.  Eyes:     Conjunctiva/sclera: Conjunctivae normal.     Pupils: Pupils are equal, round, and reactive to light.  Cardiovascular:      Rate and Rhythm: Tachycardia present.  Pulmonary:     Effort: Pulmonary effort is normal.  Abdominal:     Palpations: Abdomen is soft.  Genitourinary:    Comments: Deferred Musculoskeletal:        General: Normal range of motion.     Cervical back: Normal range of motion.  Skin:    General: Skin is warm.  Neurological:     General: No focal deficit present.     Mental Status: She is alert and oriented to person, place, and time.  Psychiatric:        Behavior: Behavior normal.    Review of Systems  Constitutional: Negative.   HENT: Negative.    Eyes: Negative.   Respiratory: Negative.    Cardiovascular: Negative.   Gastrointestinal: Negative.   Genitourinary: Negative.   Musculoskeletal: Negative.        Patient has history of Psoriatec arthritis x 5 months  Skin: Negative.   Endo/Heme/Allergies: Negative.   Psychiatric/Behavioral:  Positive for depression, substance abuse and suicidal ideas. The patient is nervous/anxious.    Blood pressure 117/81, pulse (!) 105, temperature 98.8 F (37.1 C), temperature source Oral, resp. rate 17, SpO2 97 %. There is no height or weight on file to calculate BMI.   Observation Level/Precautions:  15 minute checks  Laboratory:  CBC Chemistry Profile HbAIC HCG UDS UA  Psychotherapy: Therapeutic milieu  Medications: See MAR  Consultations: Social work  Discharge Concerns: Safety  Estimated LOS: 5 to 7 days  Other:     Treatment Plan Summary: Daily contact with patient to assess and evaluate symptoms and progress in treatment and Medication management  Physician Treatment  Plan for Primary Diagnosis: MDD (major depressive disorder), recurrent severe, without psychosis (HCC)   Long Term Goal(s): Improvement in symptoms so as ready for discharge  Short Term Goals: Ability to identify changes in lifestyle to reduce recurrence of condition will improve, Ability to verbalize feelings will improve, Ability to disclose and discuss  suicidal ideas, Ability to demonstrate self-control will improve, Ability to identify and develop effective coping behaviors will improve, Ability to maintain clinical measurements within normal limits will improve, Compliance with prescribed medications will improve, and Ability to identify triggers associated with substance abuse/mental health issues will improve   Physician Treatment Plan for Secondary Diagnosis: Principal Problem:   MDD (major depressive disorder), recurrent severe, without psychosis (HCC)  PLAN: MDD (major depressive disorder), recurrent severe, without psychosis --Initiate duloxetine (Cymbalta) DR capsule 40 mg p.o. daily -- Placed on hold at this time dextro meth upon-bupropion ER 45-105 mg T BCR 1 tablets p.o. 2 times daily  Anxiety -Initiate hydroxyzine 25 mg 3 times daily as needed/anxiety   Insomnia -Initiate trazodone 50 mg 1 tablet p.o. nightly as needed  Other PRN Medications -Acetaminophen 650 mg every 6 as needed/mild pain -Maalox 30 mL oral every 4 as needed/digestion -Magnesium hydroxide 30 mL daily as needed/mild constipation -- Tizanidine tablets 4 mg p.o. every 6 hours as needed muscle spasm  Safety and Monitoring: Voluntary admission to inpatient psychiatric unit for safety, stabilization and treatment Daily contact with patient to assess and evaluate symptoms and progress in treatment Patient's case to be discussed in multi-disciplinary team meeting Observation Level : q15 minute checks Vital signs: q12 hours Precautions: suicide, but pt currently verbally contracts for safety on unit    Discharge Planning: Social work and case management to assist with discharge planning and identification of hospital follow-up needs prior to discharge Estimated LOS: 5-7 days Discharge Concerns: Need to establish a safety plan; Medication compliance and effectiveness Discharge Goals: Return home with outpatient referrals for mental health follow-up including  medication management/psychotherapy.  I certify that inpatient services furnished can reasonably be expected to improve the patient's condition.    Cecilie Lowersina C Oliwia Berzins, FNP 1/31/202412:58 PM

## 2022-04-13 NOTE — Progress Notes (Signed)
   04/13/22 0000  [place] Suicide Severity Rating Scale  1. Wish to be Dead Yes  2. Suicidal Thoughts Yes  3. Suicidal Thoughts with Method Without Specific Plan or Intent to Act No  4. Suicidal Intent Without Specific Plan No  5. Suicide Intent with Specific Plan Yes  6. Suicide Behavior Question Yes  7. How long ago did you do any of these? Over a year ago  [place]  Patient location: Behavioral Health In-patient Unit  [facility] Suicide Precaution Interventions  [place] Suicide Bundle Interventions High Risk Interventions implemented  [place] Suicide Severity Rating Scale-Reassessment  2. Suicidal Thoughts Yes  3. Suicidal Thoughts with Method Without Specific Plan or Intent to Act Yes  4. Suicidal Intent Without Specific Plan No  5. Suicide Intent with Specific Plan No  6. Suicide Behavior Question Yes  C-SSRS Reassessment Risk Category Score High Risk  [facility] Suicide Reassessment Precaution Interventions  [facility] Suicide Reassessment Bundle Interventions High Risk Interventions implemented  Danger to Self  Current suicidal ideation? Active  Description of Suicide Plan cut herself or overdose on medication  Agreement Not to Harm Self Yes  Description of Agreement verbal

## 2022-04-13 NOTE — BH IP Treatment Plan (Signed)
Interdisciplinary Treatment and Diagnostic Plan Update  04/13/2022 Time of Session: 9:15am  Christina Hull MRN: [id number]  Principal Diagnosis: MDD (major depressive disorder), recurrent severe, without psychosis ([place])  Secondary Diagnoses: Principal Problem:   MDD (major depressive disorder), recurrent severe, without psychosis ([place])   Current Medications:  Current Facility-Administered Medications  Medication Dose Route Frequency Provider Last Rate Last Admin   acetaminophen (TYLENOL) tablet 650 mg  650 mg Oral Q6H PRN Rankin, Shuvon B, NP       alum & mag hydroxide-simeth (MAALOX/MYLANTA) 200-200-20 MG/5ML suspension 30 mL  30 mL Oral Q4H PRN Rankin, Shuvon B, NP       Dextromethorphan-buPROPion ER 45-105 MG TBCR 1 tablet  1 tablet Oral BID Rankin, Shuvon B, NP       DULoxetine (CYMBALTA) DR capsule 30 mg  30 mg Oral Daily Rankin, Shuvon B, NP   30 mg at 04/13/22 0831   hydrOXYzine (ATARAX) tablet 25 mg  25 mg Oral TID PRN Rankin, Shuvon B, NP       magnesium hydroxide (MILK OF MAGNESIA) suspension 30 mL  30 mL Oral Daily PRN Rankin, Shuvon B, NP       tiZANidine (ZANAFLEX) tablet 4 mg  4 mg Oral Q6H PRN Rankin, Shuvon B, NP       traZODone (DESYREL) tablet 50 mg  50 mg Oral QHS PRN Rankin, Shuvon B, NP       PTA Medications: Medications Prior to Admission  Medication Sig Dispense Refill Last Dose   Adalimumab (HUMIRA, 2 PEN,) 40 MG/0.4ML PNKT Inject 40 mg into the skin every 14 (fourteen) days.      Dextromethorphan-buPROPion ER (AUVELITY) 45-105 MG TBCR Take 1 tablet by mouth See admin instructions. Take one tablet daily for 1 week and then one tablet twice daily.      DULoxetine (CYMBALTA) 30 MG capsule TAKE 1 CAPSULE BY MOUTH DAILY 30 capsule 1    ibuprofen (ADVIL) 200 MG tablet Take 400 mg by mouth every 6 (six) hours as needed (For chronic pain).      tiZANidine (ZANAFLEX) 4 MG tablet TAKE ONE TABLET BY MOUTH EVERY 6 HOURS AS NEEDED FOR MUSCLE SPASMS (Patient taking  differently: Take 4 mg by mouth every 6 (six) hours as needed for muscle spasms.) 30 tablet 1     Patient Stressors:    Patient Strengths:    Treatment Modalities: Medication Management, Group therapy, Case management,  1 to 1 session with clinician, Psychoeducation, Recreational therapy.   Physician Treatment Plan for Primary Diagnosis: MDD (major depressive disorder), recurrent severe, without psychosis ([place]) Long Term Goal(s):     Short Term Goals:    Medication Management: Evaluate patient's response, side effects, and tolerance of medication regimen.  Therapeutic Interventions: 1 to 1 sessions, Unit Group sessions and Medication administration.  Evaluation of Outcomes: Not Met  Physician Treatment Plan for Secondary Diagnosis: Principal Problem:   MDD (major depressive disorder), recurrent severe, without psychosis ( [place])  Long Term Goal(s):     Short Term Goals:       Medication Management: Evaluate patient's response, side effects, and tolerance of medication regimen.  Therapeutic Interventions: 1 to 1 sessions, Unit Group sessions and Medication administration.  Evaluation of Outcomes: Not Met   RN Treatment Plan for Primary Diagnosis: MDD (major depressive disorder), recurrent severe, without psychosis ([place]) Long Term Goal(s): Knowledge of disease and therapeutic regimen to maintain health will improve  Short Term Goals: Ability to remain free from injury will improve, Ability  to participate in decision making will improve, Ability to verbalize feelings will improve, Ability to disclose and discuss suicidal ideas, and Ability to identify and develop effective coping behaviors will improve  Medication Management: RN will administer medications as ordered by provider, will assess and evaluate patient's response and provide education to patient for prescribed medication. RN will report any adverse and/or side effects to prescribing provider.  Therapeutic Interventions:  1 on 1 counseling sessions, Psychoeducation, Medication administration, Evaluate responses to treatment, Monitor vital signs and CBGs as ordered, Perform/monitor CIWA, COWS, AIMS and Fall Risk screenings as ordered, Perform wound care treatments as ordered.  Evaluation of Outcomes: Not Met   LCSW Treatment Plan for Primary Diagnosis: MDD (major depressive disorder), recurrent severe, without psychosis ([place]) Long Term Goal(s): Safe transition to appropriate next level of care at discharge, Engage patient in therapeutic group addressing interpersonal concerns.  Short Term Goals: Engage patient in aftercare planning with referrals and resources, Increase social support, Increase emotional regulation, Facilitate acceptance of mental health diagnosis and concerns, Identify triggers associated with mental health/substance abuse issues, and Increase skills for wellness and recovery  Therapeutic Interventions: Assess for all discharge needs, 1 to 1 time with Social worker, Explore available resources and support systems, Assess for adequacy in community support network, Educate family and significant other(s) on suicide prevention, Complete Psychosocial Assessment, Interpersonal group therapy.  Evaluation of Outcomes: Not Met   Progress in Treatment: Attending groups: Yes. Participating in groups: Yes. Taking medication as prescribed: Yes. Toleration medication: Yes. Family/Significant other contact made: Yes, individual(s) contacted:  Husband  Patient understands diagnosis: Yes. Discussing patient identified problems/goals with staff: Yes. Medical problems stabilized or resolved: Yes. Denies suicidal/homicidal ideation: Yes. Issues/concerns per patient self-inventory: No.   New problem(s) identified: No, Describe:  None  New Short Term/Long Term Goal(s): medication stabilization, elimination of SI thoughts, development of comprehensive mental wellness plan.   Patient Goals:  Did not attend    Discharge Plan or Barriers: Patient recently admitted. CSW will continue to follow and assess for appropriate referrals and possible discharge planning.   Reason for Continuation of Hospitalization: Anxiety Depression Medication stabilization Suicidal ideation Withdrawal symptoms  Estimated Length of Stay: 3 to 7 days   Last [place] Suicide Severity Risk Score: [place] Admission (Current) from 04/12/2022 in [facility] Most recent reading at 04/13/2022 12:00 AM ED from 04/12/2022 in [facility] Most recent reading at 04/12/2022  5:32 PM  C-SSRS RISK CATEGORY High Risk High Risk       Last PHQ 2/9 Scores:    04/12/2022    3:14 PM 02/01/2022    8:16 AM 05/26/2020   11:55 AM  Depression screen PHQ 2/9  Decreased Interest 3 0 0  Down, Depressed, Hopeless 3 0 0  PHQ - 2 Score 6 0 0  Altered sleeping 2    Tired, decreased energy 3    Change in appetite 3    Feeling bad or failure about yourself  3    Trouble concentrating 2    Moving slowly or fidgety/restless 0    Suicidal thoughts 3    PHQ-9 Score 22    Difficult doing work/chores Somewhat difficult      Scribe for Treatment Team: Darleen Crocker, Latanya Presser 04/13/2022 1:26 PM

## 2022-04-13 NOTE — Progress Notes (Signed)
   04/12/22 2200  Psych Admission Type (Psych Patients Only)  Admission Status Voluntary  Psychosocial Assessment  Patient Complaints Anxiety;Depression;Self-harm thoughts  Eye Contact Fair  Facial Expression Anxious  Affect Appropriate to circumstance  Speech Logical/coherent  Interaction Assertive  Motor Activity Other (Comment) (WDL)  Appearance/Hygiene Unremarkable  Behavior Characteristics Cooperative  Mood Anxious;Depressed  Thought Process  Coherency WDL  Content WDL  Delusions None reported or observed  Perception WDL  Hallucination None reported or observed  Judgment Limited  Confusion None  Danger to Self  Current suicidal ideation? Passive  Description of Suicide Plan No plan at this time.  Self-Injurious Behavior No self-injurious ideation or behavior indicators observed or expressed   Agreement Not to Harm Self Yes  Description of Agreement Verbally contracts for safety.   Patient alert and oriented. Presenting appropriate to circumstance with an anxious, depressed mood. Patient denies HI, AVH. Patient endorses passive SI and states "I dont have a plan while I am here". Patient endorses anxiety 7/10 and depression 9/10. Patient refused PRN hydroxyzine and trazodone at this time. PRN ibuprofen administered for pain 4/10. Scheduled medication not given, patient at home med not supplied, NP notified. Support and encouragement provided. Routine safety checks conducted every 15 minutes. Patient verbally contracts for safety and remains safe on the unit.

## 2022-04-13 NOTE — [facility] Suicide Risk Assessment (Signed)
[facility] INPATIENT:  Family/Significant Other Suicide Prevention Education  Suicide Prevention Education:  Education Completed; 04-13-2022, Mercy Leppla [phone] (Husband) has been identified by the patient as the family member/significant other with whom the patient will be residing, and identified as the person(s) who will aid the patient in the event of a mental health crisis (suicidal ideations/suicide attempt).  With written consent from the patient, Christina Hull [phone] (Husband) has been provided the following suicide prevention education, prior to the and/or following the discharge of the patient.  The suicide prevention education provided includes the following: Suicide risk factors Suicide prevention and interventions National Suicide Hotline telephone number [facility] assessment telephone number [facility] Emergency Assistance [place] and/or Residential Mobile Crisis Unit telephone number  Request made of family/significant other to: Remove weapons (e.g., guns, rifles, knives), all items previously/currently identified as safety concern.   Remove drugs/medications (over-the-counter, prescriptions, illicit drugs), all items previously/currently identified as a safety concern.  Christina Hull [phone] (Husband) verbalizes understanding of the suicide prevention education information provided. The family member agrees to remove the items of safety concern listed above.  Christina Hull S Christina Hull 04/13/2022, 10:37 AM
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
## 2022-04-13 NOTE — Progress Notes (Signed)
Initial Treatment Plan Christina Hull. Melecio MRN: [id number] 04/12/2022 10:30 PM        PATIENT STRESSORS: Relationship with her mother  Long-term battle with suicidal ideations     PATIENT STRENGTHS: Coping skills Motivation for treatment/growth  Supportive family/friends       PATIENT IDENTIFIED PROBLEMS: "This is my first ever attempt so I know this is a problem."   Relationship with her mother    Suicidal thoughts    Depression    Anxiety                    DISCHARGE CRITERIA:  Ability to meet basic life and health needs Adequate communication with mother  Improved stabilization in mood, thinking, and/or behavior   PRELIMINARY DISCHARGE PLAN: Return to previous living arrangement   PATIENT/FAMILY INVOLVEMENT: This treatment plan has been presented to and reviewed with the patient, Christina Hull. The patient and family have been given the opportunity to ask questions and make suggestions.    Christina Hull Robert, RN  04/13/2022 12:45 AM

## 2022-04-13 NOTE — [facility] Suicide Risk Assessment (Cosign Needed Addendum)
Suicide Risk Assessment  Admission Assessment    [facility] Admission Suicide Risk Assessment   Nursing information obtained from:  Patient Demographic factors:  Caucasian Current Mental Status:  Suicidal ideation indicated by patient, Suicide plan, Self-harm thoughts, Belief that plan would result in death Loss Factors:  NA Historical Factors:   (prior suicidal ideation) Risk Reduction Factors:  Sense of responsibility to family, Employed, Positive social support, Living with another person, especially a relative  Total Time spent with patient: 30 minutes Principal Problem: MDD (major depressive disorder), recurrent severe, without psychosis ([place]) Diagnosis:  Principal Problem:   MDD (major depressive disorder), recurrent severe, without psychosis ([place])  Subjective Data: Christina Hull is a 32 year old Caucasian female with past medical history of major depressive disorder, recurrent severe without psychosis, anxiety, and suicidal ideation who presents to [facility] from [facility] behavioral health urgent care for worsening depressive symptoms resulting in increasing thought of self harming behavior.   Continued Clinical Symptoms:  Alcohol Use Disorder Identification Test Final Score (AUDIT): 13 The "Alcohol Use Disorders Identification Test", Guidelines for Use in Primary Care, Second Edition.  World Pharmacologist [facility]). Score between 0-7:  no or low risk or alcohol related problems. Score between 8-15:  moderate risk of alcohol related problems. Score between 16-19:  high risk of alcohol related problems. Score 20 or above:  warrants further diagnostic evaluation for alcohol dependence and treatment.   CLINICAL FACTORS:   Depression:   Anhedonia Hopelessness Impulsivity Insomnia Alcohol/Substance Abuse/Dependencies Chronic Pain More than one psychiatric diagnosis Previous Psychiatric Diagnoses and Treatments Medical Diagnoses and  Treatments/Surgeries  Musculoskeletal: Strength & Muscle Tone: within normal limits Gait & Station: normal Patient leans: N/A  Psychiatric Specialty Exam:  Presentation  General Appearance:  Appropriate for Environment; Casual  Eye Contact: Good  Speech: Clear and Coherent; Normal Rate  Speech Volume: Normal  Handedness: Right  Mood and Affect  Mood: Anxious; Depressed  Affect: Congruent; Appropriate  Thought Process  Thought Processes: Coherent; Goal Directed  Descriptions of Associations:Intact  Orientation:Full (Time, Place and Person)  Thought Content:Logical  History of Schizophrenia/Schizoaffective disorder:No  Duration of Psychotic Symptoms:No data recorded Hallucinations:Hallucinations: None  Ideas of Reference:None  Suicidal Thoughts:Suicidal Thoughts: Yes, Passive SI Active Intent and/or Plan: Without Intent; Without Plan; Without Access to Means SI Passive Intent and/or Plan: Without Intent; Without Plan; Without Access to Means  Homicidal Thoughts:Homicidal Thoughts: No   Sensorium  Memory: Immediate Fair; Recent Fair  Judgment: Fair  Insight: Fair  Executive Functions  Concentration: Good  Attention Span: Good  Recall: Good  Fund of Knowledge: Good  Language: Good  Psychomotor Activity  Psychomotor Activity: Psychomotor Activity: Normal  Assets  Assets: Communication Skills; Desire for Improvement; Financial Resources/Insurance; Housing; Physical Health; Social Support   Sleep  Sleep: Sleep: Good Number of Hours of Sleep: 8  Physical Exam: Physical Exam Vitals and nursing note reviewed.  HENT:     Head: Normocephalic.     Nose: Nose normal.     Mouth/Throat:     Mouth: Mucous membranes are moist.     Pharynx: Oropharynx is clear.  Eyes:     Conjunctiva/sclera: Conjunctivae normal.     Pupils: Pupils are equal, round, and reactive to light.  Cardiovascular:     Rate and Rhythm: Tachycardia  present.  Pulmonary:     Effort: Pulmonary effort is normal.  Abdominal:     Palpations: Abdomen is soft.  Genitourinary:    Comments: Deferred Musculoskeletal:  General: Normal range of motion.     Cervical back: Normal range of motion.  Skin:    General: Skin is warm.  Neurological:     General: No focal deficit present.     Mental Status: She is oriented to person, place, and time.  Psychiatric:        Behavior: Behavior normal.    Review of Systems  Constitutional: Negative.   HENT: Negative.    Eyes: Negative.   Respiratory: Negative.    Cardiovascular: Negative.   Gastrointestinal: Negative.   Genitourinary: Negative.   Musculoskeletal:  Positive for joint pain and myalgias.       Psoriatec arthritis x 5 months  Skin: Negative.   Neurological: Negative.   Endo/Heme/Allergies: Negative.   Psychiatric/Behavioral:  Positive for depression, substance abuse and suicidal ideas. The patient is nervous/anxious and has insomnia.    Blood pressure 117/81, pulse (!) 105, temperature 98.8 F (37.1 C), temperature source Oral, resp. rate 17, SpO2 97 %. There is no height or weight on file to calculate BMI.   COGNITIVE FEATURES THAT CONTRIBUTE TO RISK:  Polarized thinking    SUICIDE RISK:   Severe:  Frequent, intense, and enduring suicidal ideation, specific plan, no subjective intent, but some objective markers of intent (i.e., choice of lethal method), the method is accessible, some limited preparatory behavior, evidence of impaired self-control, severe dysphoria/symptomatology, multiple risk factors present, and few if any protective factors, particularly a lack of social support.  PLAN OF CARE:  Observation Level/Precautions:  15 minute checks  Laboratory:  CBC Chemistry Profile HbAIC HCG UDS UA  Psychotherapy: Therapeutic milieu  Medications: See MAR  Consultations: Social work  Discharge Concerns: Safety  Estimated LOS: 5 to 7 days  Other:      Treatment  Plan Summary: Daily contact with patient to assess and evaluate symptoms and progress in treatment and Medication management   Physician Treatment Plan for Primary Diagnosis: MDD (major depressive disorder), recurrent severe, without psychosis ([place])     Long Term Goal(s): Improvement in symptoms so as ready for discharge   Short Term Goals: Ability to identify changes in lifestyle to reduce recurrence of condition will improve, Ability to verbalize feelings will improve, Ability to disclose and discuss suicidal ideas, Ability to demonstrate self-control will improve, Ability to identify and develop effective coping behaviors will improve, Ability to maintain clinical measurements within normal limits will improve, Compliance with prescribed medications will improve, and Ability to identify triggers associated with substance abuse/mental health issues will improve     Physician Treatment Plan for Secondary Diagnosis: Principal Problem:   MDD (major depressive disorder), recurrent severe, without psychosis ([place])   PLAN: MDD (major depressive disorder), recurrent severe, without psychosis --Initiate duloxetine (Cymbalta) DR capsule 40 mg p.o. daily -- Placed on hold at this time dextro meth upon-bupropion ER 45-105 mg T BCR 1 tablets p.o. 2 times daily   Anxiety -Initiate hydroxyzine 25 mg 3 times daily as needed/anxiety   Insomnia -Initiate trazodone 50 mg 1 tablet p.o. nightly as needed   Other PRN Medications -Acetaminophen 650 mg every 6 as needed/mild pain -Maalox 30 mL oral every 4 as needed/digestion -Magnesium hydroxide 30 mL daily as needed/mild constipation -- Tizanidine tablets 4 mg p.o. every 6 hours as needed muscle spasm   Safety and Monitoring: Voluntary admission to inpatient psychiatric unit for safety, stabilization and treatment Daily contact with patient to assess and evaluate symptoms and progress in treatment Patient's case to be discussed in multi-disciplinary  team meeting Observation Level : q15 minute checks Vital signs: q12 hours Precautions: suicide, but pt currently verbally contracts for safety on unit    Discharge Planning: Social work and case management to assist with discharge planning and identification of hospital follow-up needs prior to discharge Estimated LOS: 5-7 days Discharge Concerns: Need to establish a safety plan; Medication compliance and effectiveness Discharge Goals: Return home with outpatient referrals for mental health follow-up including medication management/psychotherapy.    I certify that inpatient services furnished can reasonably be expected to improve the patient's condition.   Laretta Bolster, FNP 04/13/2022, 1:50 PM

## 2022-04-13 NOTE — Progress Notes (Signed)
   04/13/22 0200  Psych Admission Type (Psych Patients Only)  Admission Status Voluntary  Psychosocial Assessment  Patient Complaints Anxiety;Self-harm thoughts;Crying spells;Depression  Eye Contact Fair  Facial Expression Anxious;Sad  Affect Appropriate to circumstance;Depressed;Anxious;Sad  Speech Logical/coherent  Interaction Isolative;Minimal  Motor Activity Other (Comment) (stayed in bed/room)  Appearance/Hygiene Unremarkable  Behavior Characteristics Cooperative;Appropriate to situation;Anxious  Mood Anxious;Depressed;Sad;Worthless, low self-esteem  Thought Process  Coherency WDL  Content WDL  Delusions None reported or observed  Perception WDL  Hallucination None reported or observed  Judgment Limited  Confusion Severe  Danger to Self  Current suicidal ideation? Active  Description of Suicide Plan cut herself or overdose on her medicationa  Self-Injurious Behavior Some self-injurious ideation observed or expressed.  No lethal plan expressed   Agreement Not to Harm Self Yes  Description of Agreement verbal  Danger to Others  Danger to Others None reported or observed

## 2022-04-14 LAB — PROLACTIN: Prolactin: 10.9 ng/mL (ref 4.8–33.4)

## 2022-04-14 NOTE — Group Note (Signed)
Date:  04/14/2022 Time:  2:18 PM  Group Topic/Focus:  Wellness Toolbox:   The focus of this group is to discuss various aspects of wellness, balancing those aspects and exploring ways to increase the ability to experience wellness.  Patients will create a wellness toolbox for use upon discharge.    Participation Level:  Active  Participation Quality:  Appropriate  Affect:  Appropriate  Cognitive:  Appropriate  Insight: Appropriate  Engagement in Group:  Engaged  Modes of Intervention:  Exploration  Patient was able to identify  being more social in order to get out of her comfort zone, which would make  her less isolated.   Christina Hull 04/14/2022, 2:18 PM

## 2022-04-14 NOTE — [facility] Group Notes (Signed)
Adult Psychoeducational Group Note  Date:  04/14/2022 Time:  8:44 PM  Group Topic/Focus:  Wrap-Up Group:   The focus of this group is to help patients review their daily goal of treatment and discuss progress on daily workbooks.  Participation Level:  Active  Participation Quality:  Attentive  Affect:  Appropriate  Cognitive:  Appropriate  Insight: Appropriate  Engagement in Group:  Engaged  Modes of Intervention:  Discussion  Additional Comments:  Patient attended and participated in the [place] group.  Christina Hull 04/14/2022, 8:44 PM

## 2022-04-14 NOTE — Progress Notes (Addendum)
D:  Patient's self inventory sheet, patient sleeps good, no sleep medication.  Good appetite, low energy level, poor concentration.  Rate depression 6, hopeless 7, anxiety 3.  Denied withdrawals.  SI, no plan, contracts for safety.  Physical problems today.  Pain, mild back pain, worst pain #3.   Goal is talk to new person,  stay occupied.  Goal is color, finish my  book.  SI thoughts are less today.   No discharge plans. A:  Medications administered per MD orders.  Emotional support and encouragements given patient. R  Denied HI.  Denied A/V hallucinations.  SI, contracts for safety.   Safety maintained with 15 minute checks.

## 2022-04-14 NOTE — Plan of Care (Signed)
Nurse discussed coping skills with patient.  

## 2022-04-14 NOTE — Progress Notes (Addendum)
   04/13/22 2030  Psych Admission Type (Psych Patients Only)  Admission Status Voluntary  Psychosocial Assessment  Patient Complaints Anxiety;Depression  Eye Contact Fair  Facial Expression Anxious  Affect Appropriate to circumstance  Speech Logical/coherent  Interaction Minimal  Motor Activity Other (Comment) (WDL)  Appearance/Hygiene Unremarkable  Behavior Characteristics Cooperative  Mood Anxious;Depressed  Thought Process  Coherency WDL  Content WDL  Delusions None reported or observed  Perception WDL  Hallucination None reported or observed  Judgment Limited  Confusion None  Danger to Self  Current suicidal ideation? Passive  Self-Injurious Behavior No self-injurious ideation or behavior indicators observed or expressed   Agreement Not to Harm Self Yes  Description of Agreement Verbally contracts for safety.  Danger to Others  Danger to Others None reported or observed   Patient alert and oriented. Presenting appropriate to circumstance with an anxious, depressed mood. Patient denies HI, AVH, and pain. Patient is passive for SI and states that she has no plan or intent to act on thoughts while at [facility]. Patient endorses anxiety 4/10 and depression 7/10. Patient states she does not want prn medication for anxiety at this time, but states she will inform RN if anxiety increases. Patient reports muscle spasms in her ribs on the right side, prn tizanidine administered, per provider orders. Support and encouragement provided. Routine safety checks conducted every 15 minutes. Patient verbally contracts for safety and remains safe on the unit.

## 2022-04-14 NOTE — Progress Notes (Signed)
[facility] MD Progress Note  04/14/2022 10:28 AM Christina Hull  MRN:  [id number]  Reason for admission:Christina Hull is a 32 year old Caucasian female with past medical history of major depressive disorder, recurrent severe without psychosis, anxiety, and suicidal ideation who presents to [facility] from [facility] behavioral health urgent care for worsening depressive symptoms resulting in increasing thought of self harming behavior.   24-hour chart review: Patient is medication compliant.  Vital signs and lab reviewed within normal limits.  As needed medication requested within past 24 hours include tizanidine tablets 4 mg p.o. for muscle spasm yesterday at 2220.  Attending and actively participating in therapeutic milieu on the reports and policy of crisis stabilization.  Today's assessment notes: Patient seen and examined on [address].  Chart reviewed and findings shared with the treatment team and consult with attending psychiatrist.  Christina Hull presents alert, pleasant, oriented to self and situation.  Report mood is less depressed.  Rates depression as 6/10, with 10 being the worst.  Continues on the scheduled Cymbalta 40 mg p.o. daily.  Reiterated instructions on therapeutic effect, adverse effects and the need to be compliant with her medications.  Christina Hull reports understanding of instruction and denies any questions.  She rates anxiety as 3/10, with 10 being the worst.  Encouraged to asked the nursing staff for as needed medications for anxiety as needs arises.  She maintains a pleasant affect.  No delusional thinking or paranoia observed during examination.  Christina Hull did not appear to be responding to internal or external stimuli.   She reports back pain of 6/10 from her history of psoriatic arthritis.  Instructed to request as needed for back pain as needed from nursing staff.  She reports sleep of over 8 hours last night and being restful.  Christina Hull reports good  appetite and drinking adequate fluid for hydration.  She endorses passive SI, and reports that it is less constant compared to previous episode at admission.  Christina Hull denies HI or AVH.  Will continue current treatment plan as is in progress at this time.  Principal Problem: MDD (major depressive disorder), recurrent severe, without psychosis (HCC)  Diagnosis: Principal Problem:   MDD (major depressive disorder), recurrent severe, without psychosis (HCC)  Total Time spent with patient: 30 minutes  Past Psychiatric History: Previous Psych Diagnoses: Major depressive disorder, recurrent severe, without psychosis, anxiety, suicidal ideation Prior inpatient treatment: Patient denies [facility]  Patient started 04/05/2022 Prior rehab hx: Patient denies Psychotherapy hx: Patient denies History of suicide: Patient denies History of homicide or aggression: Denies Psychiatric medication history: Yes, see above Psychiatric medication compliance history: No, occasionally skip doses Neuromodulation history: Patient denies Current Psychiatrist: Yes, patient does not remember the name of the psychiatrist at the [facility] Current therapist: Yes, at the [facility]   Past Medical History:  Past Medical History:  Diagnosis Date   PCOS (polycystic ovarian syndrome)    History reviewed. No pertinent surgical history. Family History:  Family History  Problem Relation Age of Onset   Cancer Other    Heart disease Other    Depression Mother    Cancer Mother    COPD Mother    Arthritis Mother    Bipolar disorder Mother    Cancer Paternal Grandmother    Family Psychiatric  History: Psych: Mother has bipolar disorder and clinical depression.  Brother has clinical depression and social anxiety. Psych Rx: Yes SA/HA:  Mom and brother has history of suicide attempt.  No homicidal attempt Substance use family hx:  Sister has history of heroin abuse and died of the abuse.   Social History:  Social History   Substance and Sexual Activity  Alcohol Use None     Social History   Substance and Sexual Activity  Drug Use Not on file    Social History   Socioeconomic History   Marital status: Married    Spouse name: Not on file   Number of children: Not on file   Years of education: Not on file   Highest education level: Not on file  Occupational History   Not on file  Tobacco Use   Smoking status: Former    Types: Cigarettes    Passive exposure: Past   Smokeless tobacco: Never  Substance and Sexual Activity   Alcohol use: Not on file   Drug use: Not on file   Sexual activity: Not on file  Other Topics Concern   Not on file  Social History Narrative   ** Merged History Encounter **       Social Determinants of Health   Financial Resource Strain: Not on file  Food Insecurity: No Food Insecurity (04/13/2022)   Hunger Vital Sign    Worried About Running Out of Food in the Last Year: Never true    Ran Out of Food in the Last Year: Never true  Transportation Needs: No Transportation Needs (04/13/2022)   PRAPARE - Hydrologist (Medical): No    Lack of Transportation (Non-Medical): No  Physical Activity: Not on file  Stress: Not on file  Social Connections: Not on file   Additional Social History:     Sleep: Good  Appetite:  Good  Current Medications: Current Facility-Administered Medications  Medication Dose Route Frequency Provider Last Rate Last Admin   acetaminophen (TYLENOL) tablet 650 mg  650 mg Oral Q6H PRN Rankin, Shuvon B, NP       alum & mag hydroxide-simeth (MAALOX/MYLANTA) 200-200-20 MG/5ML suspension 30 mL  30 mL Oral Q4H PRN Rankin, Shuvon B, NP       DULoxetine (CYMBALTA) DR capsule 40 mg  40 mg Oral Daily Baker Moronta, Kris Hartmann, FNP   40 mg at 04/14/22 1324   hydrOXYzine (ATARAX) tablet 25 mg  25 mg Oral TID PRN Rankin, Shuvon B, NP        magnesium hydroxide (MILK OF MAGNESIA) suspension 30 mL  30 mL Oral Daily PRN Rankin, Shuvon B, NP       tiZANidine (ZANAFLEX) tablet 4 mg  4 mg Oral Q6H PRN Rankin, Shuvon B, NP   4 mg at 04/13/22 2220   traZODone (DESYREL) tablet 50 mg  50 mg Oral QHS PRN Rankin, Shuvon B, NP        Lab Results:  Results for orders placed or performed during the hospital encounter of 04/12/22 (from the past 48 hour(s))  Resp panel by RT-PCR (RSV, Flu A&B, Covid) Anterior Nasal Swab     Status: None   Collection Time: 04/12/22  3:43 PM   Specimen: Anterior Nasal Swab  Result Value Ref Range   SARS Coronavirus 2 by RT PCR NEGATIVE NEGATIVE    Comment: (NOTE) SARS-CoV-2 target nucleic acids are NOT DETECTED.  The SARS-CoV-2 RNA is generally detectable in upper respiratory specimens during the acute phase of infection. The lowest concentration of SARS-CoV-2 viral copies this assay can detect is 138 copies/mL. A negative result does  not preclude SARS-Cov-2 infection and should not be used as the sole basis for treatment or other patient management decisions. A negative result may occur with  improper specimen collection/handling, submission of specimen other than nasopharyngeal swab, presence of viral mutation(s) within the areas targeted by this assay, and inadequate number of viral copies(<138 copies/mL). A negative result must be combined with clinical observations, patient history, and epidemiological information. The expected result is Negative.  Fact Sheet for Patients:  EntrepreneurPulse.com.au  Fact Sheet for Healthcare Providers:  IncredibleEmployment.be  This test is no t yet approved or cleared by the [place] FDA and  has been authorized for detection and/or diagnosis of SARS-CoV-2 by FDA under an Emergency Use Authorization (EUA). This EUA will remain  in effect (meaning this test can be used) for the duration of the COVID-19 declaration under  Section 564(b)(1) of the Act, 21 U.S.C.section 360bbb-3(b)(1), unless the authorization is terminated  or revoked sooner.       Influenza A by PCR NEGATIVE NEGATIVE   Influenza B by PCR NEGATIVE NEGATIVE    Comment: (NOTE) The Xpert Xpress SARS-CoV-2/FLU/RSV plus assay is intended as an aid in the diagnosis of influenza from Nasopharyngeal swab specimens and should not be used as a sole basis for treatment. Nasal washings and aspirates are unacceptable for Xpert Xpress SARS-CoV-2/FLU/RSV testing.  Fact Sheet for Patients: EntrepreneurPulse.com.au  Fact Sheet for Healthcare Providers: IncredibleEmployment.be  This test is not yet approved or cleared by the [place] FDA and has been authorized for detection and/or diagnosis of SARS-CoV-2 by FDA under an Emergency Use Authorization (EUA). This EUA will remain in effect (meaning this test can be used) for the duration of the COVID-19 declaration under Section 564(b)(1) of the Act, 21 U.S.C. section 360bbb-3(b)(1), unless the authorization is terminated or revoked.     Resp Syncytial Virus by PCR NEGATIVE NEGATIVE    Comment: (NOTE) Fact Sheet for Patients: EntrepreneurPulse.com.au  Fact Sheet for Healthcare Providers: IncredibleEmployment.be  This test is not yet approved or cleared by the [place] FDA and has been authorized for detection and/or diagnosis of SARS-CoV-2 by FDA under an Emergency Use Authorization (EUA). This EUA will remain in effect (meaning this test can be used) for the duration of the COVID-19 declaration under Section 564(b)(1) of the Act, 21 U.S.C. section 360bbb-3(b)(1), unless the authorization is terminated or revoked.  Performed at [facility], [place] [address] [place], [place] [address]   POC SARS Coronavirus 2 Ag     Status: None   Collection Time: 04/12/22  4:05 PM  Result Value Ref Range    SARSCOV2ONAVIRUS 2 AG NEGATIVE NEGATIVE    Comment: (NOTE) SARS-CoV-2 antigen NOT DETECTED.   Negative results are presumptive.  Negative results do not preclude SARS-CoV-2 infection and should not be used as the sole basis for treatment or other patient management decisions, including infection  control decisions, particularly in the presence of clinical signs and  symptoms consistent with COVID-19, or in those who have been in contact with the virus.  Negative results must be combined with clinical observations, patient history, and epidemiological information. The expected result is Negative.  Fact Sheet for Patients: HandmadeRecipes.com.cy  Fact Sheet for Healthcare Providers: FuneralLife.at  This test is not yet approved or cleared by the [place] FDA and  has been authorized for detection and/or diagnosis of SARS-CoV-2 by FDA under an Emergency Use Authorization (EUA).  This EUA will remain in effect (meaning this test can be used)  for the duration of  the COV ID-19 declaration under Section 564(b)(1) of the Act, 21 U.S.C. section 360bbb-3(b)(1), unless the authorization is terminated or revoked sooner.    CBC with Differential/Platelet     Status: Abnormal   Collection Time: 04/12/22  4:12 PM  Result Value Ref Range   WBC 7.8 4.0 - 10.5 K/uL   RBC 4.82 3.87 - 5.11 MIL/uL   Hemoglobin 15.2 (H) 12.0 - 15.0 g/dL   HCT 42.5 36.0 - 46.0 %   MCV 88.2 80.0 - 100.0 fL   MCH 31.5 26.0 - 34.0 pg   MCHC 35.8 30.0 - 36.0 g/dL   RDW 12.0 11.5 - 15.5 %   Platelets 328 150 - 400 K/uL   nRBC 0.0 0.0 - 0.2 %   Neutrophils Relative % 76 %   Neutro Abs 5.9 1.7 - 7.7 K/uL   Lymphocytes Relative 19 %   Lymphs Abs 1.5 0.7 - 4.0 K/uL   Monocytes Relative 4 %   Monocytes Absolute 0.3 0.1 - 1.0 K/uL   Eosinophils Relative 0 %   Eosinophils Absolute 0.0 0.0 - 0.5 K/uL   Basophils Relative 1 %   Basophils Absolute 0.1 0.0 - 0.1 K/uL    Immature Granulocytes 0 %   Abs Immature Granulocytes 0.02 0.00 - 0.07 K/uL    Comment: Performed at [facility] [address], [place], [place] [address]  Comprehensive metabolic panel     Status: Abnormal   Collection Time: 04/12/22  4:12 PM  Result Value Ref Range   Sodium 138 135 - 145 mmol/L   Potassium 4.8 3.5 - 5.1 mmol/L   Chloride 100 98 - 111 mmol/L   CO2 27 22 - 32 mmol/L   Glucose, Bld 121 (H) 70 - 99 mg/dL    Comment: Glucose reference range applies only to samples taken after fasting for at least 8 hours.   BUN 15 6 - 20 mg/dL   Creatinine, Ser 1.13 (H) 0.44 - 1.00 mg/dL   Calcium 9.6 8.9 - 10.3 mg/dL   Total Protein 7.5 6.5 - 8.1 g/dL   Albumin 4.5 3.5 - 5.0 g/dL   AST 21 15 - 41 U/L   ALT 16 0 - 44 U/L   Alkaline Phosphatase 73 38 - 126 U/L   Total Bilirubin 0.4 0.3 - 1.2 mg/dL   GFR, Estimated >60 >60 mL/min    Comment: (NOTE) Calculated using the CKD-EPI Creatinine Equation (2021)    Anion gap 11 5 - 15    Comment: Performed at [facility] [address], [place], [place] [address]  Magnesium     Status: None   Collection Time: 04/12/22  4:12 PM  Result Value Ref Range   Magnesium 2.0 1.7 - 2.4 mg/dL    Comment: Performed at [facility] [address] [place], [place] [address]  Ethanol     Status: None   Collection Time: 04/12/22  4:12 PM  Result Value Ref Range   Alcohol, Ethyl (B) <10 <10 mg/dL    Comment: (NOTE) Lowest detectable limit for serum alcohol is 10 mg/dL.  For medical purposes only. Performed at [facility], [place] [address] [place], [place] [address]   Prolactin     Status: None   Collection Time: 04/12/22  4:12 PM  Result Value Ref Range   Prolactin 10.9 4.8 - 33.4 ng/mL    Comment: (NOTE) Performed At: [facility] [place],  [place] [id number] Jolene SchimkeNagendra Sanjai MD FA:[phone]   Lipid panel     Status: Abnormal   Collection Time: 04/12/22  4:12 PM  Result Value Ref Range    Cholesterol 198 0 - 200 mg/dL   Triglycerides 98 <696<150 mg/dL   HDL 70 >29>40 mg/dL   Total CHOL/HDL Ratio 2.8 RATIO   VLDL 20 0 - 40 mg/dL   LDL Cholesterol 528108 (H) 0 - 99 mg/dL    Comment:        Total Cholesterol/HDL:CHD Risk Coronary Heart Disease Risk Table                     Men   Women  1/2 Average Risk   3.4   3.3  Average Risk       5.0   4.4  2 X Average Risk   9.6   7.1  3 X Average Risk  23.4   11.0        Use the calculated Patient Ratio above and the CHD Risk Table to determine the patient's CHD Risk.        ATP III CLASSIFICATION (LDL):  <100     mg/dL   Optimal  413-244100-129  mg/dL   Near or Above                    Optimal  130-159  mg/dL   Borderline  010-272160-189  mg/dL   High  >536>190     mg/dL   Very High Performed at [facility], [address] [place], [place] [address]   TSH     Status: None   Collection Time: 04/12/22  4:12 PM  Result Value Ref Range   TSH 1.421 0.350 - 4.500 uIU/mL    Comment: Performed by a 3rd Generation assay with a functional sensitivity of <=0.01 uIU/mL. Performed at [facility], [address] [place], [place] [address]   Hemoglobin A1c     Status: None   Collection Time: 04/12/22  4:14 PM  Result Value Ref Range   Hgb A1c MFr Bld 4.8 4.8 - 5.6 %    Comment: (NOTE) Pre diabetes:          5.7%-6.4%  Diabetes:              >6.4%  Glycemic control for   <7.0% adults with diabetes    Mean Plasma Glucose 91.06 mg/dL    Comment: Performed at [facility], [address] [place], [place] [address]  Urinalysis, Routine w reflex microscopic -Urine, Clean Catch     Status: Abnormal   Collection Time: 04/12/22  5:24 PM  Result Value Ref Range   Color, Urine YELLOW YELLOW   APPearance CLEAR CLEAR   Specific Gravity, Urine 1.012 1.005 - 1.030   pH 7.0 5.0 - 8.0   Glucose, UA NEGATIVE NEGATIVE mg/dL   Hgb urine dipstick SMALL (A) NEGATIVE   Bilirubin Urine NEGATIVE NEGATIVE   Ketones, ur NEGATIVE NEGATIVE mg/dL    Protein, ur NEGATIVE NEGATIVE mg/dL   Nitrite NEGATIVE NEGATIVE   Leukocytes,Ua NEGATIVE NEGATIVE   RBC / HPF 0-5 0 - 5 RBC/hpf   WBC, UA 0-5 0 - 5 WBC/hpf   Bacteria, UA NONE SEEN NONE SEEN   Squamous Epithelial / HPF 0-5 0 - 5 /HPF    Comment: Performed at [facility], [address] [place], [place] [address]  POCT Urine Drug Screen - (I-Screen)  Status: Normal   Collection Time: 04/12/22  5:27 PM  Result Value Ref Range   POC Amphetamine UR None Detected NONE DETECTED (Cut Off Level 1000 ng/mL)   POC Secobarbital (BAR) None Detected NONE DETECTED (Cut Off Level 300 ng/mL)   POC Buprenorphine (BUP) None Detected NONE DETECTED (Cut Off Level 10 ng/mL)   POC Oxazepam (BZO) None Detected NONE DETECTED (Cut Off Level 300 ng/mL)   POC Cocaine UR None Detected NONE DETECTED (Cut Off Level 300 ng/mL)   POC Methamphetamine UR None Detected NONE DETECTED (Cut Off Level 1000 ng/mL)   POC Morphine None Detected NONE DETECTED (Cut Off Level 300 ng/mL)   POC Methadone UR None Detected NONE DETECTED (Cut Off Level 300 ng/mL)   POC Oxycodone UR None Detected NONE DETECTED (Cut Off Level 100 ng/mL)   POC Marijuana UR None Detected NONE DETECTED (Cut Off Level 50 ng/mL)  Pregnancy, urine POC     Status: None   Collection Time: 04/12/22  5:31 PM  Result Value Ref Range   Preg Test, Ur NEGATIVE NEGATIVE    Comment:        THE SENSITIVITY OF THIS METHODOLOGY IS >24 mIU/mL    Blood Alcohol level:  Lab Results  Component Value Date   ETH <10 04/12/2022   Metabolic Disorder Labs: Lab Results  Component Value Date   HGBA1C 4.8 04/12/2022   MPG 91.06 04/12/2022   Lab Results  Component Value Date   PROLACTIN 10.9 04/12/2022   Lab Results  Component Value Date   CHOL 198 04/12/2022   TRIG 98 04/12/2022   HDL 70 04/12/2022   CHOLHDL 2.8 04/12/2022   VLDL 20 04/12/2022   LDLCALC 108 (H) 04/12/2022   LDLCALC 87 02/08/2022    Physical Findings: AIMS:  , ,  ,  ,    CIWA:     COWS:     Musculoskeletal: Strength & Muscle Tone: within normal limits Gait & Station: normal Patient leans: N/A  Psychiatric Specialty Exam:  Presentation  General Appearance:  Appropriate for Environment; Casual; Fairly Groomed  Eye Contact: Good  Speech: Clear and Coherent  Speech Volume: Normal  Handedness: Right  Mood and Affect  Mood: Anxious; Depressed; Hopeless  Affect: Congruent  Thought Process  Thought Processes: Coherent  Descriptions of Associations:Intact  Orientation:Full (Time, Place and Person)  Thought Content:Logical  History of Schizophrenia/Schizoaffective disorder:No  Duration of Psychotic Symptoms:No data recorded Hallucinations:Hallucinations: None  Ideas of Reference:None  Suicidal Thoughts:Suicidal Thoughts: Yes, Passive SI Active Intent and/or Plan: -- (n/a) SI Passive Intent and/or Plan: Without Intent; Without Plan; Without Access to Means  Homicidal Thoughts:Homicidal Thoughts: No  Sensorium  Memory: Immediate Good; Recent Good  Judgment: Fair  Insight: Fair  Executive Functions  Concentration: Good  Attention Span: Good  Recall: Good  Fund of Knowledge: Good  Language: Good  Psychomotor Activity  Psychomotor Activity: Psychomotor Activity: Normal  Assets  Assets: Communication Skills; Physical Health; Desire for Improvement; Financial Resources/Insurance; Housing; Social Support  Sleep  Sleep: Sleep: Good Number of Hours of Sleep: 8  Physical Exam: Physical Exam Vitals and nursing note reviewed.  HENT:     Head: Normocephalic.     Nose: Nose normal.     Mouth/Throat:     Mouth: Mucous membranes are moist.     Pharynx: Oropharynx is clear.  Eyes:     Conjunctiva/sclera: Conjunctivae normal.     Pupils: Pupils are equal, round, and reactive to light.  Cardiovascular:  Rate and Rhythm: Normal rate.     Pulses: Normal pulses.  Pulmonary:     Effort: Pulmonary effort is  normal.  Abdominal:     Palpations: Abdomen is soft.  Genitourinary:    Comments: Deferred Musculoskeletal:        General: Normal range of motion.     Cervical back: Normal range of motion.  Skin:    General: Skin is warm.  Neurological:     General: No focal deficit present.     Mental Status: She is alert and oriented to person, place, and time.  Psychiatric:        Mood and Affect: Mood normal.        Behavior: Behavior normal.    Review of Systems  Constitutional: Negative.   HENT: Negative.    Eyes: Negative.   Respiratory: Negative.    Cardiovascular: Negative.   Gastrointestinal: Negative.   Genitourinary: Negative.   Musculoskeletal:  Positive for back pain.       History of psoriatic arthritis  Skin: Negative.   Neurological: Negative.   Psychiatric/Behavioral:  Positive for depression, substance abuse and suicidal ideas. The patient is nervous/anxious and has insomnia.    Blood pressure 111/85, pulse 94, temperature 98.3 F (36.8 C), temperature source Oral, resp. rate 16, SpO2 100 %. There is no height or weight on file to calculate BMI.  Treatment Plan Summary:  Daily contact with patient to assess and evaluate symptoms and progress in treatment, Medication management.  Observation Level/Precautions:  15 minute checks  Laboratory:  CBC Chemistry Profile HbAIC HCG UDS UA  Psychotherapy: Therapeutic milieu  Medications: See MAR  Consultations: Social work  Discharge Concerns: Safety  Estimated LOS: 5 to 7 days  Other:      Treatment Plan Summary: Daily contact with patient to assess and evaluate symptoms and progress in treatment and Medication management   Physician Treatment Plan for Primary Diagnosis: MDD (major depressive disorder), recurrent severe, without psychosis (HCC)    Long Term Goal(s): Improvement in symptoms so as ready for discharge   Short Term Goals: Ability to identify changes in lifestyle to reduce recurrence of condition will  improve, Ability to verbalize feelings will improve, Ability to disclose and discuss suicidal ideas, Ability to demonstrate self-control will improve, Ability to identify and develop effective coping behaviors will improve, Ability to maintain clinical measurements within normal limits will improve, Compliance with prescribed medications will improve, and Ability to identify triggers associated with substance abuse/mental health issues will improve   Physician Treatment Plan for Secondary Diagnosis: Principal Problem:   MDD (major depressive disorder), recurrent severe, without psychosis (HCC)   PLAN: MDD (major depressive disorder), recurrent severe, without psychosis --Initiate duloxetine (Cymbalta) DR capsule 40 mg p.o. daily -- Placed on hold at this time dextro meth upon-bupropion ER 45-105 mg T BCR 1 tablets p.o. 2 times daily   Anxiety -Initiate hydroxyzine 25 mg 3 times daily as needed/anxiety   Insomnia -Initiate trazodone 50 mg 1 tablet p.o. nightly as needed   Other PRN Medications -Acetaminophen 650 mg every 6 as needed/mild pain -Maalox 30 mL oral every 4 as needed/digestion -Magnesium hydroxide 30 mL daily as needed/mild constipation -- Tizanidine tablets 4 mg p.o. every 6 hours as needed muscle spasm   Safety and Monitoring: Voluntary admission to inpatient psychiatric unit for safety, stabilization and treatment Daily contact with patient to assess and evaluate symptoms and progress in treatment Patient's case to be discussed in multi-disciplinary team meeting Observation Level :  q15 minute checks Vital signs: q12 hours Precautions: suicide, but pt currently verbally contracts for safety on unit    Discharge Planning: Social work and case management to assist with discharge planning and identification of hospital follow-up needs prior to discharge Estimated LOS: 5-7 days Discharge Concerns: Need to establish a safety plan; Medication compliance and  effectiveness Discharge Goals: Return home with outpatient referrals for mental health follow-up including medication management/psychotherapy.   I certify that inpatient services furnished can reasonably be expected to improve the patient's condition.   Cecilie Lowers, FNP 04/14/2022, 10:28 AM

## 2022-04-14 NOTE — Progress Notes (Signed)
   04/14/22 0555  15 Minute Checks  Location Bedroom  Visual Appearance Calm  Behavior Sleeping  Sleep (Behavioral Health Patients Only)  Calculate sleep? (Click Yes once per 24 hr at 0600 safety check) Yes  Documented sleep last 24 hours 8

## 2022-04-14 NOTE — Group Note (Signed)
Date:  04/14/2022 Time:  10:27 AM  Group Topic/Focus:  Orientation:   The focus of this group is to educate the patient on the purpose and policies of crisis stabilization and provide a format to answer questions about their admission.  The group details unit policies and expectations of patients while admitted.    Participation Level:  Active  Participation Quality:  Appropriate  Affect:  Appropriate  Cognitive:  Appropriate  Insight: Appropriate  Engagement in Group:  Engaged  Modes of Intervention:  Discussion  Additional Comments:     Jerrye Beavers 04/14/2022, 10:27 AM

## 2022-04-15 DIAGNOSIS — F603 Borderline personality disorder: Secondary | ICD-10-CM | POA: Insufficient documentation

## 2022-04-15 DIAGNOSIS — F411 Generalized anxiety disorder: Secondary | ICD-10-CM | POA: Insufficient documentation

## 2022-04-15 MED ORDER — IBUPROFEN 400 MG PO TABS
400.0000 mg | ORAL_TABLET | Freq: Four times a day (QID) | ORAL | Status: DC | PRN
  Administered 2022-04-15: 400 mg via ORAL
  Filled 2022-04-15: qty 1

## 2022-04-15 MED ORDER — DULOXETINE HCL 20 MG PO CPEP
20.0000 mg | ORAL_CAPSULE | Freq: Every day | ORAL | Status: AC
  Administered 2022-04-15: 20 mg via ORAL
  Filled 2022-04-15 (×2): qty 1

## 2022-04-15 MED ORDER — DULOXETINE HCL 60 MG PO CPEP
60.0000 mg | ORAL_CAPSULE | Freq: Every day | ORAL | Status: DC
  Administered 2022-04-16 – 2022-04-17 (×2): 60 mg via ORAL
  Filled 2022-04-15 (×4): qty 1

## 2022-04-15 NOTE — Group Note (Signed)
Date:  04/15/2022 Time:  10:22 AM  Group Topic/Focus:  Orientation:   The focus of this group is to educate the patient on the purpose and policies of crisis stabilization and provide a format to answer questions about their admission.  The group details unit policies and expectations of patients while admitted.    Participation Level:  Active  Participation Quality:  Appropriate  Affect:  Appropriate  Cognitive:  Appropriate  Insight: Appropriate  Engagement in Group:  Engaged  Modes of Intervention:  Discussion  Additional Comments:     Jerrye Beavers 04/15/2022, 10:22 AM

## 2022-04-15 NOTE — Progress Notes (Signed)
   04/15/22 7619  15 Minute Checks  Location Bedroom  Visual Appearance Calm  Behavior Composed  Sleep (Behavioral Health Patients Only)  Calculate sleep? (Click Yes once per 24 hr at 0600 safety check) Yes  Documented sleep last 24 hours 9.5

## 2022-04-15 NOTE — Plan of Care (Signed)

## 2022-04-15 NOTE — [facility] Group Notes (Signed)
Patient attended the AA meeting this evening.  

## 2022-04-15 NOTE — Group Note (Signed)
LCSW Group Therapy Note  Group Date: 04/14/2022 Start Time: 1100 End Time: 1200   Type of Therapy and Topic:  Group Therapy - Healthy vs Unhealthy Coping Skills  Participation Level:  Active   Description of Group The focus of this group was to determine what unhealthy coping techniques typically are used by group members and what healthy coping techniques would be helpful in coping with various problems. Patients were guided in becoming aware of the differences between healthy and unhealthy coping techniques. Patients were asked to identify 2-3 healthy coping skills they would like to learn to use more effectively.  Therapeutic Goals Patients learned that coping is what human beings do all day long to deal with various situations in their lives Patients defined and discussed healthy vs unhealthy coping techniques Patients identified their preferred coping techniques and identified whether these were healthy or unhealthy Patients determined 2-3 healthy coping skills they would like to become more familiar with and use more often. Patients provided support and ideas to each other   Summary of Patient Progress:  During group, Christina Hull expressed . Patient proved open to input from peers and feedback from [place]. Patient demonstrated good insight into the subject matter, was respectful of peers, and participated throughout the entire session.   Therapeutic Modalities Cognitive Behavioral Therapy Motivational Interviewing  Windle Guard, LCSW 04/15/2022  10:35 AM

## 2022-04-15 NOTE — Progress Notes (Signed)
D: Patient alert and oriented, able to make needs known. Denies HI, AVH at present. Endorses passive SI with no distinct plan, but she does contract for safety on the unit. Denies pain at present. Patient goal today "using coping strategies to redirect my thoughts." Rates depression 6/10, hopelessness 5/10, and anxiety 2/10. Patient reports energy level as low. She reports she slept fair last night. Patient does not request any PRN medication at this time.   A: Scheduled medications administered to patient per MD order. Support and encouragement provided. Routine safety checks conducted every fifteen minutes. Patient informed to notify staff with problems or concerns. Frequent verbal contact made.   R: No adverse drug reactions noted. Patient contracts for safety at this time. Patient is compliant with medications and treatment plan. Patient receptive, calm and cooperative. Patient interacts with others appropriately on unit at present. Patient remains safe at present.

## 2022-04-15 NOTE — Group Note (Signed)
Recreation Therapy Group Note   Group Topic:Health and Wellness  Group Date: 04/15/2022 Start Time: 6283 End Time: 0958 Facilitators: Lavonne Cass-McCall, LRT,CTRS Location: [address] Dayroom   Goal Area(s) Addresses:  Patient will define components of whole wellness. Patient will verbalize benefit of whole wellness.  Group Description: Mental Gymnastics. LRT and patients discussed the components of wellness (mental, physical and spiritual). LRT and patients also discussed the importance of wellness and how it affects [place] on a daily basis. LRT then gave patients two worksheets of brain teasers. LRT explained to patients instead of doing physical exercise, they were going to exercise their brains and solve the brain teasers presented on the worksheets. Patients were given 20 minutes to decode as many of brain teasers they could before they went over them as a group     Affect/Mood: Appropriate   Participation Level: Engaged   Participation Quality: Independent   Behavior: Appropriate   Speech/Thought Process: Focused   Insight: Good   Judgement: Good   Modes of Intervention: Worksheet   Patient Response to Interventions:  Engaged   Education Outcome:  Acknowledges education and In group clarification offered    Clinical Observations/Individualized Feedback: Pt was attentive and engaged in activity.  Pt worked well with peers in figuring out the teasers.    Plan: Continue to engage patient in RT group sessions 2-3x/week.   Alverna Fawley-McCall, LRT,CTRS 04/15/2022 10:48 AM

## 2022-04-15 NOTE — Group Note (Signed)
Date:  04/15/2022 Time:  9:51 AM  Group Topic/Focus:  Orientation:   The focus of this group is to educate the patient on the purpose and policies of crisis stabilization and provide a format to answer questions about their admission.  The group details unit policies and expectations of patients while admitted.    Participation Level:  Active  Participation Quality:  Appropriate  Affect:  Appropriate  Cognitive:  Appropriate  Insight: Appropriate  Engagement in Group:  Engaged  Modes of Intervention:  Discussion  Additional Comments:     Jerrye Beavers 04/15/2022, 9:51 AM

## 2022-04-15 NOTE — Progress Notes (Signed)
[facility] MD Progress Note  04/15/2022 5:28 PM Christina Hull  MRN:  [id number]  Subjective:    Christina Hull is a 32 year old Caucasian female with past medical history of major depressive disorder, recurrent severe without psychosis, anxiety, and suicidal ideation who presents to [facility] from [facility] behavioral health urgent care for worsening depressive symptoms resulting in increasing thought of self harming behavior.    On assessment today, the patient reports that she still feels depressed but less so since admission.  She reports that her level of depression is decreased from a 9 to a 6, out of 10, and 10 were the worst.  She reports that suicidal thoughts continue, passive, off and on.  She reports suicidal thoughts are less intense and less frequent, since admission.  She reports that sleep is okay.  Reports appetite is okay.  Denies any side effects since the increase in Cymbalta, and is and is in agreement with increasing Cymbalta further to 60 mg once daily.    Principal Problem: MDD (major depressive disorder), recurrent severe, without psychosis ([place]) Diagnosis: Principal Problem:   MDD (major depressive disorder), recurrent severe, without psychosis ([place])  Total Time spent with patient: 15 minutes  Past Psychiatric History:  Previous Psych Diagnoses: Major depressive disorder, recurrent severe, without psychosis, anxiety, suicidal ideation Prior inpatient treatment: Patient denies Current/prior outpatient treatment: [facility], [facility]  Patient started 04/05/2022 Prior rehab hx: Patient denies Psychotherapy hx: Patient denies History of suicide: Patient denies History of homicide or aggression: Denies Psychiatric medication history: Yes, see above Psychiatric medication compliance history: No, occasionally skip doses Neuromodulation history: Patient denies Current Psychiatrist: Yes, patient does not remember the name of  the psychiatrist at the [facility] Current therapist: Yes, at the [facility]  Past Medical History:  Past Medical History:  Diagnosis Date   PCOS (polycystic ovarian syndrome)    History reviewed. No pertinent surgical history. Family History:  Family History  Problem Relation Age of Onset   Cancer Other    Heart disease Other    Depression Mother    Cancer Mother    COPD Mother    Arthritis Mother    Bipolar disorder Mother    Cancer Paternal Grandmother    Family Psychiatric  History:  Psych: Mother has bipolar disorder and clinical depression.  Brother has clinical depression and social anxiety. Psych Rx: Yes SA/HA: Mom and brother has history of suicide attempt.  No homicidal attempt Substance use family hx: Sister has history of heroin abuse and died of the abuse.   Social History:  Social History   Substance and Sexual Activity  Alcohol Use None     Social History   Substance and Sexual Activity  Drug Use Not on file    Social History   Socioeconomic History   Marital status: Married    Spouse name: Not on file   Number of children: Not on file   Years of education: Not on file   Highest education level: Not on file  Occupational History   Not on file  Tobacco Use   Smoking status: Former    Types: Cigarettes    Passive exposure: Past   Smokeless tobacco: Never  Substance and Sexual Activity   Alcohol use: Not on file   Drug use: Not on file   Sexual activity: Not on file  Other Topics Concern   Not on file  Social History Narrative   **  Merged History Encounter **       Social Determinants of Health   Financial Resource Strain: Not on file  Food Insecurity: No Food Insecurity (04/13/2022)   Hunger Vital Sign    Worried About Running Out of Food in the Last Year: Never true    Ran Out of Food in the Last Year: Never true  Transportation Needs: No Transportation Needs (04/13/2022)   PRAPARE - Armed forces logistics/support/administrative officer (Medical): No    Lack of Transportation (Non-Medical): No  Physical Activity: Not on file  Stress: Not on file  Social Connections: Not on file   Additional Social History:                         Sleep: Fair  Appetite:  Fair  Current Medications: Current Facility-Administered Medications  Medication Dose Route Frequency Provider Last Rate Last Admin   alum & mag hydroxide-simeth (MAALOX/MYLANTA) 200-200-20 MG/5ML suspension 30 mL  30 mL Oral Q4H PRN Rankin, Shuvon B, NP       [START ON 04/16/2022] DULoxetine (CYMBALTA) DR capsule 60 mg  60 mg Oral Daily Monetta Lick, MD       hydrOXYzine (ATARAX) tablet 25 mg  25 mg Oral TID PRN Rankin, Shuvon B, NP       ibuprofen (ADVIL) tablet 400 mg  400 mg Oral Q6H PRN Sharonne Ricketts, Ovid Curd, MD   400 mg at 04/15/22 1145   magnesium hydroxide (MILK OF MAGNESIA) suspension 30 mL  30 mL Oral Daily PRN Rankin, Shuvon B, NP       tiZANidine (ZANAFLEX) tablet 4 mg  4 mg Oral Q6H PRN Rankin, Shuvon B, NP   4 mg at 04/14/22 2051   traZODone (DESYREL) tablet 50 mg  50 mg Oral QHS PRN Rankin, Shuvon B, NP        Lab Results: No results found for this or any previous visit (from the past 48 hour(s)).  Blood Alcohol level:  Lab Results  Component Value Date   ETH <10 40/98/1191    Metabolic Disorder Labs: Lab Results  Component Value Date   HGBA1C 4.8 04/12/2022   MPG 91.06 04/12/2022   Lab Results  Component Value Date   PROLACTIN 10.9 04/12/2022   Lab Results  Component Value Date   CHOL 198 04/12/2022   TRIG 98 04/12/2022   HDL 70 04/12/2022   CHOLHDL 2.8 04/12/2022   VLDL 20 04/12/2022   LDLCALC 108 (H) 04/12/2022   LDLCALC 87 02/08/2022    Physical Findings: AIMS:  , ,  ,  ,    CIWA:    COWS:     Musculoskeletal: Strength & Muscle Tone: within normal limits Gait & Station: normal Patient leans: N/A  Psychiatric Specialty Exam:  Presentation  General Appearance:  Casual; Fairly  Groomed  Eye Contact: Good  Speech: Normal Rate  Speech Volume: Normal  Handedness: Right   Mood and Affect  Mood: Depressed; Anxious  Affect: Full Range   Thought Process  Thought Processes: Linear  Descriptions of Associations:Intact  Orientation:Full (Time, Place and Person)  Thought Content:Logical  History of Schizophrenia/Schizoaffective disorder:No  Duration of Psychotic Symptoms:No data recorded Hallucinations:Hallucinations: None  Ideas of Reference:None  Suicidal Thoughts:Suicidal Thoughts: Yes, Passive SI Active Intent and/or Plan: Without Intent; Without Plan SI Passive Intent and/or Plan: Without Intent; Without Plan; Without Access to Means  Homicidal Thoughts:Homicidal Thoughts: No   Sensorium  Memory: Immediate Good; Recent Good; Remote Good  Judgment: Fair  Insight: Fair   Community education officer  Concentration: Good  Attention Span: Good  Recall: Roel Cluck of Knowledge: Good  Language: Good   Psychomotor Activity  Psychomotor Activity: Psychomotor Activity: Normal   Assets  Assets: Communication Skills; Physical Health; Desire for Improvement; Financial Resources/Insurance; Housing; Social Support   Sleep  Sleep: Sleep: Fair Number of Hours of Sleep: 8    Physical Exam: Physical Exam Vitals reviewed.  Constitutional:      General: She is not in acute distress.    Appearance: She is not toxic-appearing.  Neurological:     Mental Status: She is alert.     Motor: No weakness.     Gait: Gait normal.    Review of Systems  Neurological:  Positive for headaches. Negative for dizziness, tingling and tremors.  Psychiatric/Behavioral:  Positive for depression and suicidal ideas. The patient is nervous/anxious.    Blood pressure 105/81, pulse 96, temperature 98.4 F (36.9 C), temperature source Oral, resp. rate 16, SpO2 99 %. There is no height or weight on file to calculate BMI.   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  Other PRN Medications -Acetaminophen 650 mg every 6 as needed/mild pain -Maalox 30 mL oral every 4 as needed/digestion -Magnesium hydroxide 30 mL daily as needed/mild constipation -- Tizanidine tablets 4 mg p.o. every 6 hours as needed muscle spasm   Safety and Monitoring: Voluntary admission to inpatient psychiatric unit for safety, stabilization and treatment Daily contact with patient to assess and evaluate symptoms and progress in treatment Patient's case to be discussed in multi-disciplinary team meeting Observation Level : q15 minute checks Vital signs: q12 hours Precautions: suicide, but pt currently verbally contracts for safety on unit    Discharge Planning: Social work and case management to assist with discharge planning and identification of hospital follow-up needs prior to discharge Estimated LOS: 2-3 days Discharge Concerns: Need to establish a safety plan; Medication compliance and effectiveness Discharge Goals: Return home with outpatient referrals for mental health follow-up including medication management/psychotherapy.     Christoper Allegra, MD 04/15/2022, 5:28 PM  Total Time Spent in Direct Patient Care:  I personally spent 35 minutes on the unit in direct patient care. The direct patient care time included face-to-face time with the patient, reviewing the patient's chart, communicating with other professionals, and coordinating care. Greater than 50% of this time was spent in counseling or coordinating care with the patient regarding goals of  hospitalization, psycho-education, and discharge planning needs.   Janine Limbo, MD Psychiatrist

## 2022-04-16 NOTE — Progress Notes (Signed)
[facility] MD Progress Note  04/16/2022 3:15 PM Christina Hull  MRN:  [id number]  Subjective:  Christina Hull is a 32 year old Caucasian female with past medical history of major depressive disorder, recurrent severe without psychosis, anxiety, and suicidal ideation who presents to [facility] from [facility] behavioral health urgent care for worsening depressive symptoms resulting in increasing thought of self harming behavior.    24 hour chart review: Vital signs WNL; PR 101 during the morning. Patient received first dose of Cymbalta 60 mg today; denies any side effects. No PRNs given overnight.   Assessment: Patient observed in milieu interacting with peers appropriately and attending unit groups and activities. She is casually dressed; appropriate attention to ADLs noted. Alert and oriented. Calm and cooperative. Thought processes logical and linease. Reports sleeping 'really good' and attending 'every group since I've been here'. Rates overall mood 8/10 with 10 being the best; anxiety 2/10, depression 4/10 with 10 being the worst. She reports having chronic depression and suicidal thoughts; feels they are 'really good today and have gotten quieter and less intense'. Feels increased socialization and not having access to cellphone has helped. Denies any side effects to medications. She endorses having some passive suicidal ideations over the past 24 hours that she reports feels 'it's normal for me I don't expect them to ever go away'. Denies any homicidal ideations, auditory/visual hallucinations.   Principal Problem: MDD (major depressive disorder), recurrent severe, without psychosis (Donna) Diagnosis: Principal Problem:   MDD (major depressive disorder), recurrent severe, without psychosis (Hawley) Active Problems:   Borderline personality disorder (Bells)   GAD (generalized anxiety disorder)  Total Time spent with patient: 15 minutes  Past Psychiatric History:   Previous Psych Diagnoses: Major depressive disorder, recurrent severe, without psychosis, anxiety, suicidal ideation Prior inpatient treatment: Patient denies [facility]  Patient started 04/05/2022 Prior rehab hx: Patient denies Psychotherapy hx: Patient denies History of suicide: Patient denies History of homicide or aggression: Denies Psychiatric medication history: Yes, see above Psychiatric medication compliance history: No, occasionally skip doses Neuromodulation history: Patient denies Current Psychiatrist: Yes, patient does not remember the name of the psychiatrist at the [facility] Current therapist: Yes, at the [facility]  Past Medical History:  Past Medical History:  Diagnosis Date   PCOS (polycystic ovarian syndrome)    History reviewed. No pertinent surgical history. Family History:  Family History  Problem Relation Age of Onset   Cancer Other    Heart disease Other    Depression Mother    Cancer Mother    COPD Mother    Arthritis Mother    Bipolar disorder Mother    Cancer Paternal Grandmother    Family Psychiatric  History:  Psych: Mother has bipolar disorder and clinical depression.  Brother has clinical depression and social anxiety. Psych Rx: Yes SA/HA: Mom and brother has history of suicide attempt.  No homicidal attempt Substance use family hx: Sister has history of heroin abuse and died of the abuse.   Social History:  Social History   Substance and Sexual Activity  Alcohol Use None     Social History   Substance and Sexual Activity  Drug Use Not on file    Social History   Socioeconomic History   Marital status: Married    Spouse name: Not on file   Number of children: Not on file   Years of education: Not on file  Highest education level: Not on file  Occupational History   Not on file  Tobacco Use   Smoking status: Former    Types:  Cigarettes    Passive exposure: Past   Smokeless tobacco: Never  Substance and Sexual Activity   Alcohol use: Not on file   Drug use: Not on file   Sexual activity: Not on file  Other Topics Concern   Not on file  Social History Narrative   ** Merged History Encounter **       Social Determinants of Health   Financial Resource Strain: Not on file  Food Insecurity: No Food Insecurity (04/13/2022)   Hunger Vital Sign    Worried About Running Out of Food in the Last Year: Never true    Ran Out of Food in the Last Year: Never true  Transportation Needs: No Transportation Needs (04/13/2022)   PRAPARE - Administrator, Civil Service (Medical): No    Lack of Transportation (Non-Medical): No  Physical Activity: Not on file  Stress: Not on file  Social Connections: Not on file   Additional Social History:   Sleep: Fair  Appetite:  Fair  Current Medications: Current Facility-Administered Medications  Medication Dose Route Frequency Provider Last Rate Last Admin   alum & mag hydroxide-simeth (MAALOX/MYLANTA) 200-200-20 MG/5ML suspension 30 mL  30 mL Oral Q4H PRN Rankin, Shuvon B, NP       DULoxetine (CYMBALTA) DR capsule 60 mg  60 mg Oral Daily Massengill, Harrold Donath, MD   60 mg at 04/16/22 9147   hydrOXYzine (ATARAX) tablet 25 mg  25 mg Oral TID PRN Rankin, Shuvon B, NP       ibuprofen (ADVIL) tablet 400 mg  400 mg Oral Q6H PRN Massengill, Harrold Donath, MD   400 mg at 04/15/22 1145   magnesium hydroxide (MILK OF MAGNESIA) suspension 30 mL  30 mL Oral Daily PRN Rankin, Shuvon B, NP       tiZANidine (ZANAFLEX) tablet 4 mg  4 mg Oral Q6H PRN Rankin, Shuvon B, NP   4 mg at 04/15/22 2120   traZODone (DESYREL) tablet 50 mg  50 mg Oral QHS PRN Rankin, Shuvon B, NP        Lab Results: No results found for this or any previous visit (from the past 48 hour(s)).  Blood Alcohol level:  Lab Results  Component Value Date   ETH <10 04/12/2022    Metabolic Disorder Labs: Lab Results   Component Value Date   HGBA1C 4.8 04/12/2022   MPG 91.06 04/12/2022   Lab Results  Component Value Date   PROLACTIN 10.9 04/12/2022   Lab Results  Component Value Date   CHOL 198 04/12/2022   TRIG 98 04/12/2022   HDL 70 04/12/2022   CHOLHDL 2.8 04/12/2022   VLDL 20 04/12/2022   LDLCALC 108 (H) 04/12/2022   LDLCALC 87 02/08/2022    Physical Findings: AIMS:  , ,  ,  ,    CIWA:    COWS:     Musculoskeletal: Strength & Muscle Tone: within normal limits Gait & Station: normal Patient leans: N/A  Psychiatric Specialty Exam:  Presentation  General Appearance:  Casual; Fairly Groomed  Eye Contact: Good  Speech: Clear and Coherent; Normal Rate  Speech Volume: Normal  Handedness: Right  Mood and Affect  Mood: Euthymic  Affect: Congruent  Thought Process  Thought Processes: Coherent  Descriptions of Associations:Intact  Orientation:Full (Time, Place and Person)  Thought Content:Logical  History of Schizophrenia/Schizoaffective disorder:No  Duration of Psychotic Symptoms:No data recorded Hallucinations:Hallucinations: None  Ideas of Reference:None  Suicidal Thoughts:Suicidal Thoughts: Yes, Passive SI Active Intent and/or Plan: Without Intent; Without Plan SI Passive Intent and/or Plan: Without Intent; Without Plan; Without Means to Carry Out; Without Access to Means  Homicidal Thoughts:Homicidal Thoughts: No  Sensorium  Memory: Immediate Good; Recent Good  Judgment: Fair  Insight: Fair  Community education officer  Concentration: Good  Attention Span: Good  Recall: Good  Fund of Knowledge: Good  Language: Good  Psychomotor Activity  Psychomotor Activity: Psychomotor Activity: Normal  Assets  Assets: Resilience; Social Support; Physical Health; Communication Skills; Financial Resources/Insurance; Housing; Intimacy  Sleep  Sleep: Sleep: Good  Physical Exam: Physical Exam Vitals reviewed.  Constitutional:      General:  She is not in acute distress.    Appearance: She is not toxic-appearing.  HENT:     Head: Normocephalic.     Nose: Nose normal.     Mouth/Throat:     Mouth: Mucous membranes are moist.     Pharynx: Oropharynx is clear.  Eyes:     Pupils: Pupils are equal, round, and reactive to light.  Cardiovascular:     Rate and Rhythm: Normal rate.  Pulmonary:     Effort: Pulmonary effort is normal.  Abdominal:     Palpations: Abdomen is soft.  Musculoskeletal:        General: Normal range of motion.     Cervical back: Normal range of motion.  Skin:    General: Skin is warm and dry.  Neurological:     Mental Status: She is alert and oriented to person, place, and time.     Motor: No weakness.     Gait: Gait normal.    Review of Systems  Neurological:  Positive for headaches. Negative for dizziness, tingling and tremors.  Psychiatric/Behavioral:  Positive for depression and suicidal ideas. The patient is nervous/anxious.   All other systems reviewed and are negative.  Blood pressure 116/80, pulse (!) 101, temperature 98.6 F (37 C), temperature source Oral, resp. rate 20, SpO2 100 %. There is no height or weight on file to calculate BMI.   Treatment Plan Summary: Daily contact with patient to assess and evaluate symptoms and progress in treatment and Medication management  Assessment: Borderline personality disorder MDD, severe recurrent without psychosis GAD  Plan -Continue:  -Cymbalta 60 mg once daily  - increased 04/15/22  -Possibly consider over the weekend, adding Abilify to augment antidepressant if necessary.  Other PRN Medications -Acetaminophen 650 mg every 6 as needed/mild pain -Maalox 30 mL oral every 4 as needed/digestion -Magnesium hydroxide 30 mL daily as needed/mild constipation -- Tizanidine tablets 4 mg p.o. every 6 hours as needed muscle spasm   Safety and Monitoring: Voluntary admission to inpatient psychiatric unit for safety, stabilization and  treatment Daily contact with patient to assess and evaluate symptoms and progress in treatment Patient's case to be discussed in multi-disciplinary team meeting Observation Level : q15 minute checks Vital signs: q12 hours Precautions: suicide, but pt currently verbally contracts for safety on unit    Discharge Planning: Social work and case management to assist with discharge planning and identification of hospital follow-up needs prior to discharge Estimated LOS: 2-3 days Discharge Concerns: Need to establish a safety plan; Medication compliance and effectiveness Discharge Goals: Return home with outpatient referrals for mental health follow-up including medication management/psychotherapy.    Inda Merlin, NP 04/16/2022, 3:15 PM   Patient ID: [name], female  DOB: [birth year], 32 y.o.   MRN: [id number]

## 2022-04-16 NOTE — Progress Notes (Signed)
   04/16/22 2232  Psych Admission Type (Psych Patients Only)  Admission Status Voluntary  Psychosocial Assessment  Patient Complaints Depression  Eye Contact Fair  Facial Expression Animated  Affect Appropriate to circumstance  Speech Logical/coherent  Interaction Assertive  Motor Activity Other (Comment) (WDL)  Appearance/Hygiene Unremarkable  Behavior Characteristics Appropriate to situation  Mood Depressed;Pleasant  Thought Process  Coherency WDL  Content WDL  Delusions None reported or observed  Perception WDL  Hallucination None reported or observed  Judgment Poor  Confusion None  Danger to Self  Current suicidal ideation? Denies  Self-Injurious Behavior No self-injurious ideation or behavior indicators observed or expressed   Agreement Not to Harm Self Yes  Description of Agreement verbal  Danger to Others  Danger to Others None reported or observed  Danger to Others Abnormal  Harmful Behavior to others No threats or harm toward other people  Destructive Behavior No threats or harm toward property

## 2022-04-16 NOTE — [facility] Group Notes (Signed)
Goals Group 04/16/22   Group Focus: affirmation, clarity of thought, and goals/reality orientation Treatment Modality:  Psychoeducation Interventions utilized were assignment, group exercise, and support Purpose: To be able to understand and verbalize the reason for their admission to the hospital. To understand that the medication helps with their chemical imbalance but they also need to work on their choices in life. To be challenged to develop a list of 30 positives about themselves. Also introduce the concept that "feelings" are not reality.  Participation Level:  Active  Participation Quality:  Appropriate  Affect:  Appropriate  Cognitive:  Appropriate  Insight:  Improving  Engagement in Group:  Engaged  Additional Comments:  Pt rates her energy at a 7/10.  [name]

## 2022-04-16 NOTE — Progress Notes (Signed)
   04/15/22 2100  Psych Admission Type (Psych Patients Only)  Admission Status Voluntary  Psychosocial Assessment  Patient Complaints None  Eye Contact Fair  Facial Expression Animated  Affect Appropriate to circumstance  Speech Logical/coherent  Interaction Assertive  Motor Activity Slow  Appearance/Hygiene Unremarkable  Behavior Characteristics Cooperative;Appropriate to situation;Calm  Mood Pleasant  Thought Process  Coherency WDL  Content WDL  Delusions None reported or observed  Perception WDL  Hallucination None reported or observed  Judgment Poor  Confusion None  Danger to Self  Current suicidal ideation? Denies  Self-Injurious Behavior No self-injurious ideation or behavior indicators observed or expressed   Agreement Not to Harm Self Yes  Description of Agreement verbal  Danger to Others  Danger to Others None reported or observed  Danger to Others Abnormal  Harmful Behavior to others No threats or harm toward other people  Destructive Behavior No threats or harm toward property

## 2022-04-16 NOTE — [facility] Group Notes (Signed)
Adult Psychoeducational Group Note  Date:  04/16/2022 Time:  8:24 PM  Group Topic/Focus:  Conflict Resolution:   The focus of this group is to discuss the conflict resolution process and how it may be used upon discharge.  Participation Level:  Active  Participation Quality:  Appropriate  Affect:  Appropriate  Cognitive:  Appropriate  Insight: Good  Engagement in Group:  Engaged  Modes of Intervention:  Discussion  Additional Comments  Dalene Carrow 04/16/2022, 8:24 PM

## 2022-04-16 NOTE — Progress Notes (Signed)
D: Patient is alert, oriented, pleasant, and cooperative. Patient endorsing passive SI with no plan. Denies HI, AVH, and verbally contracts for safety. Patient reports she slept good last night without sleeping medication. Patient reports her appetite as good, energy level as normal, and concentration as good. Patient rates her depression 4/10, hopelessness 4/10, and anxiety 2/10. Patient states "suicidal thoughts are less frequent and much less intense".    A: Scheduled medications administered per MD order. Support provided. Patient educated on safety on the unit and medications. Routine safety checks every 15 minutes. Patient stated understanding to tell nurse about any new physical symptoms. Patient understands to tell staff of any needs.     R: No adverse drug reactions noted. Patient verbally contracts for safety. Patient remains safe at this time and will continue to monitor.    04/16/22 0900  Psych Admission Type (Psych Patients Only)  Admission Status Voluntary  Psychosocial Assessment  Patient Complaints Depression;Anxiety  Eye Contact Fair  Facial Expression Animated  Affect Appropriate to circumstance  Speech Logical/coherent  Interaction Assertive  Motor Activity Slow  Appearance/Hygiene Unremarkable  Behavior Characteristics Cooperative;Appropriate to situation  Mood Anxious;Depressed  Thought Process  Coherency WDL  Content WDL  Delusions None reported or observed  Perception WDL  Hallucination None reported or observed  Judgment Poor  Confusion None  Danger to Self  Current suicidal ideation? Passive  Description of Suicide Plan no plan  Agreement Not to Harm Self Yes  Description of Agreement verbal  Danger to Others  Danger to Others None reported or observed  Danger to Others Abnormal  Harmful Behavior to others No threats or harm toward other people  Destructive Behavior No threats or harm toward property

## 2022-04-16 NOTE — [facility] Group Notes (Signed)
.  Psychoeducational Group Note    Date:  04/16/2022 Time:1300-1400    Purpose of Group: . The group focus' on teaching patients on how to identify their needs and their Life Skills:  A group where two lists are made. What people need and what are things that we do that are unhealthy. The lists are developed by the patients and it is explained that we often do the actions that are not healthy to get our list of needs met.  Goal:: to develop the coping skills needed to get their needs met  Participation Level:  Active  Participation Quality:  Appropriate  Affect:  Appropriate  Cognitive:  Oriented  Insight: Improving  Engagement in Group:  Engaged  Modes of Intervention:  Activity, Discussion, Education, and Support  Additional Comments:  Rates her energy at an 34/10  [place], [place]

## 2022-04-17 DIAGNOSIS — F332 Major depressive disorder, recurrent severe without psychotic features: Principal | ICD-10-CM

## 2022-04-17 MED ORDER — HYDROXYZINE HCL 25 MG PO TABS: 25.0000 mg | ORAL_TABLET | Freq: Three times a day (TID) | ORAL | 0 refills | Status: DC | PRN

## 2022-04-17 MED ORDER — TRAZODONE HCL 50 MG PO TABS: 50.0000 mg | ORAL_TABLET | Freq: Every evening | ORAL | 0 refills | Status: DC | PRN

## 2022-04-17 MED ORDER — DULOXETINE HCL 60 MG PO CPEP: 60.0000 mg | ORAL_CAPSULE | Freq: Every day | ORAL | 0 refills | Status: DC

## 2022-04-17 NOTE — [facility] Group Notes (Signed)
[facility] Group Notes:  (Nursing/MHT/Case Management/Adjunct)  Date:  04/17/2022  Time:  9:21 AM  Type of Therapy:   Goals Group  Participation Level:  Active  Participation Quality:  Appropriate  Affect:  Appropriate  Cognitive:  Appropriate  Insight:  Appropriate  Engagement in Group:  Engaged  Modes of Intervention:  Orientation  Summary of Progress/Problems:  Chase Picket 04/17/2022, 9:21 AM

## 2022-04-17 NOTE — [facility] Suicide Risk Assessment (Signed)
Suicide Risk Assessment  Discharge Assessment    [facility] Discharge Suicide Risk Assessment   Principal Problem: MDD (major depressive disorder), recurrent severe, without psychosis ([place]) Discharge Diagnoses: Principal Problem:   MDD (major depressive disorder), recurrent severe, without psychosis ([place]) Active Problems:   Borderline personality disorder ([place])   GAD (generalized anxiety disorder)   Christina Hull is a 32 year old Caucasian female with past medical history of major depressive disorder, recurrent severe without psychosis, anxiety, and suicidal ideation who presents to [facility] from [facility] behavioral health urgent care for worsening depressive symptoms resulting in increasing thought of self harming behavior.       Christina Hull was admitted for MDD (major depressive disorder), recurrent severe, without psychosis ([place]) and crisis management.  She was treated with the following medications see MAR.  Christina Hull was discharged with current medication and was instructed on how to take medications as prescribed; (details listed below under Medication List).  Medical problems were identified and treated as needed.  Home medications were restarted as appropriate.  Improvement was monitored by observation and Christina Sorrel Jorgensen daily report of symptom reduction.  Emotional and mental status was monitored by daily self-inventory reports completed by Christina Hull and clinical staff.         Christina Hull was evaluated by the treatment team for stability and plans for continued recovery upon discharge.  Christina Hull motivation was an integral factor for scheduling further treatment.  Employment, transportation, bed availability, health status, family support, and any pending legal issues were also considered during her hospital stay. She was offered further treatment options upon discharge including but not limited to Residential, Intensive  Outpatient, and Outpatient treatment.  Christina Hull will follow up with the services as listed below under Follow Up Information.     Upon completion of this admission the Christina Hull was both mentally and medically stable for discharge denying suicidal/homicidal ideation, auditory/visual/tactile hallucinations, delusional thoughts and paranoia.     Total Time spent with patient: 45 minutes  Musculoskeletal: Strength & Muscle Tone: within normal limits Gait & Station: normal Patient leans: N/A  Psychiatric Specialty Exam  Presentation  General Appearance:  Appropriate for Environment; Fairly Groomed  Eye Contact: Good  Speech: Clear and Coherent  Speech Volume: Normal  Handedness: Right   Mood and Affect  Mood: Euthymic  Duration of Depression Symptoms: Greater than two weeks  Affect: Congruent; Appropriate   Thought Process  Thought Processes: Coherent  Descriptions of Associations:Intact  Orientation:Full (Time, Place and Person)  Thought Content:Logical  History of Schizophrenia/Schizoaffective disorder:No  Duration of Psychotic Symptoms:No data recorded Hallucinations:Hallucinations: None  Ideas of Reference:None  Suicidal Thoughts:Suicidal Thoughts: Yes, Passive SI Passive Intent and/or Plan: Without Intent; Without Plan; Without Means to Carry Out; Without Access to Means  Homicidal Thoughts:Homicidal Thoughts: No   Sensorium  Memory: Immediate Good; Recent Good; Remote Good  Judgment: Good  Insight: Good   Executive Functions  Concentration: Good  Attention Span: Good  Recall: Good  Fund of Knowledge: Good  Language: Good   Psychomotor Activity  Psychomotor Activity: Psychomotor Activity: Normal   Assets  Assets: Physical Health; Resilience; Housing; Catering manager; Desire for Improvement; Communication Skills; Social Support; Vocational/Educational   Sleep  Sleep: Sleep:  Good   Physical Exam: Physical Exam Vitals and nursing note reviewed.  Constitutional:      Appearance: She is not ill-appearing.  HENT:     Head: Normocephalic.  Nose: Nose normal.     Mouth/Throat:     Mouth: Mucous membranes are moist.     Pharynx: Oropharynx is clear.  Eyes:     Pupils: Pupils are equal, round, and reactive to light.  Cardiovascular:     Rate and Rhythm: Normal rate.     Pulses: Normal pulses.  Pulmonary:     Effort: Pulmonary effort is normal.  Abdominal:     Palpations: Abdomen is soft.  Musculoskeletal:        General: Normal range of motion.     Cervical back: Normal range of motion.  Skin:    General: Skin is warm and dry.  Neurological:     Mental Status: She is alert and oriented to person, place, and time.  Psychiatric:        Attention and Perception: Attention and perception normal.        Mood and Affect: Mood and affect normal.        Speech: Speech normal.        Behavior: Behavior normal. Behavior is cooperative.        Thought Content: Thought content normal. Thought content is not paranoid or delusional. Thought content does not include homicidal or suicidal ideation. Thought content does not include homicidal or suicidal plan.        Cognition and Memory: Cognition and memory normal.        Judgment: Judgment normal.    Review of Systems  Psychiatric/Behavioral:  Negative for depression.   All other systems reviewed and are negative.  Blood pressure 104/73, pulse 96, temperature 99.3 F (37.4 C), temperature source Oral, resp. rate 16, SpO2 98 %. There is no height or weight on file to calculate BMI.  Mental Status Per Nursing Assessment::   On Admission:  Suicidal ideation indicated by patient, Suicide plan, Self-harm thoughts, Belief that plan would result in death  Demographic Factors:  Adolescent or young adult and Caucasian  Loss Factors: NA  Historical Factors: Prior suicide attempts, Family history of mental  illness or substance abuse, and Impulsivity  Risk Reduction Factors:   Responsible for children under 40 years of age, Sense of responsibility to family, Living with another person, especially a relative, Positive social support, Positive therapeutic relationship, and Positive coping skills or problem solving skills  Continued Clinical Symptoms:  Depression:   Recent sense of peace/wellbeing More than one psychiatric diagnosis Previous Psychiatric Diagnoses and Treatments  Cognitive Features That Contribute To Risk:  None    Suicide Risk:  Moderate:  Frequent suicidal ideation with limited intensity, and duration, some specificity in terms of plans, no associated intent, good self-control, limited dysphoria/symptomatology, some risk factors present, and identifiable protective factors, including available and accessible social support.   Follow-up [facility] Follow up on 04/19/2022.   Why: You have an appointment for medication management services on 04/19/22 at 11:00 am. (Virtual).  You also have an appointment for therapy services on 04/22/22 at 2:00 pm, (In Person) . Contact information: [address], [place] [address]  P: [phone] F: [phone]                Plan Of Care/Follow-up recommendations:  Activity: As tolerated  Diet: Heart healthy  Other: -Follow-up with your outpatient psychiatric provider -instructions on appointment date, time, and address (location) are provided to you in discharge paperwork.   -Take your psychiatric medications as prescribed at discharge - instructions  are provided to you in the discharge paperwork.    -Follow-up with outpatient primary care doctor and other specialists -for management of preventative medicine and chronic medical disease, including:   -Testing: Follow-up with outpatient provider for abnormal lab results:  none   -Recommend abstinence from alcohol, tobacco,  and other illicit drug use at discharge.    -If your psychiatric symptoms recur, worsen, or if you have side effects to your psychiatric medications, call your outpatient psychiatric provider, 911, [phone] or go to the nearest emergency department.   -If suicidal thoughts recur, call your outpatient psychiatric provider, 911, [phone] or go to the nearest emergency department.  Inda Merlin, NP 04/17/2022, 11:11 AM

## 2022-04-17 NOTE — Progress Notes (Signed)
Patient verbalizes readiness for discharge. All patient belongings returned to patient. Discharge instructions read and discussed with patient (appointments, medications, resources). Patient expressed gratitude for care provided. Patient discharged to lobby at 30 where husband was waiting.

## 2022-04-17 NOTE — Progress Notes (Signed)
  [facility] Adult Case Management Discharge Plan :  Will you be returning to the same living situation after discharge:  Yes,  with husband At discharge, do you have transportation home?: Yes,  with husband Do you have the ability to pay for your medications: Yes,  insurance  Release of information consent forms completed and emailed to Medical Records, then turned in to Medical Records by CSW.   Patient to Follow up at:  Follow-up [place] [facility] Follow up on 04/19/2022.   Why: You have an appointment for medication management services on 04/19/22 at 11:00 am. (Virtual).  You also have an appointment for therapy services on 04/22/22 at 2:00 pm, (In Person) . Contact information: [address], [place] [address]  P: [phone] F: [phone]                Next level of care provider has access to [place] and Suicide Prevention discussed: Yes,  with husband     Has patient been referred to the Quitline?: N/A patient is not a smoker  Patient has been referred for addiction treatment: Yes  Maretta Los, LCSW 04/17/2022, 11:02 AM

## 2022-04-17 NOTE — [facility] Group Notes (Signed)
Date:  04/17/2022 Time:  0900-1000 Group Topic/Focus: PROGRESSIVE RELAXATION. A group where deep breathing is taught and tensing and relaxation muscle groups is used. Imagery is used as well.  Pts are asked to imagine 3 pillars that hold them up when they are not able to hold themselves up and to share that with the group.   Participation Level:  Active  Participation Quality:  Appropriate  Affect:  Appropriate  Cognitive:  Approprate  Insight: Improving  Engagement in Group:  Engaged  Modes of Intervention:  deep breathing, Imagery. Discussion  Additional Comments:  Pt rates her energy at an 8/10. States her family, her husband and her friend hold her up.   : Christina Hull

## 2022-04-17 NOTE — Discharge Summary (Signed)
Physician Discharge Summary Note  Patient:  Christina Hull is an 32 y.o., female MRN:  [id number] DOB:  04-09-1990 Patient phone:  [phone] (home)  Patient address:   [address] [place] [address],  Total Time spent with patient: 45 minutes  Date of Admission:  04/12/2022 Date of Discharge: 04/17/22  Reason for Admission:   Christina Hull is a 32 year old Caucasian female with past medical history of major depressive disorder, recurrent severe without psychosis, anxiety, and suicidal ideation who presents to [facility] from [facility] behavioral health urgent care for worsening depressive symptoms resulting in increasing thought of self harming behavior.   Patient observed in milieu interacting with peers appropriately and attending unit groups and activities. She is casually dressed with appropriate attention to ADLs noted. She has presented alert and oriented. Calm and cooperative. Thought processes logical and linear. She endorses improved sleep and mood. She reports having chronic depression and suicidal thoughts. Reports improvement since admission. She has remained medication compliant; denies any side effects to medications. She endorses having some passive suicidal ideations at baseline denies any intent or plan for completion. Provider discussed at length risk factors where patient reports strong support system including husband who resides in the home. She contracts for safety and denies any safety concerns for discharge. Identifies family particularly children as protective factors. Denies any homicidal ideations, auditory/visual hallucinations. Discussed safety plan that husband has purchased lock box for medications that he plans on managing. She provided verbal permission to speak with husband for collateral information and discharge planning.   CollateralElverna Hull [phone] Reports plan to begin locking away  medications. Hasn't bought one yet, plan on buying one today. Reports ongoing contact with wife and feels she is doing better. Denies any safety concerns at this time. Feels patient is at baseline and endorses patient does have chronic suicidal ideations.  Principal Problem: MDD (major depressive disorder), recurrent severe, without psychosis (HCC) Discharge Diagnoses: Principal Problem:   MDD (major depressive disorder), recurrent severe, without psychosis (HCC) Active Problems:   Borderline personality disorder (HCC)   GAD (generalized anxiety disorder)  Past Psychiatric History: Major depressive disorder, recurrent severe, without psychosis, anxiety, suicidal ideation; [facility] admissions (multiple); [facility] Patient started 04/05/2022   Past Medical History:  Past Medical History:  Diagnosis Date   PCOS (polycystic ovarian syndrome)    History reviewed. No pertinent surgical history. Family History:  Family History  Problem Relation Age of Onset   Cancer Other    Heart disease Other    Depression Mother    Cancer Mother    COPD Mother    Arthritis Mother    Bipolar disorder Mother    Cancer Paternal Grandmother    Family Psychiatric History: Mother has bipolar disorder and clinical depression. Brother has clinical depression and social anxiety. Mom and brother has history of suicide attempt. No homicidal attempt Sister has history of heroin abuse and died of the abuse.   Social History:  Social History   Substance and Sexual Activity  Alcohol Use None     Social History   Substance and Sexual Activity  Drug Use Not on file    Social History   Socioeconomic History   Marital status: Married    Spouse name: Not on file   Number of children: Not on file   Years of education: Not on file   Highest education level: Not on file  Occupational History   Not on  file  Tobacco Use   Smoking status: Former    Types: Cigarettes    Passive exposure: Past    Smokeless tobacco: Never  Substance and Sexual Activity   Alcohol use: Not on file   Drug use: Not on file   Sexual activity: Not on file  Other Topics Concern   Not on file  Social History Narrative   ** Merged History Encounter **       Social Determinants of Health   Financial Resource Strain: Not on file  Food Insecurity: No Food Insecurity (04/13/2022)   Hunger Vital Sign    Worried About Running Out of Food in the Last Year: Never true    Ran Out of Food in the Last Year: Never true  Transportation Needs: No Transportation Needs (04/13/2022)   PRAPARE - Administrator, Civil Service (Medical): No    Lack of Transportation (Non-Medical): No  Physical Activity: Not on file  Stress: Not on file  Social Connections: Not on file   HOSPITAL COURSE:  During the patient's hospitalization, patient had extensive initial psychiatric evaluation, and follow-up psychiatric evaluations every day.  Psychiatric diagnoses provided upon initial assessment: MDD recurrent severe, without psychosis, Borderline Personality Disorder, GAD  Patient's psychiatric medications were adjusted on admission: See below  During the hospitalization, other adjustments were made to the patient's psychiatric medication regimen: Cymbalta 60 mg  Patient's care was discussed during the interdisciplinary team meeting every day during the hospitalization.  The patient denies having side effects to prescribed psychiatric medication.  Gradually, patient started adjusting to milieu. The patient was evaluated each day by a clinical provider to ascertain response to treatment. Improvement was noted by the patient's report of decreasing symptoms, improved sleep and appetite, affect, medication tolerance, behavior, and participation in unit programming.  Patient was asked each day to complete a self inventory noting mood, mental status, pain, new symptoms, anxiety and concerns.    Symptoms were reported as  significantly decreased or resolved completely by discharge.   On day of discharge, the patient reports that their mood is stable. The patient denied having suicidal thoughts for more than 48 hours prior to discharge.  Patient denies having homicidal thoughts.  Patient denies having auditory hallucinations.  Patient denies any visual hallucinations or other symptoms of psychosis. The patient was motivated to continue taking medication with a goal of continued improvement in mental health.   The patient reports their target psychiatric symptoms of depression responded well to the psychiatric medications, and the patient reports overall benefit other psychiatric hospitalization. Supportive psychotherapy was provided to the patient. The patient also participated in regular group therapy while hospitalized. Coping skills, problem solving as well as relaxation therapies were also part of the unit programming.  Labs were reviewed with the patient, and abnormal results were discussed with the patient.  The patient is able to verbalize their individual safety plan to this provider.  # It is recommended to the patient to continue psychiatric medications as prescribed, after discharge from the hospital.    # It is recommended to the patient to follow up with your outpatient psychiatric provider and PCP.  # It was discussed with the patient, the impact of alcohol, drugs, tobacco have been there overall psychiatric and medical wellbeing, and total abstinence from substance use was recommended the patient.ed.  # Prescriptions provided or sent directly to preferred pharmacy at discharge. Patient agreeable to plan. Given opportunity to ask questions. Appears to feel comfortable with discharge.    #  In the event of worsening symptoms, the patient is instructed to call the crisis hotline, 911 and or go to the nearest ED for appropriate evaluation and treatment of symptoms. To follow-up with primary care provider  for other medical issues, concerns and or health care needs  # Patient was discharged home with a plan to follow up as noted below.   Physical Findings: AIMS:  , ,  ,  ,    CIWA:    COWS:     Musculoskeletal: Strength & Muscle Tone: within normal limits Gait & Station: normal Patient leans: N/A   Psychiatric Specialty Exam:  Presentation  General Appearance:  Appropriate for Environment; Fairly Groomed  Eye Contact: Good  Speech: Clear and Coherent  Speech Volume: Normal  Handedness: Right   Mood and Affect  Mood: Euthymic  Affect: Congruent; Appropriate   Thought Process  Thought Processes: Coherent  Descriptions of Associations:Intact  Orientation:Full (Time, Place and Person)  Thought Content:Logical  History of Schizophrenia/Schizoaffective disorder:No  Duration of Psychotic Symptoms:No data recorded Hallucinations:Hallucinations: None  Ideas of Reference:None  Suicidal Thoughts:Suicidal Thoughts: Yes, Passive SI Passive Intent and/or Plan: Without Intent; Without Plan; Without Means to Carry Out; Without Access to Means  Homicidal Thoughts:Homicidal Thoughts: No   Sensorium  Memory: Immediate Good; Recent Good; Remote Good  Judgment: Good  Insight: Good   Executive Functions  Concentration: Good  Attention Span: Good  Recall: Good  Fund of Knowledge: Good  Language: Good   Psychomotor Activity  Psychomotor Activity: Psychomotor Activity: Normal   Assets  Assets: Physical Health; Resilience; Housing; Catering manager; Desire for Improvement; Communication Skills; Social Support; Vocational/Educational   Sleep  Sleep: Sleep: Good    Physical Exam: Physical Exam Vitals and nursing note reviewed.  Constitutional:      Appearance: She is not ill-appearing.  HENT:     Head: Normocephalic.     Nose: Nose normal.     Mouth/Throat:     Mouth: Mucous membranes are moist.     Pharynx:  Oropharynx is clear.  Eyes:     Pupils: Pupils are equal, round, and reactive to light.  Cardiovascular:     Rate and Rhythm: Normal rate.     Pulses: Normal pulses.  Pulmonary:     Effort: Pulmonary effort is normal.  Abdominal:     Palpations: Abdomen is soft.  Musculoskeletal:        General: Normal range of motion.     Cervical back: Normal range of motion.  Skin:    General: Skin is warm and dry.  Neurological:     Mental Status: She is alert and oriented to person, place, and time.  Psychiatric:        Attention and Perception: Attention and perception normal.        Mood and Affect: Mood and affect normal.        Speech: Speech normal.        Behavior: Behavior normal. Behavior is cooperative.        Thought Content: Thought content is not paranoid or delusional. Thought content does not include homicidal or suicidal ideation. Thought content does not include homicidal or suicidal plan.        Cognition and Memory: Cognition and memory normal.        Judgment: Judgment normal.    Review of Systems  Psychiatric/Behavioral:  Negative for depression. The patient does not have insomnia.   All other systems reviewed and are negative.  Blood pressure 104/73, pulse  96, temperature 99.3 F (37.4 C), temperature source Oral, resp. rate 16, SpO2 98 %. There is no height or weight on file to calculate BMI.   Social History   Tobacco Use  Smoking Status Former   Types: Cigarettes   Passive exposure: Past  Smokeless Tobacco Never   Tobacco Cessation:  N/A, patient does not currently use tobacco products   Blood Alcohol level:  Lab Results  Component Value Date   ETH <10 83/29/1916    Metabolic Disorder Labs:  Lab Results  Component Value Date   HGBA1C 4.8 04/12/2022   MPG 91.06 04/12/2022   Lab Results  Component Value Date   PROLACTIN 10.9 04/12/2022   Lab Results  Component Value Date   CHOL 198 04/12/2022   TRIG 98 04/12/2022   HDL 70 04/12/2022    CHOLHDL 2.8 04/12/2022   VLDL 20 04/12/2022   LDLCALC 108 (H) 04/12/2022   LDLCALC 87 02/08/2022    See Psychiatric Specialty Exam and Suicide Risk Assessment completed by Attending Physician prior to discharge.  Discharge destination:  Home  Is patient on multiple antipsychotic therapies at discharge:  No   Has Patient had three or more failed trials of antipsychotic monotherapy by history:  No  Recommended Plan for Multiple Antipsychotic Therapies: NA  Discharge Instructions     Diet - low sodium heart healthy   Complete by: As directed    Increase activity slowly   Complete by: As directed       Allergies as of 04/17/2022   No Known Allergies      Medication List     STOP taking these medications    Auvelity 45-105 MG Tbcr Generic drug: Dextromethorphan-buPROPion ER   ibuprofen 200 MG tablet Commonly known as: ADVIL   tiZANidine 4 MG tablet Commonly known as: ZANAFLEX       TAKE these medications      Indication  DULoxetine 60 MG capsule Commonly known as: CYMBALTA Take 1 capsule (60 mg total) by mouth daily. Start taking on: April 18, 2022 What changed:  medication strength how much to take  Indication: Major Depressive Disorder   Humira (2 Pen) 40 MG/0.4ML Pnkt Generic drug: Adalimumab Inject 40 mg into the skin every 14 (fourteen) days.  Indication: Plaque Psoriasis   hydrOXYzine 25 MG tablet Commonly known as: ATARAX Take 1 tablet (25 mg total) by mouth 3 (three) times daily as needed for anxiety.  Indication: Feeling Anxious   traZODone 50 MG tablet Commonly known as: DESYREL Take 1 tablet (50 mg total) by mouth at bedtime as needed for sleep.  Indication: [facility] Follow up on 04/19/2022.   Why: You have an appointment for medication management services on 04/19/22 at 11:00 am. (Virtual).  You also have an appointment for therapy services on 04/22/22 at 2:00 pm, (In  Person) . Contact information: [address], [place] [address]  P: [phone] F: [phone]                Follow-up recommendations:   Activity: As tolerated  Diet: Heart healthy  Other: -Follow-up with your outpatient psychiatric provider -instructions on appointment date, time, and address (location) are provided to you in discharge paperwork.   -Take your psychiatric medications as prescribed at discharge - instructions are provided to you in the discharge paperwork.    -Follow-up with outpatient primary care  doctor and other specialists -for management of preventative medicine and chronic medical disease, including:   -Testing: Follow-up with outpatient provider for abnormal lab results:  none   -Recommend abstinence from alcohol, tobacco, and other illicit drug use at discharge.    -If your psychiatric symptoms recur, worsen, or if you have side effects to your psychiatric medications, call your outpatient psychiatric provider, 911, [phone] or go to the nearest emergency department.   -If suicidal thoughts recur, call your outpatient psychiatric provider, 911, [phone] or go to the nearest emergency department.  Comments:    Signed: Inda Merlin, NP 04/17/2022, 11:14 AM

## 2022-05-04 ENCOUNTER — Encounter: Payer: Self-pay | Admitting: Family Medicine

## 2022-06-06 ENCOUNTER — Other Ambulatory Visit: Payer: Self-pay | Admitting: Family Medicine

## 2022-06-06 NOTE — Telephone Encounter (Signed)
Pt would like a call back with reasons why the RX was denied. Please advise.

## 2022-06-07 NOTE — Telephone Encounter (Signed)
Ok with me. Please place any necessary orders. 

## 2022-06-07 NOTE — Telephone Encounter (Signed)
OK to refill? Please advise. Patient unsure of why med was discontinued

## 2022-06-08 ENCOUNTER — Other Ambulatory Visit: Payer: Self-pay

## 2022-06-08 MED ORDER — TIZANIDINE HCL 4 MG PO TABS: 4.0000 mg | ORAL_TABLET | Freq: Four times a day (QID) | ORAL | 0 refills | Status: DC | PRN

## 2022-06-08 NOTE — Telephone Encounter (Signed)
Mychart message sent to patient to inform her refill sent

## 2022-06-13 IMAGING — DX DG ANKLE 2V *L*
3 series · 3 of 3 positions shown · non-contrast
Comparison: None.

CLINICAL DATA: Left ankle pain.  No known injury.

EXAM:
LEFT ANKLE - 2 VIEW

[ankle ap]
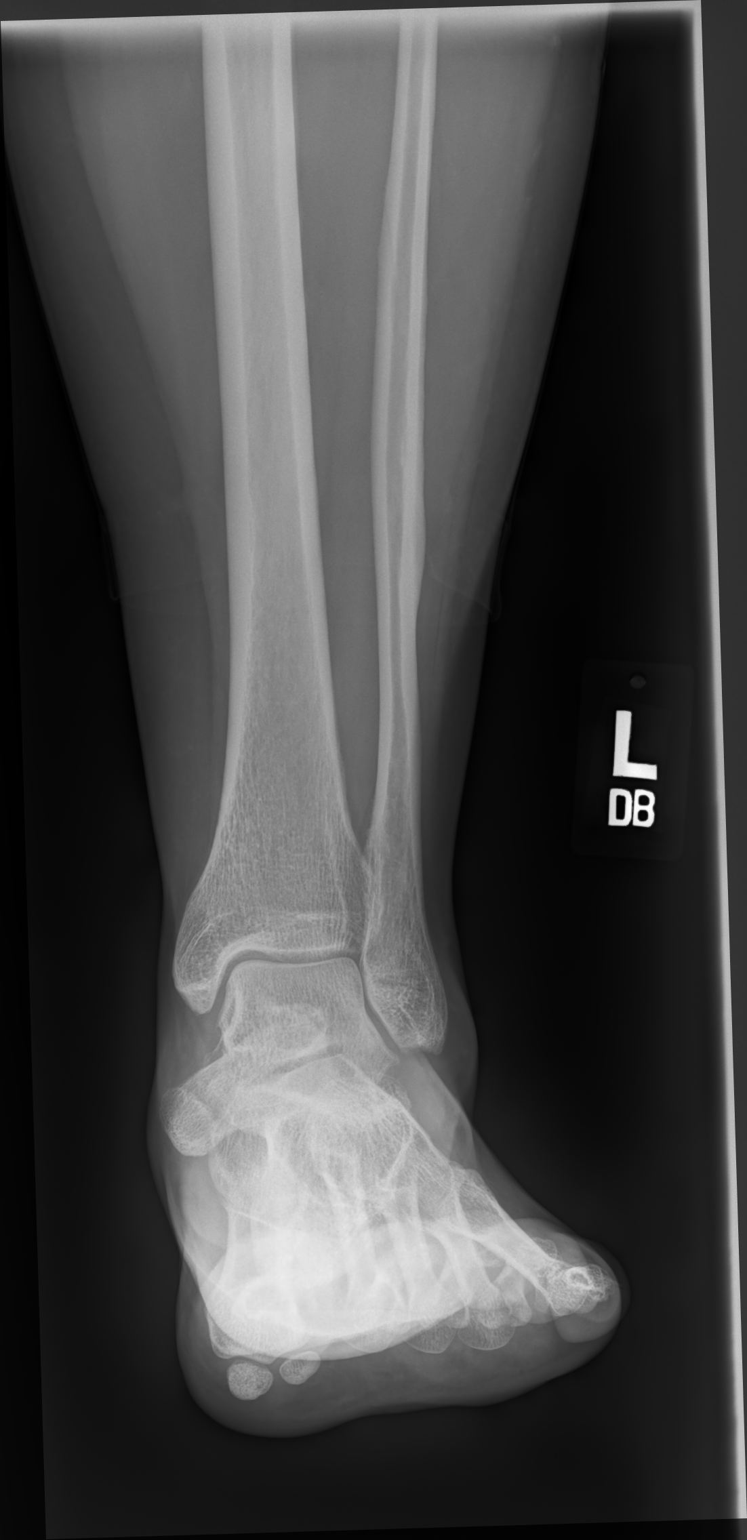

[ankle oblique]
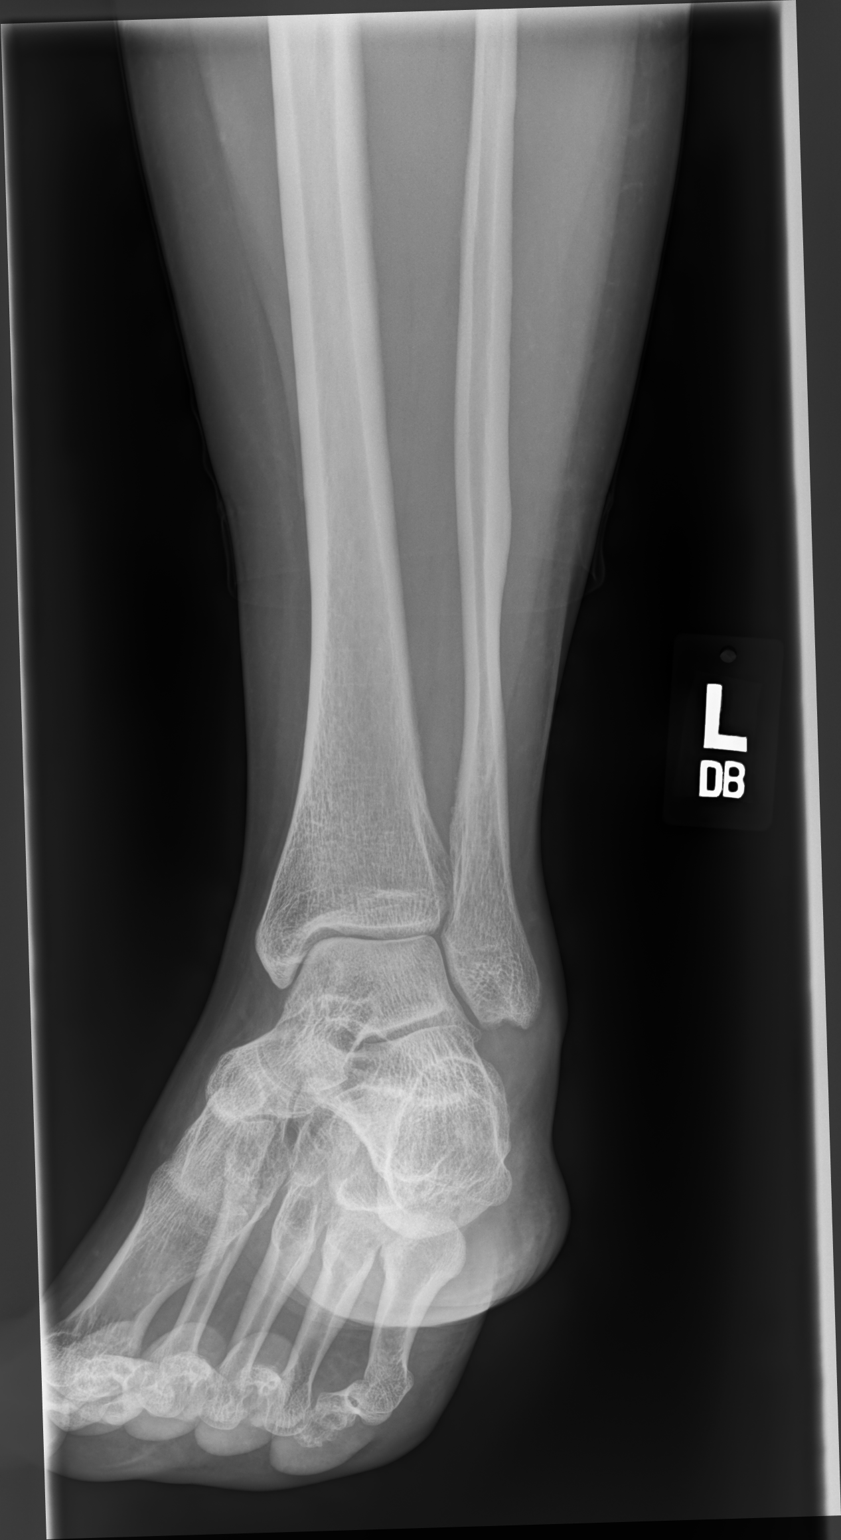

[ankle lat]
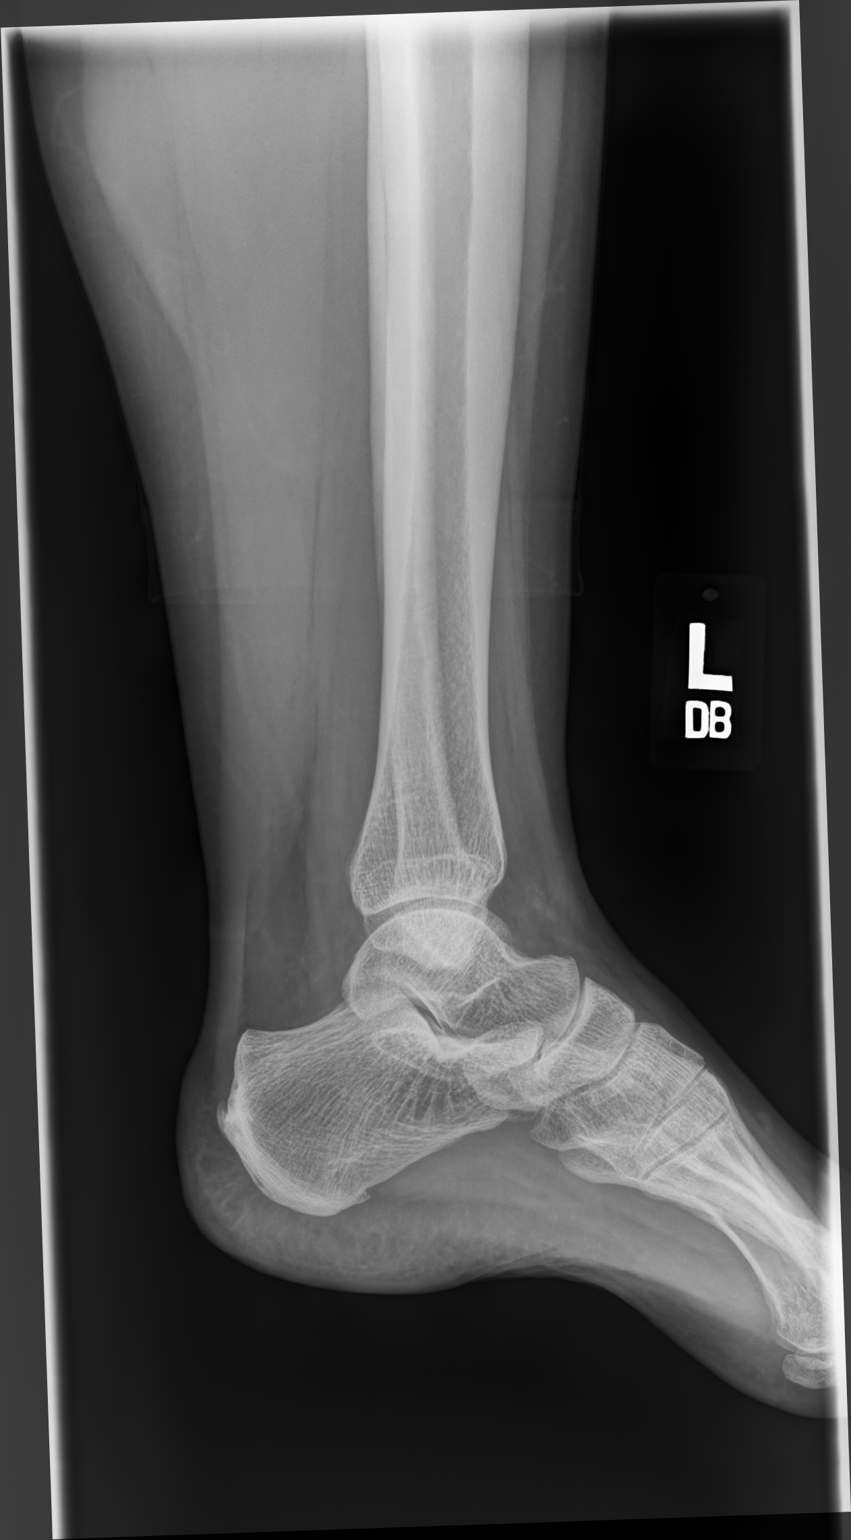

[3 of 3 positions shown; findings below may reference images not displayed]

FINDINGS: There is no evidence of fracture, dislocation, or joint effusion.
Jiodanny Ballagas alignment. Tiny bidirectional calcaneal enthesophytes.
There is no evidence of arthropathy or other focal bone abnormality.
Soft tissues are unremarkable.
IMPRESSION: 1. No acute osseous abnormality.
2. Jiodanny Ballagas alignment.

## 2022-06-15 ENCOUNTER — Ambulatory Visit: Payer: 59 | Admitting: Family Medicine

## 2022-06-15 ENCOUNTER — Encounter: Payer: Self-pay | Admitting: Family Medicine

## 2022-06-15 VITALS — BP 108/74 | HR 78 | Temp 97.1°F | Ht 62.0 in | Wt 206.8 lb

## 2022-06-15 DIAGNOSIS — L409 Psoriasis, unspecified: Secondary | ICD-10-CM

## 2022-06-15 DIAGNOSIS — L4050 Arthropathic psoriasis, unspecified: Secondary | ICD-10-CM

## 2022-06-15 NOTE — Assessment & Plan Note (Signed)
Follows with rheumatology.  Her symptoms have improved significantly on Humira.

## 2022-06-15 NOTE — Progress Notes (Signed)
   Christina Hull is a 32 y.o. female who presents today for an office visit.  Assessment/Plan:  New/Acute Problems: Dehydration secondary to GI illness No red flags.  Doing much better today.  Vomiting has resolved. Tolerating PO well. Still has a little loose stools with diarrhea.  Likely had viral gastroenteritis though she will complete her course of metronidazole that was prescribed in the ED.  She has no clinical signs of dehydration today.  Do not think we need to check any further labs at this point.  She will continue to push fluids.  Recommended probiotics.  Recommended fiber supplementation.  She can also take Imodium as needed.  We discussed reasons to return to care.  Follow-up as needed.  Chronic Problems Addressed Today: Psoriatic arthritis ([place]) Follows with dermatology.  On Humira.  Symptoms have improved significantly.  Psoriasis Follows with rheumatology.  Her symptoms have improved significantly on Humira.     Subjective:  HPI:  Patient here for ED follow up. She went to the ED on 06/11/2022 with vomiting and diarrhea. [place] when she started getting diarrhea and vomiting. This was persistent and she called EMS. In the ED she had labs that showed dehydration.  Was given IV fluids and Zofran.  Vomiting immediately improved.  There was concern for possible Giardia infection and she was started on metronidazole.  Patient was hiking for about 3 days before her symptoms started.  No obvious sick contacts.  No fevers or chills.  Over the last 2 days her vomiting has resolved.  Diarrhea is improving though still does have some occasional loose stools.  Appetite is here baseline.  She is making sure that she is getting plenty of fluids.       Objective:  Physical Exam: BP 108/74   Pulse 78   Temp (!) 97.1 F (36.2 C) (Temporal)   Ht 5\' 2"  (1.575 m)   Wt 206 lb 12.8 oz (93.8 kg)   LMP 06/08/2022   SpO2 96%   BMI 37.82 kg/m   Gen: No  acute distress, resting comfortably CV: Regular rate and rhythm with no murmurs appreciated Pulm: Normal work of breathing, clear to auscultation bilaterally with no crackles, wheezes, or rhonchi Neuro: Grossly normal, moves all extremities Psych: Normal affect and thought content      Chandra Asher M. Jerline Pain, MD 06/15/2022 10:16 AM

## 2022-06-15 NOTE — Assessment & Plan Note (Signed)
Follows with dermatology.  On Humira.  Symptoms have improved significantly.

## 2022-06-15 NOTE — Patient Instructions (Signed)
It was very nice to see you today!  I am glad that you are feeling better!  You can take Imodium if needed.  Please also add on a probiotic.  Let us know if your symptoms do not continue to improve.  Take care, Dr Jerline Pain  PLEASE NOTE:  If you had any lab tests, please let us know if you have not heard back within a few days. You may see your results on mychart before we have a chance to review them but we will give you a call once they are reviewed by [place].   If we ordered any referrals today, please let us know if you have not heard from their office within the next week.   If you had any urgent prescriptions sent in today, please check with the pharmacy within an hour of our visit to make sure the prescription was transmitted appropriately.   Please try these tips to maintain a healthy lifestyle:  Eat at least 3 REAL meals and 1-2 snacks per day.  Aim for no more than 5 hours between eating.  If you eat breakfast, please do so within one hour of getting up.   Each meal should contain half fruits/vegetables, one quarter protein, and one quarter carbs (no bigger than a computer mouse)  Cut down on sweet beverages. This includes juice, soda, and sweet tea.   Drink at least 1 glass of water with each meal and aim for at least 8 glasses per day  Exercise at least 150 minutes every week.

## 2022-09-21 ENCOUNTER — Ambulatory Visit (HOSPITAL_COMMUNITY)
Admission: EM | Admit: 2022-09-21 | Discharge: 2022-09-22 | Disposition: A | Discharge status: Other Acute Inpatient Hospital | Payer: 59 | Attending: Nurse Practitioner | Admitting: Nurse Practitioner

## 2022-09-21 ENCOUNTER — Other Ambulatory Visit: Payer: Self-pay

## 2022-09-21 DIAGNOSIS — F411 Generalized anxiety disorder: Secondary | ICD-10-CM | POA: Diagnosis not present

## 2022-09-21 DIAGNOSIS — Z9151 Personal history of suicidal behavior: Secondary | ICD-10-CM | POA: Diagnosis not present

## 2022-09-21 DIAGNOSIS — E282 Polycystic ovarian syndrome: Secondary | ICD-10-CM | POA: Diagnosis not present

## 2022-09-21 DIAGNOSIS — F1014 Alcohol abuse with alcohol-induced mood disorder: Secondary | ICD-10-CM | POA: Diagnosis not present

## 2022-09-21 DIAGNOSIS — Z79899 Other long term (current) drug therapy: Secondary | ICD-10-CM | POA: Diagnosis not present

## 2022-09-21 DIAGNOSIS — R45851 Suicidal ideations: Secondary | ICD-10-CM | POA: Insufficient documentation

## 2022-09-21 DIAGNOSIS — Z634 Disappearance and death of family member: Secondary | ICD-10-CM | POA: Insufficient documentation

## 2022-09-21 DIAGNOSIS — Z818 Family history of other mental and behavioral disorders: Secondary | ICD-10-CM | POA: Insufficient documentation

## 2022-09-21 DIAGNOSIS — L4050 Arthropathic psoriasis, unspecified: Secondary | ICD-10-CM | POA: Diagnosis not present

## 2022-09-21 DIAGNOSIS — F332 Major depressive disorder, recurrent severe without psychotic features: Secondary | ICD-10-CM | POA: Diagnosis present

## 2022-09-21 LAB — POCT URINE DRUG SCREEN - MANUAL ENTRY (I-SCREEN)
POC Amphetamine UR: NOT DETECTED
POC Buprenorphine (BUP): NOT DETECTED
POC Cocaine UR: NOT DETECTED
POC Marijuana UR: NOT DETECTED
POC Methadone UR: NOT DETECTED
POC Methamphetamine UR: NOT DETECTED
POC Morphine: NOT DETECTED
POC Oxazepam (BZO): NOT DETECTED
POC Oxycodone UR: NOT DETECTED
POC Secobarbital (BAR): NOT DETECTED

## 2022-09-21 LAB — POC URINE PREG, ED: Preg Test, Ur: NEGATIVE

## 2022-09-21 LAB — POCT PREGNANCY, URINE: Preg Test, Ur: NEGATIVE

## 2022-09-21 MED ORDER — DULOXETINE HCL 60 MG PO CPEP
60.0000 mg | ORAL_CAPSULE | Freq: Every day | ORAL | Status: DC
  Administered 2022-09-22: 60 mg via ORAL
  Filled 2022-09-21: qty 1

## 2022-09-21 MED ORDER — TIZANIDINE HCL 2 MG PO TABS: 4.0000 mg | ORAL_TABLET | Freq: Three times a day (TID) | ORAL | Status: DC | PRN

## 2022-09-21 MED ORDER — ALUM & MAG HYDROXIDE-SIMETH 200-200-20 MG/5ML PO SUSP: 30.0000 mL | ORAL | Status: DC | PRN

## 2022-09-21 MED ORDER — ACETAMINOPHEN 325 MG PO TABS: 650.0000 mg | ORAL_TABLET | Freq: Four times a day (QID) | ORAL | Status: DC | PRN

## 2022-09-21 MED ORDER — MAGNESIUM HYDROXIDE 400 MG/5ML PO SUSP: 30.0000 mL | Freq: Every day | ORAL | Status: DC | PRN

## 2022-09-21 MED ORDER — TRAZODONE HCL 50 MG PO TABS
50.0000 mg | ORAL_TABLET | Freq: Every evening | ORAL | Status: DC | PRN
  Administered 2022-09-21: 50 mg via ORAL
  Filled 2022-09-21: qty 1

## 2022-09-21 MED ORDER — HYDROXYZINE HCL 25 MG PO TABS
25.0000 mg | ORAL_TABLET | Freq: Three times a day (TID) | ORAL | Status: DC | PRN
  Administered 2022-09-21: 25 mg via ORAL
  Filled 2022-09-21: qty 1
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
## 2022-09-21 NOTE — ED Provider Notes (Signed)
[facility] Continuous Assessment Admission H&P  Date: 09/21/22 Patient Name: Christina Hull MRN: [id number] Chief Complaint: " I feel very suicidal".  Diagnoses:  Final diagnoses:  Severe episode of recurrent major depressive disorder, without psychotic features (HCC)  Suicidal ideation  Alcohol abuse with alcohol-induced mood disorder (HCC)    HPI:  Christina Hull is a 32 year old Caucasian female with past psychiatric history of major depressive disorder, recurrent severe without psychosis, anxiety, and suicidal ideation who presents voluntarily to [facility] and accompanied by her husband [employer] [phone] due to worsening depressive symptoms resulting in increasing thought of self harming with several plans.   Patient was seen face-to-face by this provider and chart reviewed.  Patient gave permission for her husband Casimiro Needle to remain in the room during this evaluation.  Casimiro Needle declined to provide any collateral. Per chart review, patient has one [facility] admission, from 04/12/22-04/17/22 due to SI.  On evaluation, patient is alert and oriented x 4 and cooperative. Pt appears casually dressed. Eye contact is good. Mood is depressed, affect is congruent with mood. Thought process is coherent and thought content is WDL. Pt endorses active SI with plan, denies HI/AVH. There is no objective indication that the patient is responding to internal stimuli. No delusions elicited during this assessment.    Patient reports "I have chronic SI, where I have these thoughts all the time but with intent today.  I feel very suicidal, I really want to die and I've gone through a lot of plans on how to do it, but my biggest plan is to find someone or anywhere I could find a gun or carbon monoxide poisoning and I have thought about drinking bleach, and it's been escalating last couple of weeks and the intent part has been building up to this point".  Patient identifies her current triggers as "my uncle passed away  recently and I'm close to my cousin who is his daughter, and that just triggered and increased my suicidal intensity, and I also had a small disagreement with my husband".  Patient declined to discuss what their disagreement was about.    Patient endorses a history of suicide attempt in January and one inpatient psychiatric hospitalization.  Patient is established with outpatient psychiatric services and has an appointment to [facility]  Patient also sees a psychiatrist at the same facility for medication management.  Patient reports she is prescribed duloxetine 60 mg daily for depressive symptoms, hydroxyzine for anxiety, and other medications for her psoriatic arthritis and PCOS.  Patient reports she is mostly compliant with her medications, but sometimes misses to take it at the recommended time but eventually does.  Patient endorses substance abuse, reporting she drinks alcohol 3 nights a week on average or a lot on some nights where she consumes a whole box of mixed wine.  Patient reports she last drank 3/thoughts of a wine bottle.  Patient denies other illicit substance use.  Patient reports she lives with her husband and denies access to guns or firearms.  Patient reports she has been diagnosed with borderline personality disorder, but she disagrees.  Patient reports her mother has a psychiatric diagnosis of MDD and bipolar disorder.  Support, encouragement, reassurance provided about ongoing stressors.  The patient is provided with opportunity for questions.  Discussed recommendation for inpatient psychiatric admission for stabilization and treatment.  Discussed milieu and expectations.  Discussed admission to the continuous observation unit for safety monitoring pending transfer  to an inpatient psychiatric unit.  Patient is in agreement. LCSW to seek availability of appropriate bed at the [facility] tonight.  Total Time spent with patient: 20  minutes  Musculoskeletal  Strength & Muscle Tone: within normal limits Gait & Station: normal Patient leans: N/A  Psychiatric Specialty Exam  Presentation General Appearance:  Casual  Eye Contact: Good  Speech: Clear and Coherent  Speech Volume: Normal  Handedness: Right   Mood and Affect  Mood: Depressed  Affect: Congruent   Thought Process  Thought Processes: Coherent  Descriptions of Associations:Intact  Orientation:Full (Time, Place and Person)  Thought Content:WDL  Diagnosis of Schizophrenia or Schizoaffective disorder in past: No   Hallucinations:Hallucinations: None  Ideas of Reference:None  Suicidal Thoughts:Suicidal Thoughts: Yes, Active SI Active Intent and/or Plan: With Plan; With Intent  Homicidal Thoughts:Homicidal Thoughts: No   Sensorium  Memory: Immediate Good  Judgment: Poor  Insight: Poor   Executive Functions  Concentration: Good  Attention Span: Good  Recall: Good  Fund of Knowledge: Good  Language: Good   Psychomotor Activity  Psychomotor Activity: Psychomotor Activity: Normal   Assets  Assets: Communication Skills; Desire for Improvement; Social Support; Intimacy   Sleep  Sleep: Sleep: Fair   Nutritional Assessment (For OBS and FBC admissions only) Has the patient had a weight loss or gain of 10 pounds or more in the last 3 months?: No Has the patient had a decrease in food intake/or appetite?: No Does the patient have dental problems?: No Does the patient have eating habits or behaviors that may be indicators of an eating disorder including binging or inducing vomiting?: No Has the patient recently lost weight without trying?: 0 Has the patient been eating poorly because of a decreased appetite?: 0 Malnutrition Screening Tool Score: 0    Physical Exam Constitutional:      General: She is not in acute distress.    Appearance: She is not diaphoretic.  HENT:     Head: Normocephalic.      Right Ear: External ear normal.     Left Ear: External ear normal.     Nose: No congestion.  Eyes:     General:        Right eye: No discharge.        Left eye: No discharge.  Cardiovascular:     Rate and Rhythm: Normal rate.  Pulmonary:     Effort: No respiratory distress.  Chest:     Chest wall: No tenderness.  Neurological:     Mental Status: She is alert and oriented to person, place, and time.  Psychiatric:        Attention and Perception: Attention and perception normal.        Mood and Affect: Mood is depressed. Affect is flat.        Speech: Speech normal.        Behavior: Behavior is cooperative.        Thought Content: Thought content is not paranoid or delusional. Thought content includes suicidal ideation. Thought content does not include homicidal ideation. Thought content includes suicidal plan. Thought content does not include homicidal plan.        Cognition and Memory: Cognition and memory normal.        Judgment: Judgment is impulsive.    Review of Systems  Constitutional:  Negative for chills, diaphoresis and fever.  HENT:  Negative for congestion.   Eyes:  Negative for discharge.  Respiratory:  Negative for cough, shortness of breath and wheezing.  Cardiovascular:  Negative for chest pain and palpitations.  Gastrointestinal:  Negative for diarrhea, nausea and vomiting.  Neurological:  Negative for dizziness, seizures, loss of consciousness, weakness and headaches.  Psychiatric/Behavioral:  Positive for depression, substance abuse and suicidal ideas.     Blood pressure 117/76, pulse 99, temperature 98.3 F (36.8 C), temperature source Tympanic, resp. rate 18, SpO2 96 %. There is no height or weight on file to calculate BMI.  Past Psychiatric History: See H & P   Is the patient at risk to self? Yes  Has the patient been a risk to self in the past 6 months? Yes .    Has the patient been a risk to self within the distant past? Yes   Is the patient a risk  to others? No   Has the patient been a risk to others in the past 6 months? No   Has the patient been a risk to others within the distant past? No   Past Medical History: See Chart  Family History: Mother has MDD and Bipolar disorder  Social History: N/A  Last Labs:  Abstract on 05/04/2022  Component Date Value Ref Range Status   HM Pap smear 11/26/2021 negative   Final  Admission on 04/12/2022, Discharged on 04/12/2022  Component Date Value Ref Range Status   SARS Coronavirus 2 by RT PCR 04/12/2022 NEGATIVE  NEGATIVE Final   Comment: (NOTE) SARS-CoV-2 target nucleic acids are NOT DETECTED.  The SARS-CoV-2 RNA is generally detectable in upper respiratory specimens during the acute phase of infection. The lowest concentration of SARS-CoV-2 viral copies this assay can detect is 138 copies/mL. A negative result does not preclude SARS-Cov-2 infection and should not be used as the sole basis for treatment or other patient management decisions. A negative result may occur with  improper specimen collection/handling, submission of specimen other than nasopharyngeal swab, presence of viral mutation(s) within the areas targeted by this assay, and inadequate number of viral copies(<138 copies/mL). A negative result must be combined with clinical observations, patient history, and epidemiological information. The expected result is Negative.  Fact Sheet for Patients:  BloggerCourse.com  Fact Sheet for Healthcare Providers:  SeriousBroker.it  This test is no                          t yet approved or cleared by the [place] FDA and  has been authorized for detection and/or diagnosis of SARS-CoV-2 by FDA under an Emergency Use Authorization (EUA). This EUA will remain  in effect (meaning this test can be used) for the duration of the COVID-19 declaration under Section 564(b)(1) of the Act, 21 U.S.C.section 360bbb-3(b)(1), unless  the authorization is terminated  or revoked sooner.       Influenza A by PCR 04/12/2022 NEGATIVE  NEGATIVE Final   Influenza B by PCR 04/12/2022 NEGATIVE  NEGATIVE Final   Comment: (NOTE) The Xpert Xpress SARS-CoV-2/FLU/RSV plus assay is intended as an aid in the diagnosis of influenza from Nasopharyngeal swab specimens and should not be used as a sole basis for treatment. Nasal washings and aspirates are unacceptable for Xpert Xpress SARS-CoV-2/FLU/RSV testing.  Fact Sheet for Patients: BloggerCourse.com  Fact Sheet for Healthcare Providers: SeriousBroker.it  This test is not yet approved or cleared by the [place] FDA and has been authorized for detection and/or diagnosis of SARS-CoV-2 by FDA under an Emergency Use Authorization (EUA). This EUA will remain in effect (meaning this test can be used)  for the duration of the COVID-19 declaration under Section 564(b)(1) of the Act, 21 U.S.C. section 360bbb-3(b)(1), unless the authorization is terminated or revoked.     Resp Syncytial Virus by PCR 04/12/2022 NEGATIVE  NEGATIVE Final   Comment: (NOTE) Fact Sheet for Patients: BloggerCourse.com  Fact Sheet for Healthcare Providers: SeriousBroker.it  This test is not yet approved or cleared by the [place] FDA and has been authorized for detection and/or diagnosis of SARS-CoV-2 by FDA under an Emergency Use Authorization (EUA). This EUA will remain in effect (meaning this test can be used) for the duration of the COVID-19 declaration under Section 564(b)(1) of the Act, 21 U.S.C. section 360bbb-3(b)(1), unless the authorization is terminated or revoked.  Performed at [facility], [address] [place], [place] [address]    WBC 04/12/2022 7.8  4.0 - 10.5 K/uL Final   RBC 04/12/2022 4.82  3.87 - 5.11 MIL/uL Final   Hemoglobin 04/12/2022 15.2 (H)  12.0 - 15.0  g/dL Final   HCT 19/14/7829 42.5  36.0 - 46.0 % Final   MCV 04/12/2022 88.2  80.0 - 100.0 fL Final   MCH 04/12/2022 31.5  26.0 - 34.0 pg Final   MCHC 04/12/2022 35.8  30.0 - 36.0 g/dL Final   RDW 56/21/3086 12.0  11.5 - 15.5 % Final   Platelets 04/12/2022 328  150 - 400 K/uL Final   nRBC 04/12/2022 0.0  0.0 - 0.2 % Final   Neutrophils Relative % 04/12/2022 76  % Final   Neutro Abs 04/12/2022 5.9  1.7 - 7.7 K/uL Final   Lymphocytes Relative 04/12/2022 19  % Final   Lymphs Abs 04/12/2022 1.5  0.7 - 4.0 K/uL Final   Monocytes Relative 04/12/2022 4  % Final   Monocytes Absolute 04/12/2022 0.3  0.1 - 1.0 K/uL Final   Eosinophils Relative 04/12/2022 0  % Final   Eosinophils Absolute 04/12/2022 0.0  0.0 - 0.5 K/uL Final   Basophils Relative 04/12/2022 1  % Final   Basophils Absolute 04/12/2022 0.1  0.0 - 0.1 K/uL Final   Immature Granulocytes 04/12/2022 0  % Final   Abs Immature Granulocytes 04/12/2022 0.02  0.00 - 0.07 K/uL Final   Performed at [facility], [address], [place], [place] [address]   Sodium 04/12/2022 138  135 - 145 mmol/L Final   Potassium 04/12/2022 4.8  3.5 - 5.1 mmol/L Final   Chloride 04/12/2022 100  98 - 111 mmol/L Final   CO2 04/12/2022 27  22 - 32 mmol/L Final   Glucose, Bld 04/12/2022 121 (H)  70 - 99 mg/dL Final   Glucose reference range applies only to samples taken after fasting for at least 8 hours.   BUN 04/12/2022 15  6 - 20 mg/dL Final   Creatinine, Ser 04/12/2022 1.13 (H)  0.44 - 1.00 mg/dL Final   Calcium 96/29/5284 9.6  8.9 - 10.3 mg/dL Final   Total Protein 13/24/4010 7.5  6.5 - 8.1 g/dL Final   Albumin 27/25/3664 4.5  3.5 - 5.0 g/dL Final   AST 40/34/7425 21  15 - 41 U/L Final   ALT 04/12/2022 16  0 - 44 U/L Final   Alkaline Phosphatase 04/12/2022 73  38 - 126 U/L Final   Total Bilirubin 04/12/2022 0.4  0.3 - 1.2 mg/dL Final   GFR, Estimated 04/12/2022 >60  >60 mL/min Final   Comment: (NOTE) Calculated using the CKD-EPI Creatinine Equation  (2021)    Anion gap 04/12/2022 11  5 - 15 Final   Performed at [facility], [address] [place], [place] [address]   Hgb A1c MFr Bld 04/12/2022 4.8  4.8 - 5.6 % Final   Comment: (NOTE) Pre diabetes:          5.7%-6.4%  Diabetes:              >6.4%  Glycemic control for   <7.0% adults with diabetes    Mean Plasma Glucose 04/12/2022 91.06  mg/dL Final   Performed at [facility], [address] [place], [place] [address]   Magnesium 04/12/2022 2.0  1.7 - 2.4 mg/dL Final   Performed at [facility], [address] [place], [place] [address]   Alcohol, Ethyl (B) 04/12/2022 <10  <10 mg/dL Final   Comment: (NOTE) Lowest detectable limit for serum alcohol is 10 mg/dL.  For medical purposes only. Performed at [facility], [address] [place], [place] [address]    Prolactin 04/12/2022 10.9  4.8 - 33.4 ng/mL Final   Comment: (NOTE) Performed At: [facility] [address] [place], [place] [id number] Jolene Schimke MD BM:[id number]    Color, Urine 04/12/2022 YELLOW  YELLOW Final   APPearance 04/12/2022 CLEAR  CLEAR Final   Specific Gravity, Urine 04/12/2022 1.012  1.005 - 1.030 Final   pH 04/12/2022 7.0  5.0 - 8.0 Final   Glucose, UA 04/12/2022 NEGATIVE  NEGATIVE mg/dL Final   Hgb urine dipstick 04/12/2022 SMALL (A)  NEGATIVE Final   Bilirubin Urine 04/12/2022 NEGATIVE  NEGATIVE Final   Ketones, ur 04/12/2022 NEGATIVE  NEGATIVE mg/dL Final   Protein, ur 27/25/3664 NEGATIVE  NEGATIVE mg/dL Final   Nitrite 40/34/7425 NEGATIVE  NEGATIVE Final   Leukocytes,Ua 04/12/2022 NEGATIVE  NEGATIVE Final   RBC / HPF 04/12/2022 0-5  0 - 5 RBC/hpf Final   WBC, UA 04/12/2022 0-5  0 - 5 WBC/hpf Final   Bacteria, UA 04/12/2022 NONE SEEN  NONE SEEN Final   Squamous Epithelial / HPF 04/12/2022 0-5  0 - 5 /HPF Final   Performed at [facility], [address], [place], [place] [address]   POC Amphetamine UR 04/12/2022 None Detected  NONE DETECTED (Cut  Off Level 1000 ng/mL) Final   POC Secobarbital (BAR) 04/12/2022 None Detected  NONE DETECTED (Cut Off Level 300 ng/mL) Final   POC Buprenorphine (BUP) 04/12/2022 None Detected  NONE DETECTED (Cut Off Level 10 ng/mL) Final   POC Oxazepam (BZO) 04/12/2022 None Detected  NONE DETECTED (Cut Off Level 300 ng/mL) Final   POC Cocaine UR 04/12/2022 None Detected  NONE DETECTED (Cut Off Level 300 ng/mL) Final   POC Methamphetamine UR 04/12/2022 None Detected  NONE DETECTED (Cut Off Level 1000 ng/mL) Final   POC Morphine 04/12/2022 None Detected  NONE DETECTED (Cut Off Level 300 ng/mL) Final   POC Methadone UR 04/12/2022 None Detected  NONE DETECTED (Cut Off Level 300 ng/mL) Final   POC Oxycodone UR 04/12/2022 None Detected  NONE DETECTED (Cut Off Level 100 ng/mL) Final   POC Marijuana UR 04/12/2022 None Detected  NONE DETECTED (Cut Off Level 50 ng/mL) Final   SARSCOV2ONAVIRUS 2 AG 04/12/2022 NEGATIVE  NEGATIVE Final   Comment: (NOTE) SARS-CoV-2 antigen NOT DETECTED.   Negative results are presumptive.  Negative results do not preclude SARS-CoV-2 infection and should not be used as the sole basis for treatment or other patient management decisions, including infection  control decisions, particularly in the presence of clinical  signs and  symptoms consistent with COVID-19, or in those who have been in contact with the virus.  Negative results must be combined with clinical observations, patient history, and epidemiological information. The expected result is Negative.  Fact Sheet for Patients: https://www.jennings-kim.com/  Fact Sheet for Healthcare Providers: https://alexander-rogers.biz/  This test is not yet approved or cleared by the [place] FDA and  has been authorized for detection and/or diagnosis of SARS-CoV-2 by FDA under an Emergency Use Authorization (EUA).  This EUA will remain in effect (meaning this test can be used) for the duration of  the COV                           ID-19 declaration under Section 564(b)(1) of the Act, 21 U.S.C. section 360bbb-3(b)(1), unless the authorization is terminated or revoked sooner.     Cholesterol 04/12/2022 198  0 - 200 mg/dL Final   Triglycerides 16/12/9602 98  <150 mg/dL Final   HDL 54/11/8117 70  >40 mg/dL Final   Total CHOL/HDL Ratio 04/12/2022 2.8  RATIO Final   VLDL 04/12/2022 20  0 - 40 mg/dL Final   LDL Cholesterol 04/12/2022 108 (H)  0 - 99 mg/dL Final   Comment:        Total Cholesterol/HDL:CHD Risk Coronary Heart Disease Risk Table                     Men   Women  1/2 Average Risk   3.4   3.3  Average Risk       5.0   4.4  2 X Average Risk   9.6   7.1  3 X Average Risk  23.4   11.0        Use the calculated Patient Ratio above and the CHD Risk Table to determine the patient's CHD Risk.        ATP III CLASSIFICATION (LDL):  <100     mg/dL   Optimal  147-829  mg/dL   Near or Above                    Optimal  130-159  mg/dL   Borderline  562-130  mg/dL   High  >865     mg/dL   Very High Performed at [facility], [address], [place], [place] [address]    TSH 04/12/2022 1.421  0.350 - 4.500 uIU/mL Final   Comment: Performed by a 3rd Generation assay with a functional sensitivity of <=0.01 uIU/mL. Performed at [facility], [address] [place], [place] [address]    Preg Test, Ur 04/12/2022 NEGATIVE  NEGATIVE Final   Comment:        THE SENSITIVITY OF THIS METHODOLOGY IS >24 mIU/mL     Allergies: Patient has no known allergies.  Medications:  Facility Ordered Medications  Medication   acetaminophen (TYLENOL) tablet 650 mg   alum & mag hydroxide-simeth (MAALOX/MYLANTA) 200-200-20 MG/5ML suspension 30 mL   magnesium hydroxide (MILK OF MAGNESIA) suspension 30 mL   hydrOXYzine (ATARAX) tablet 25 mg   [START ON 09/22/2022] DULoxetine (CYMBALTA) DR capsule 60 mg   tiZANidine (ZANAFLEX) tablet 4 mg   traZODone (DESYREL) tablet 50 mg   PTA Medications   Medication Sig   Adalimumab (HUMIRA, 2 PEN,) 40 MG/0.4ML PNKT Inject 40 mg into the skin every 14 (fourteen) days.   DULoxetine (CYMBALTA) 60 MG capsule Take 1 capsule (60 mg  total) by mouth daily.   hydrOXYzine (ATARAX) 25 MG tablet Take 1 tablet (25 mg total) by mouth 3 (three) times daily as needed for anxiety.   traZODone (DESYREL) 50 MG tablet Take 1 tablet (50 mg total) by mouth at bedtime as needed for sleep.   tiZANidine (ZANAFLEX) 4 MG tablet Take 1 tablet (4 mg total) by mouth every 6 (six) hours as needed for muscle spasms.      Medical Decision Making  Recommend inpatient psychiatric admission for stabilization and treatment. LCSW to seek bed placement at the [facility] tonight.   Lab Orders         CBC with Differential/Platelet         Comprehensive metabolic panel         Ethanol         POC urine preg, ED         POCT Urine Drug Screen - (I-Screen)      Home medications restarted -Cymbalta DR 60 mg p.o. daily for depressive symptoms -Zanaflex 4 mg p.o. every 8 hours as needed, muscle spasms -Atarax 25 mg p.o. 3 times daily as needed anxiety -Trazodone 50 mg p.o. nightly as needed insomnia  Other PRNs -Tylenol 650 mg p.o. every 6 hours as needed pain -Maalox 30 ml p.o. every 4 hours as needed indigestion -MOM 30 ml p.o. daily as needed constipation   Recommendations  Based on my evaluation the patient does not appear to have an emergency medical condition.  Recommend inpatient psychiatric admission for stabilization and treatment.  Mancel Bale, NP 09/21/22  10:45 PM

## 2022-09-21 NOTE — ED Notes (Signed)
Pt A&O x 4, presents with suicidal ideations, multiple plans noted. Pt admits to drinking 2 small bottles of wine and having a small argument with her father she reports.  Pt unable to contract for safety.  Monitoring for safety.

## 2022-09-21 NOTE — Progress Notes (Signed)
   09/21/22 2234  [facility] Triage Screening (Walk-ins at [facility] only)  What Is the Reason for Your Visit/Call Today? Christina Hull is a 32 year old female presenting as a voluntary walk-in to [facility] due to [facility] with multiple plans. Patient denied HI, psychosis and drug usage. Patient reported onset of SI was few months and has recently escalated. Patient reported stressors included the death of her cousins father and having a small argument with her husband. Patient reported drinking alcohol 3x weekly and last drank today 2 small bottles of wine. Patient unable to contract for safety.  How Long Has This Been Causing You Problems? 1 wk - 1 month  Have You Recently Had Any Thoughts About Hurting Yourself? Yes  How long ago did you have thoughts about hurting yourself? currently  Are You Planning to Commit Suicide/Harm Yourself At This time? Yes  Have you Recently Had Thoughts About Hurting Someone [name]? No  Are You Planning To Harm Someone At This Time? No  Are you currently experiencing any auditory, visual or other hallucinations? No  Have You Used Any Alcohol or Drugs in the Past 24 Hours? Yes  How long ago did you use Drugs or Alcohol? prior to arrival  What Did You Use and How Much? 2 small bottles of wine  Do you have any current medical co-morbidities that require immediate attention? No  Clinician description of patient physical appearance/behavior: neat / cooperative  What Do You Feel Would Help You the Most Today? Treatment for Depression or other mood problem  If access to [name] was not available, would you have sought care in the Emergency Department? Yes  Determination of Need Emergent (2 hours)  Options For Referral Inpatient Hospitalization;Medication Management;Outpatient Therapy    Flowsheet Row ED from 09/21/2022 in [facility] Most recent reading at 09/21/2022 10:58 PM Admission (Discharged[facility] Most recent reading at 04/13/2022 12:00 AM ED from 04/12/2022 in [facility] Most recent reading at 04/12/2022  5:32 PM  C-SSRS RISK CATEGORY High Risk High Risk High Risk

## 2022-09-22 ENCOUNTER — Inpatient Hospital Stay (HOSPITAL_COMMUNITY)
Admission: AD | Admit: 2022-09-22 | Discharge: 2022-09-27 | DRG: 885 | Disposition: A | Discharge status: Home / Self Care | Payer: 59 | Source: Intra-hospital | Attending: Psychiatry | Admitting: Psychiatry

## 2022-09-22 ENCOUNTER — Encounter (HOSPITAL_COMMUNITY): Payer: Self-pay | Admitting: Behavioral Health

## 2022-09-22 DIAGNOSIS — L4050 Arthropathic psoriasis, unspecified: Secondary | ICD-10-CM | POA: Diagnosis present

## 2022-09-22 DIAGNOSIS — M62838 Other muscle spasm: Secondary | ICD-10-CM | POA: Diagnosis present

## 2022-09-22 DIAGNOSIS — F1014 Alcohol abuse with alcohol-induced mood disorder: Secondary | ICD-10-CM | POA: Diagnosis not present

## 2022-09-22 DIAGNOSIS — Z818 Family history of other mental and behavioral disorders: Secondary | ICD-10-CM

## 2022-09-22 DIAGNOSIS — Y903 Blood alcohol level of 60-79 mg/100 ml: Secondary | ICD-10-CM | POA: Diagnosis present

## 2022-09-22 DIAGNOSIS — K30 Functional dyspepsia: Secondary | ICD-10-CM | POA: Diagnosis present

## 2022-09-22 DIAGNOSIS — F332 Major depressive disorder, recurrent severe without psychotic features: Secondary | ICD-10-CM | POA: Diagnosis not present

## 2022-09-22 DIAGNOSIS — F1010 Alcohol abuse, uncomplicated: Secondary | ICD-10-CM | POA: Diagnosis present

## 2022-09-22 DIAGNOSIS — F411 Generalized anxiety disorder: Secondary | ICD-10-CM | POA: Insufficient documentation

## 2022-09-22 DIAGNOSIS — R45851 Suicidal ideations: Secondary | ICD-10-CM

## 2022-09-22 DIAGNOSIS — F603 Borderline personality disorder: Secondary | ICD-10-CM | POA: Diagnosis present

## 2022-09-22 DIAGNOSIS — Z79899 Other long term (current) drug therapy: Secondary | ICD-10-CM

## 2022-09-22 DIAGNOSIS — F1090 Alcohol use, unspecified, uncomplicated: Secondary | ICD-10-CM | POA: Insufficient documentation

## 2022-09-22 LAB — CBC WITH DIFFERENTIAL/PLATELET
Abs Immature Granulocytes: 0.02 10*3/uL (ref 0.00–0.07)
Basophils Absolute: 0.1 10*3/uL (ref 0.0–0.1)
Basophils Relative: 1 %
Eosinophils Absolute: 0.1 10*3/uL (ref 0.0–0.5)
Eosinophils Relative: 1 %
HCT: 41.7 % (ref 36.0–46.0)
Hemoglobin: 14.2 g/dL (ref 12.0–15.0)
Immature Granulocytes: 0 %
Lymphocytes Relative: 46 %
Lymphs Abs: 3.9 10*3/uL (ref 0.7–4.0)
MCH: 31.3 pg (ref 26.0–34.0)
MCHC: 34.1 g/dL (ref 30.0–36.0)
MCV: 91.9 fL (ref 80.0–100.0)
Monocytes Absolute: 0.4 10*3/uL (ref 0.1–1.0)
Monocytes Relative: 5 %
Neutro Abs: 3.9 10*3/uL (ref 1.7–7.7)
Neutrophils Relative %: 47 %
Platelets: 327 10*3/uL (ref 150–400)
RBC: 4.54 MIL/uL (ref 3.87–5.11)
RDW: 12.6 % (ref 11.5–15.5)
WBC: 8.4 10*3/uL (ref 4.0–10.5)
nRBC: 0 % (ref 0.0–0.2)

## 2022-09-22 LAB — COMPREHENSIVE METABOLIC PANEL
ALT: 21 U/L (ref 0–44)
AST: 22 U/L (ref 15–41)
Albumin: 4.1 g/dL (ref 3.5–5.0)
Alkaline Phosphatase: 75 U/L (ref 38–126)
Anion gap: 12 (ref 5–15)
BUN: 14 mg/dL (ref 6–20)
CO2: 23 mmol/L (ref 22–32)
Calcium: 9.3 mg/dL (ref 8.9–10.3)
Chloride: 105 mmol/L (ref 98–111)
Creatinine, Ser: 0.71 mg/dL (ref 0.44–1.00)
GFR, Estimated: >60 mL/min (ref >60)
Glucose, Bld: 95 mg/dL (ref 70–99)
Potassium: 3.8 mmol/L (ref 3.5–5.1)
Sodium: 140 mmol/L (ref 135–145)
Total Bilirubin: 0.2 mg/dL — ABNORMAL LOW (ref 0.3–1.2)
Total Protein: 6.8 g/dL (ref 6.5–8.1)

## 2022-09-22 LAB — ETHANOL: Alcohol, Ethyl (B): 68 mg/dL — ABNORMAL HIGH (ref <10)

## 2022-09-22 MED ORDER — DIPHENHYDRAMINE HCL 50 MG/ML IJ SOLN: 50.0000 mg | Freq: Three times a day (TID) | INTRAMUSCULAR | Status: DC | PRN

## 2022-09-22 MED ORDER — ACETAMINOPHEN 325 MG PO TABS: 650.0000 mg | ORAL_TABLET | Freq: Four times a day (QID) | ORAL | Status: DC | PRN

## 2022-09-22 MED ORDER — TIZANIDINE HCL 2 MG PO TABS
4.0000 mg | ORAL_TABLET | Freq: Three times a day (TID) | ORAL | Status: DC | PRN
  Filled 2022-09-22: qty 2

## 2022-09-22 MED ORDER — MAGNESIUM HYDROXIDE 400 MG/5ML PO SUSP: 30.0000 mL | Freq: Every day | ORAL | Status: DC | PRN

## 2022-09-22 MED ORDER — LORAZEPAM 2 MG/ML IJ SOLN: 2.0000 mg | Freq: Three times a day (TID) | INTRAMUSCULAR | Status: DC | PRN

## 2022-09-22 MED ORDER — LORAZEPAM 1 MG PO TABS: 2.0000 mg | ORAL_TABLET | Freq: Three times a day (TID) | ORAL | Status: DC | PRN

## 2022-09-22 MED ORDER — HYDROXYZINE HCL 25 MG PO TABS
25.0000 mg | ORAL_TABLET | Freq: Three times a day (TID) | ORAL | Status: DC | PRN
  Administered 2022-09-22 – 2022-09-23 (×2): 25 mg via ORAL
  Filled 2022-09-22 (×2): qty 1

## 2022-09-22 MED ORDER — DIPHENHYDRAMINE HCL 25 MG PO CAPS: 50.0000 mg | ORAL_CAPSULE | Freq: Three times a day (TID) | ORAL | Status: DC | PRN

## 2022-09-22 MED ORDER — DULOXETINE HCL 60 MG PO CPEP
60.0000 mg | ORAL_CAPSULE | Freq: Every day | ORAL | Status: DC
  Administered 2022-09-23 – 2022-09-27 (×5): 60 mg via ORAL
  Filled 2022-09-22 (×7): qty 1

## 2022-09-22 MED ORDER — ALUM & MAG HYDROXIDE-SIMETH 200-200-20 MG/5ML PO SUSP: 30.0000 mL | ORAL | Status: DC | PRN

## 2022-09-22 MED ORDER — HALOPERIDOL 5 MG PO TABS: 5.0000 mg | ORAL_TABLET | Freq: Three times a day (TID) | ORAL | Status: DC | PRN

## 2022-09-22 MED ORDER — TRAZODONE HCL 50 MG PO TABS
50.0000 mg | ORAL_TABLET | Freq: Every evening | ORAL | Status: DC | PRN
  Administered 2022-09-22 – 2022-09-26 (×5): 50 mg via ORAL
  Filled 2022-09-22 (×5): qty 1

## 2022-09-22 MED ORDER — HALOPERIDOL LACTATE 5 MG/ML IJ SOLN: 5.0000 mg | Freq: Three times a day (TID) | INTRAMUSCULAR | Status: DC | PRN

## 2022-09-22 NOTE — Progress Notes (Signed)
Patient admitted to [facility] following SI w/ multiple plans to shoot self with a gun, carbon monoxide poisoning, or drink bleach. Pt endorses Passive SI. Pt contracts for safety. Pt denies HI/AVH.  Patient was cooperative during the admission assessment. Skin assessment complete. Belongings inventoried. Patient oriented to unit and unit rules. Meal and drinks offered to patient. Patient verbalized agreement to treatment plans. Patient verbally contracts for safety during hospitalization. Will continue to monitor for safety.

## 2022-09-22 NOTE — ED Notes (Signed)
Patient alert and oriented.  Denies HI, AVH, and pain.  Pt reports passive SI with no plan.  She stated " I always have these thoughts." Pt verbally contracted to stay safe while on the unit. Scheduled medications administered to patient, per MD orders. Support and encouragement provided.  Routine safety checks conducted every hour.  Patient informed to notify staff with problems or concerns. No adverse drug reactions noted. Patient contracts for safety at this time. Patient compliant with medications and treatment plan. Patient receptive, calm, and cooperative. Patient interacts well with others on the unit.  Patient remains safe at this time.

## 2022-09-22 NOTE — ED Notes (Signed)
Pt resting quietly with eyes closed.  No pain or discomfort noted/voiced.  Breathing is even and unlabored.  Will continue to monitor for safety.  

## 2022-09-22 NOTE — Group Note (Signed)
Date:  09/22/2022 Time:  9:25 PM  Group Topic/Focus:  Wrap-Up Group:   The focus of this group is to help patients review their daily goal of treatment and discuss progress on daily workbooks.    Participation Level:  Did Not Attend   Scot Dock 09/22/2022, 9:25 PM

## 2022-09-22 NOTE — Discharge Instructions (Addendum)
Transfer to [facility] 

## 2022-09-22 NOTE — Progress Notes (Signed)
Pt meets inpatient behavioral health placement per Chinwendu Royston Bake, NP. CSW has requested [facility] to review pt. 1st shift CSW to follow up.   Maryjean Ka, MSW, [facility] 09/22/2022 12:33 AM

## 2022-09-22 NOTE — Plan of Care (Signed)
  Problem: Education: Goal: Knowledge of  General Education information/materials will improve Outcome: Progressing Goal: Emotional status will improve Outcome: Progressing   

## 2022-09-22 NOTE — Progress Notes (Signed)
Pt has been accepted to [facility] TODAY 09/22/2022. Bed assignment: 303-1  Pt meets inpatient criteria per Liborio Nixon, NP  Attending Physician will be Phineas Inches, MD  Report can be called to: - Adult unit: [phone]  Pt can arrive after 12 PM  Care Team Notified: [facility], RN, Liborio Nixon, NP, and [place], LPN  [place], [place]  09/22/2022 10:44 AM

## 2022-09-22 NOTE — Tx Team (Signed)
Initial Treatment Plan 09/22/2022 3:44 PM Christina Hull [id number]    PATIENT STRESSORS: Loss of uncle   Substance abuse     PATIENT STRENGTHS: Ability for insight  Motivation for treatment/growth  Supportive family/friends    PATIENT IDENTIFIED PROBLEMS: Increasing SI and worsening depression  Loss of Uncle  "Small" disagreement with husband                 DISCHARGE CRITERIA:  Ability to meet basic life and health needs Improved stabilization in mood, thinking, and/or behavior Motivation to continue treatment in a less acute level of care Verbal commitment to aftercare and medication compliance  PRELIMINARY DISCHARGE PLAN: Outpatient therapy Participate in family therapy Return to previous living arrangement  PATIENT/FAMILY INVOLVEMENT: This treatment plan has been presented to and reviewed with the patient, Christina Hull.  The patient and family have been given the opportunity to ask questions and make suggestions.  Elpidio Anis, RN 09/22/2022, 3:44 PM

## 2022-09-22 NOTE — ED Notes (Signed)
Report called to Herbert Seta, RN at [facility].  Bed will be ready by noon.  Safe transport will take pt to new facility.  Will call transport closer noon.  Pt aware and voluntary consent signed.

## 2022-09-22 NOTE — ED Notes (Signed)
Patient was offered cereal, muffin and/or oatmeal for breakfast, but patient declined all choices stating that she wasn't hungry. This Clinical research associate informed patient to let her know if she changes her mind.

## 2022-09-22 NOTE — ED Provider Notes (Signed)
FBC/OBS ASAP Discharge Summary  Date and Time: 09/22/2022 10:18 AM  Name: Christina Hull  MRN:  [id number]   Discharge Diagnoses:  Final diagnoses:  Severe episode of recurrent major depressive disorder, without psychotic features (HCC)  Suicidal ideation  Alcohol abuse with alcohol-induced mood disorder (HCC)    Subjective: Patient seen and reevaluated face-to-face by his provider, and chart reviewed. On evaluation, patient is alert and oriented x 4. Her thought process is linear, and speech is clear and coherent at a moderate tone.  Her mood is dysphoric, and affect is congruent.  She endorses active suicidal ideations of not wanting to be alive with no detailed plan. She reports feeling suicidal for the past year.  She reports worsening suicidal ideations for the past 3 days. She reports a past suicide attempt in late January and states that she tried to stab herself with a knife however, that didn't work so she tried to overdose on medications, but her husband stopped her.  She denies a history of self-injurious behaviors. She denies homicidal ideations.  She denies auditory or visual hallucinations. There is no objective evidence that the patient is currently responding to internal or external stimuli.  She denies depressive symptoms.  She reports fair sleep.  She reports a fair appetite.  She identifies current stressors as her uncle recently passed away.  She states that she was not close to her uncle, but she is close to his daughter.  She denies medication side effects to taking Cymbalta. She denies physical complaints. She denies alcohol withdrawal symptoms.    Stay Summary: Christina Hull is a 32 year old Caucasian female with past psychiatric history of major depressive disorder, recurrent severe without psychosis, anxiety, and suicidal ideation who presents voluntarily to [facility] and accompanied by her husband Christina Hull [phone] due to worsening depressive symptoms resulting in increasing  thought of self harming with several plans.   Patient reports "I have chronic SI, where I have these thoughts all the time but with intent today.  I feel very suicidal, I really want to die and I've gone through a lot of plans on how to do it, but my biggest plan is to find someone or anywhere I could find a gun or carbon monoxide poisoning and I have thought about drinking bleach, and it's been escalating last couple of weeks and the intent part has been building up to this point".   Patient identifies her current triggers as "my uncle passed away recently and I'm close to my cousin who is his daughter, and that just triggered and increased my suicidal intensity, and I also had a small disagreement with my husband".  Patient declined to discuss what their disagreement was about.    Total Time spent with patient: 30 minutes  Past Psychiatric History: History of depression, GAD, and past suicide attempt (January 2024). One inpatient psychiatric hospitalization at [facility] from 04/12/22 to 04/17/22.   Past Medical History: history of arthritis and PCOS.  Family Psychiatric History: Brother has a history of BPD. Mother history of MDD and bipolar.   Social History: Patient lives with husband. Patient reports alcohol use, 3 nights per week on average. Patient denies using illicit drugs.   Tobacco Cessation:  Prescription not provided because: declined  Current Medications:  Current Facility-Administered Medications  Medication Dose Route Frequency Provider Last Rate Last Admin   acetaminophen (TYLENOL) tablet 650 mg  650 mg Oral Q6H PRN Onuoha, Chinwendu V, NP       alum & mag  hydroxide-simeth (MAALOX/MYLANTA) 200-200-20 MG/5ML suspension 30 mL  30 mL Oral Q4H PRN Onuoha, Chinwendu V, NP       DULoxetine (CYMBALTA) DR capsule 60 mg  60 mg Oral Daily Onuoha, Chinwendu V, NP   60 mg at 09/22/22 0903   hydrOXYzine (ATARAX) tablet 25 mg  25 mg Oral TID PRN Onuoha, Chinwendu V, NP   25 mg at 09/21/22 2309    magnesium hydroxide (MILK OF MAGNESIA) suspension 30 mL  30 mL Oral Daily PRN Onuoha, Chinwendu V, NP       tiZANidine (ZANAFLEX) tablet 4 mg  4 mg Oral Q8H PRN Onuoha, Chinwendu V, NP       traZODone (DESYREL) tablet 50 mg  50 mg Oral QHS PRN Onuoha, Chinwendu V, NP   50 mg at 09/21/22 2309   Current Outpatient Medications  Medication Sig Dispense Refill   Adalimumab (HUMIRA, 2 PEN,) 40 MG/0.4ML PNKT Inject 40 mg into the skin every 14 (fourteen) days.     DULoxetine (CYMBALTA) 60 MG capsule Take 1 capsule (60 mg total) by mouth daily. 30 capsule 0   hydrOXYzine (ATARAX) 25 MG tablet Take 1 tablet (25 mg total) by mouth 3 (three) times daily as needed for anxiety. 30 tablet 0   tiZANidine (ZANAFLEX) 4 MG tablet Take 1 tablet (4 mg total) by mouth every 6 (six) hours as needed for muscle spasms. 60 tablet 0   traZODone (DESYREL) 50 MG tablet Take 1 tablet (50 mg total) by mouth at bedtime as needed for sleep. 30 tablet 0    PTA Medications:  Facility Ordered Medications  Medication   acetaminophen (TYLENOL) tablet 650 mg   alum & mag hydroxide-simeth (MAALOX/MYLANTA) 200-200-20 MG/5ML suspension 30 mL   magnesium hydroxide (MILK OF MAGNESIA) suspension 30 mL   hydrOXYzine (ATARAX) tablet 25 mg   DULoxetine (CYMBALTA) DR capsule 60 mg   tiZANidine (ZANAFLEX) tablet 4 mg   traZODone (DESYREL) tablet 50 mg   PTA Medications  Medication Sig   Adalimumab (HUMIRA, 2 PEN,) 40 MG/0.4ML PNKT Inject 40 mg into the skin every 14 (fourteen) days.   DULoxetine (CYMBALTA) 60 MG capsule Take 1 capsule (60 mg total) by mouth daily.   hydrOXYzine (ATARAX) 25 MG tablet Take 1 tablet (25 mg total) by mouth 3 (three) times daily as needed for anxiety.   traZODone (DESYREL) 50 MG tablet Take 1 tablet (50 mg total) by mouth at bedtime as needed for sleep.   tiZANidine (ZANAFLEX) 4 MG tablet Take 1 tablet (4 mg total) by mouth every 6 (six) hours as needed for muscle spasms.       06/15/2022    9:30 AM  04/12/2022    3:14 PM 02/01/2022    8:16 AM  Depression screen PHQ 2/9  Decreased Interest 0 3 0  Down, Depressed, Hopeless 0 3 0  PHQ - 2 Score 0 6 0  Altered sleeping  2   Tired, decreased energy  3   Change in appetite  3   Feeling bad or failure about yourself   3   Trouble concentrating  2   Moving slowly or fidgety/restless  0   Suicidal thoughts  3   PHQ-9 Score  22   Difficult doing work/chores  Somewhat difficult     Flowsheet Row ED from 09/21/2022 in [facility] Most recent reading at 09/21/2022 11:49 PM [facility]Discharged[facility] Most recent reading at 04/13/2022 12:00 AM  ED from 04/12/2022 in [facility] Most recent reading at 04/12/2022  5:32 PM  C-SSRS RISK CATEGORY High Risk High Risk High Risk       Musculoskeletal  Strength & Muscle Tone: within normal limits Gait & Station: normal Patient leans: N/A  Psychiatric Specialty Exam  Presentation  General Appearance:  Appropriate for Environment  Eye Contact: Fair  Speech: Clear and Coherent  Speech Volume: Normal  Handedness: Right   Mood and Affect  Mood: Depressed  Affect: Congruent   Thought Process  Thought Processes: Coherent  Descriptions of Associations:Intact  Orientation:Full (Time, Place and Person)  Thought Content:Logical  Diagnosis of Schizophrenia or Schizoaffective disorder in past: No    Hallucinations:Hallucinations: None  Ideas of Reference:None  Suicidal Thoughts:Yes  Homicidal Thoughts:Homicidal Thoughts: No   Sensorium  Memory: Immediate Fair; Recent Fair; Remote Fair  Judgment: Poor  Insight: Poor   Executive Functions  Concentration: Fair  Attention Span: Fair  Recall: [employer] of Knowledge: Fair  Language: Fair   Psychomotor Activity  Psychomotor Activity: Psychomotor Activity: Normal   Assets   Assets: Communication Skills; Desire for Improvement; Financial Resources/Insurance; Housing; Intimacy; Physical Health; Leisure Time   Sleep  Sleep: Sleep: Fair   Nutritional Assessment (For OBS and FBC admissions only) Has the patient had a weight loss or gain of 10 pounds or more in the last 3 months?: No Has the patient had a decrease in food intake/or appetite?: No Does the patient have dental problems?: No Does the patient have eating habits or behaviors that may be indicators of an eating disorder including binging or inducing vomiting?: No Has the patient recently lost weight without trying?: 0 Has the patient been eating poorly because of a decreased appetite?: 0 Malnutrition Screening Tool Score: 0    Physical Exam  Physical Exam HENT:     Head: Normocephalic.     Nose: Nose normal.  Eyes:     Conjunctiva/sclera: Conjunctivae normal.  Cardiovascular:     Rate and Rhythm: Normal rate.  Pulmonary:     Effort: Pulmonary effort is normal.  Musculoskeletal:        General: Normal range of motion.     Cervical back: Normal range of motion.  Neurological:     Mental Status: She is alert and oriented to person, place, and time.    Review of Systems  Constitutional: Negative.   HENT: Negative.    Eyes: Negative.   Respiratory: Negative.    Cardiovascular: Negative.   Gastrointestinal: Negative.   Genitourinary: Negative.   Musculoskeletal: Negative.   Neurological: Negative.   Endo/Heme/Allergies: Negative.    Blood pressure 113/80, pulse 91, temperature 98.4 F (36.9 C), temperature source Oral, resp. rate 18, SpO2 97%. There is no height or weight on file to calculate BMI.   Plan Of Care/Follow-up recommendations:  Activity:  as tolerated  Scheduled medications:   DULoxetine  60 mg Oral Daily   Prn medications: acetaminophen, alum & mag hydroxide-simeth, hydrOXYzine, magnesium hydroxide, tiZANidine, traZODone   Disposition: Patient accepted to [facility] for inpatient psychiatric treatment. Admission orders placed for [facility]. EMTALA completed.   Chenise Mulvihill L, NP 09/22/2022, 10:18 AM

## 2022-09-23 ENCOUNTER — Encounter (HOSPITAL_COMMUNITY): Payer: Self-pay

## 2022-09-23 DIAGNOSIS — F1090 Alcohol use, unspecified, uncomplicated: Secondary | ICD-10-CM | POA: Insufficient documentation

## 2022-09-23 DIAGNOSIS — F411 Generalized anxiety disorder: Secondary | ICD-10-CM | POA: Insufficient documentation

## 2022-09-23 MED ORDER — LOPERAMIDE HCL 2 MG PO CAPS: 2.0000 mg | ORAL_CAPSULE | ORAL | Status: AC | PRN

## 2022-09-23 MED ORDER — ADALIMUMAB 40 MG/0.4ML ~~LOC~~ AJKT: 40.0000 mg | AUTO-INJECTOR | SUBCUTANEOUS | Status: DC

## 2022-09-23 MED ORDER — VITAMIN B-1 100 MG PO TABS
100.0000 mg | ORAL_TABLET | Freq: Every day | ORAL | Status: DC
  Administered 2022-09-24 – 2022-09-27 (×4): 100 mg via ORAL
  Filled 2022-09-23 (×5): qty 1

## 2022-09-23 MED ORDER — ADULT MULTIVITAMIN W/MINERALS CH
1.0000 | ORAL_TABLET | Freq: Every day | ORAL | Status: DC
  Administered 2022-09-23 – 2022-09-27 (×5): 1 via ORAL
  Filled 2022-09-23 (×8): qty 1

## 2022-09-23 MED ORDER — ARIPIPRAZOLE 2 MG PO TABS
2.0000 mg | ORAL_TABLET | Freq: Every day | ORAL | Status: DC
  Administered 2022-09-23 – 2022-09-27 (×5): 2 mg via ORAL
  Filled 2022-09-23 (×8): qty 1

## 2022-09-23 MED ORDER — LORAZEPAM 1 MG PO TABS: 1.0000 mg | ORAL_TABLET | Freq: Four times a day (QID) | ORAL | Status: AC | PRN

## 2022-09-23 MED ORDER — PROPRANOLOL HCL 10 MG PO TABS
10.0000 mg | ORAL_TABLET | Freq: Two times a day (BID) | ORAL | Status: DC
  Administered 2022-09-23 – 2022-09-27 (×8): 10 mg via ORAL
  Filled 2022-09-23 (×14): qty 1

## 2022-09-23 MED ORDER — ONDANSETRON 4 MG PO TBDP: 4.0000 mg | ORAL_TABLET | Freq: Four times a day (QID) | ORAL | Status: AC | PRN

## 2022-09-23 NOTE — Plan of Care (Signed)
  Problem: Education: Goal: Mental status will improve Outcome: Progressing   Problem: Coping: Goal: Ability to verbalize frustrations and anger appropriately will improve Outcome: Progressing   Problem: Health Behavior/Discharge Planning: Goal: Identification of resources available to assist in meeting health care needs will improve Outcome: Progressing   

## 2022-09-23 NOTE — Plan of Care (Addendum)
  Problem: Education: Goal: Knowledge of [facility] Education information/materials will improve Outcome: Progressing   Problem: Activity: Goal: Interest or engagement in activities will improve Outcome: Progressing   Problem: Safety: Goal: Periods of time without injury will increase Outcome: Progressing Pt observed to be animated with bright affect on interactions. Denies HI, AVH and pain when assessed. Rates her depression 6/10 and anxiety 7/10.  Per pt "I've suffered with depression for a while, I guess losing my uncle, thinking about everything that's been going on, it's just a lot. I've been depressed for a while". Reports she slept well last night with fair appetite. Support, encouragement and reassurance offered. Q 15 minutes safety checks and falls precaution maintained without incident.

## 2022-09-23 NOTE — Progress Notes (Signed)
   09/23/22 2304  Psych Admission Type (Psych Patients Only)  Admission Status Voluntary  Psychosocial Assessment  Patient Complaints Anxiety  Eye Contact Fair  Facial Expression Animated  Affect Appropriate to circumstance  Speech Logical/coherent  Interaction Assertive  Motor Activity Fidgety  Appearance/Hygiene Unremarkable  Behavior Characteristics Appropriate to situation  Mood Anxious;Pleasant  Thought Process  Coherency WDL  Content WDL  Delusions None reported or observed  Perception WDL  Hallucination None reported or observed  Judgment Poor  Confusion None  Danger to Self  Current suicidal ideation? Denies  Self-Injurious Behavior No self-injurious ideation or behavior indicators observed or expressed   Agreement Not to Harm Self Yes  Description of Agreement verbal  Danger to Others  Danger to Others None reported or observed

## 2022-09-23 NOTE — [facility] Group Notes (Signed)
The focus of this group is to help patients establish daily goals to achieve during treatment and discuss how the patient can incorporate goal setting into their daily lives to aide in recovery.     Scale 1-10 5 out of 10    Goal: Be more social and talk to Doctor.

## 2022-09-23 NOTE — [facility] Suicide Risk Assessment (Signed)
[facility] INPATIENT:  Family/Significant Other Suicide Prevention Education  Suicide Prevention Education:  Education Completed; Christina Hull, husband [phone],  (name of family member/significant other) has been identified by the patient as the family member/significant other with whom the patient will be residing, and identified as the person(s) who will aid the patient in the event of a mental health crisis (suicidal ideations/suicide attempt).  With written consent from the patient, the family member/significant other has been provided the following suicide prevention education, prior to the and/or following the discharge of the patient.  Safety Planning completed with pt's husband Christina Hull who reported there are  no safety concerns in the home. Husband reported during pt's last admission they purchased locked boxes and a safe to keep medications and knives secured. They have have no firearms in the home. Husband reported that he will speak with pt about the amount of alcohol she is consuming. Husband reported that he will monitor pt's psychotropic medications.   The suicide prevention education provided includes the following: Suicide risk factors Suicide prevention and interventions National Suicide Hotline telephone number [facility] assessment telephone number [facility] Assistance 911 [facility] and/or Residential Mobile Crisis Unit telephone number  Request made of family/significant other to: Remove weapons (e.g., guns, rifles, knives), all items previously/currently identified as safety concern.   Remove drugs/medications (over-the-counter, prescriptions, illicit drugs), all items previously/currently identified as a safety concern.  The family member/significant other verbalizes understanding of the suicide prevention education information provided.  The family member/significant other agrees to remove the items of safety concern listed  above.  Christina Hull R 09/23/2022, 10:07 AM

## 2022-09-23 NOTE — Progress Notes (Signed)
   09/23/22 0000  Psych Admission Type (Psych Patients Only)  Admission Status Voluntary  Psychosocial Assessment  Patient Complaints None  Eye Contact Fair  Facial Expression Animated  Affect Appropriate to circumstance  Speech Logical/coherent  Interaction Hypervigilant  Motor Activity Pacing;Restless  Appearance/Hygiene Unremarkable  Behavior Characteristics Cooperative;Appropriate to situation  Mood Pleasant  Thought Process  Coherency WDL  Content WDL  Delusions None reported or observed  Perception WDL  Hallucination None reported or observed  Judgment Poor  Confusion None  Danger to Self  Current suicidal ideation? Denies  Self-Injurious Behavior No self-injurious ideation or behavior indicators observed or expressed   Agreement Not to Harm Self Yes  Description of Agreement verbal  Danger to Others  Danger to Others None reported or observed
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
## 2022-09-23 NOTE — BH IP Treatment Plan (Signed)
Interdisciplinary Treatment and Diagnostic Plan   09/23/2022 Time of Session: 1035 Christina Hull MRN: [id number]  Principal Diagnosis: MDD (major depressive disorder), recurrent severe, without psychosis (HCC)  Secondary Diagnoses: Principal Problem:   MDD (major depressive disorder), recurrent severe, without psychosis (HCC) Active Problems:   Alcohol use disorder   Current Medications:  Current Facility-Administered Medications  Medication Dose Route Frequency Provider Last Rate Last Admin   acetaminophen (TYLENOL) tablet 650 mg  650 mg Oral Q6H PRN White, Patrice L, NP       alum & mag hydroxide-simeth (MAALOX/MYLANTA) 200-200-20 MG/5ML suspension 30 mL  30 mL Oral Q4H PRN White, Patrice L, NP       diphenhydrAMINE (BENADRYL) capsule 50 mg  50 mg Oral TID PRN White, Patrice L, NP       Or   diphenhydrAMINE (BENADRYL) injection 50 mg  50 mg Intramuscular TID PRN White, Patrice L, NP       DULoxetine (CYMBALTA) DR capsule 60 mg  60 mg Oral Daily White, Patrice L, NP   60 mg at 09/23/22 7829   haloperidol (HALDOL) tablet 5 mg  5 mg Oral TID PRN White, Patrice L, NP       Or   haloperidol lactate (HALDOL) injection 5 mg  5 mg Intramuscular TID PRN White, Patrice L, NP       hydrOXYzine (ATARAX) tablet 25 mg  25 mg Oral TID PRN White, Patrice L, NP   25 mg at 09/22/22 2121   loperamide (IMODIUM) capsule 2-4 mg  2-4 mg Oral PRN Massengill, Harrold Donath, MD       LORazepam (ATIVAN) tablet 2 mg  2 mg Oral TID PRN White, Patrice L, NP       Or   LORazepam (ATIVAN) injection 2 mg  2 mg Intramuscular TID PRN White, Patrice L, NP       LORazepam (ATIVAN) tablet 1 mg  1 mg Oral Q6H PRN Massengill, Nathan, MD       magnesium hydroxide (MILK OF MAGNESIA) suspension 30 mL  30 mL Oral Daily PRN White, Patrice L, NP       multivitamin with minerals tablet 1 tablet  1 tablet Oral Daily Massengill, Nathan, MD       ondansetron (ZOFRAN-ODT) disintegrating tablet 4 mg  4 mg Oral Q6H PRN Massengill,  Harrold Donath, MD       [START ON 09/24/2022] thiamine (Vitamin B-1) tablet 100 mg  100 mg Oral Daily Massengill, Nathan, MD       tiZANidine (ZANAFLEX) tablet 4 mg  4 mg Oral Q8H PRN White, Patrice L, NP       traZODone (DESYREL) tablet 50 mg  50 mg Oral QHS PRN White, Patrice L, NP   50 mg at 09/22/22 2121   PTA Medications: Medications Prior to Admission  Medication Sig Dispense Refill Last Dose   adalimumab (HUMIRA, 2 PEN,) 40 MG/0.4ML pen Inject 40 mg into the skin every 14 (fourteen) days.   09/20/2022   DULoxetine (CYMBALTA) 60 MG capsule Take 1 capsule (60 mg total) by mouth daily. 30 capsule 0    hydrOXYzine (ATARAX) 25 MG tablet Take 1 tablet (25 mg total) by mouth 3 (three) times daily as needed for anxiety. 30 tablet 0    ibuprofen (ADVIL) 200 MG tablet Take 400 mg by mouth every 6 (six) hours as needed (For chronic pain).      tiZANidine (ZANAFLEX) 4 MG tablet Take 1 tablet (4 mg total) by mouth every 6 (six)  hours as needed for muscle spasms. (Patient taking differently: Take 4 mg by mouth at bedtime.) 60 tablet 0    traZODone (DESYREL) 50 MG tablet Take 1 tablet (50 mg total) by mouth at bedtime as needed for sleep. 30 tablet 0     Patient Stressors: Loss of uncle   Substance abuse    Patient Strengths: Ability for insight  Motivation for treatment/growth  Supportive family/friends   Treatment Modalities: Medication Management, Group therapy, Case management,  1 to 1 session with clinician, Psychoeducation, Recreational therapy.   Physician Treatment Plan for Primary Diagnosis: MDD (major depressive disorder), recurrent severe, without psychosis (HCC) Long Term Goal(s): Improvement in symptoms so as ready for discharge   Short Term Goals: Ability to identify and develop effective coping behaviors will improve Ability to maintain clinical measurements within normal limits will improve Compliance with prescribed medications will improve Ability to identify triggers associated with  substance abuse/mental health issues will improve Ability to identify changes in lifestyle to reduce recurrence of condition will improve Ability to verbalize feelings will improve Ability to disclose and discuss suicidal ideas Ability to demonstrate self-control will improve  Medication Management: Evaluate patient's response, side effects, and tolerance of medication regimen.  Therapeutic Interventions: 1 to 1 sessions, Unit Group sessions and Medication administration.  Evaluation of Outcomes: Progressing  Physician Treatment Plan for Secondary Diagnosis: Principal Problem:   MDD (major depressive disorder), recurrent severe, without psychosis (HCC) Active Problems:   Alcohol use disorder  Long Term Goal(s): Improvement in symptoms so as ready for discharge   Short Term Goals: Ability to identify and develop effective coping behaviors will improve Ability to maintain clinical measurements within normal limits will improve Compliance with prescribed medications will improve Ability to identify triggers associated with substance abuse/mental health issues will improve Ability to identify changes in lifestyle to reduce recurrence of condition will improve Ability to verbalize feelings will improve Ability to disclose and discuss suicidal ideas Ability to demonstrate self-control will improve     Medication Management: Evaluate patient's response, side effects, and tolerance of medication regimen.  Therapeutic Interventions: 1 to 1 sessions, Unit Group sessions and Medication administration.  Evaluation of Outcomes: Progressing   RN Treatment Plan for Primary Diagnosis: MDD (major depressive disorder), recurrent severe, without psychosis (HCC) Long Term Goal(s): Knowledge of disease and therapeutic regimen to maintain health will improve  Short Term Goals: Ability to remain free from injury will improve, Ability to verbalize frustration and anger appropriately will improve,  Ability to demonstrate self-control, Ability to participate in decision making will improve, Ability to verbalize feelings will improve, Ability to disclose and discuss suicidal ideas, Ability to identify and develop effective coping behaviors will improve, and Compliance with prescribed medications will improve  Medication Management: RN will administer medications as ordered by provider, will assess and evaluate patient's response and provide education to patient for prescribed medication. RN will report any adverse and/or side effects to prescribing provider.  Therapeutic Interventions: 1 on 1 counseling sessions, Psychoeducation, Medication administration, Evaluate responses to treatment, Monitor vital signs and CBGs as ordered, Perform/monitor CIWA, COWS, AIMS and Fall Risk screenings as ordered, Perform wound care treatments as ordered.  Evaluation of Outcomes: Progressing   LCSW Treatment Plan for Primary Diagnosis: MDD (major depressive disorder), recurrent severe, without psychosis (HCC) Long Term Goal(s): Safe transition to appropriate next level of care at discharge, Engage patient in therapeutic group addressing interpersonal concerns.  Short Term Goals: Engage patient in aftercare planning with referrals and resources,  Increase social support, Increase ability to appropriately verbalize feelings, Increase emotional regulation, Facilitate acceptance of mental health diagnosis and concerns, Facilitate patient progression through stages of change regarding substance use diagnoses and concerns, Identify triggers associated with mental health/substance abuse issues, and Increase skills for wellness and recovery  Therapeutic Interventions: Assess for all discharge needs, 1 to 1 time with Social worker, Explore available resources and support systems, Assess for adequacy in community support network, Educate family and significant other(s) on suicide prevention, Complete Psychosocial Assessment,  Interpersonal group therapy.  Evaluation of Outcomes: Progressing   Progress in Treatment: Attending groups: Yes. Participating in groups: Yes. Taking medication as prescribed: Yes. Toleration medication: Yes. Family/Significant other contact made: No, will contact:  Husband Christina Hull [phone] Patient understands diagnosis: Yes. Discussing patient identified problems/goals with staff: Yes. Medical problems stabilized or resolved: Yes. Denies suicidal/homicidal ideation: Yes. Issues/concerns per patient self-inventory: Yes. Other: N/A  New problem(s) identified: No, Describe:  None Known  New Short Term/Long Term Goal(s): medication stabilization, elimination of SI thoughts, development of comprehensive mental wellness plan.   Patient Goals:  Medication Stabilization  Discharge Plan or Barriers: Patient recently admitted. CSW will continue to follow and assess for appropriate referrals and possible discharge planning.   Reason for Continuation of Hospitalization: Depression Medication stabilization Suicidal ideation Withdrawal symptoms  Estimated Length of Stay: 3-7 Days  Last 3 [place] Suicide Severity Risk Score: Flowsheet Row [facility]/2022 in [facility] (Discharged) [facility]  C-SSRS RISK CATEGORY High Risk High Risk High Risk       Last PHQ 2/9 Scores:    06/15/2022    9:30 AM 04/12/2022    3:14 PM 02/01/2022    8:16 AM  Depression screen PHQ 2/9  Decreased Interest 0 3 0  Down, Depressed, Hopeless 0 3 0  PHQ - 2 Score 0 6 0  Altered sleeping  2   Tired, decreased energy  3   Change in appetite  3   Feeling bad or failure about yourself   3   Trouble concentrating  2   Moving slowly or fidgety/restless  0   Suicidal thoughts  3   PHQ-9 Score  22   Difficult doing  work/chores  Somewhat difficult    detox, medication management for mood stabilization; elimination of SI thoughts; development of comprehensive mental wellness/sobriety plan     Scribe for Treatment Team: Ane Payment, LCSW 09/23/2022 1:32 PM

## 2022-09-23 NOTE — BHH Suicide Risk Assessment (Signed)
Suicide Risk Assessment  Admission Assessment    [facility] Admission Suicide Risk Assessment   Nursing information obtained from:  Patient  Demographic factors:  Caucasian, Unemployed  Current Mental Status:  Suicide plan  Loss Factors:  Loss of significant relationship  Historical Factors:  Impulsivity, Prior suicide attempts  Risk Reduction Factors:  Sense of responsibility to family, Living with another person, especially a relative, Positive social support, Positive therapeutic relationship  Total Time spent with patient: 1 hour  Principal Problem: MDD (major depressive disorder), recurrent severe, without psychosis (HCC)  Diagnosis:  Principal Problem:   MDD (major depressive disorder), recurrent severe, without psychosis (HCC) Active Problems:   Alcohol use disorder  Subjective Data: See H&P.  Continued Clinical Symptoms:  Alcohol Use Disorder Identification Test Final Score (AUDIT): 19 The "Alcohol Use Disorders Identification Test", Guidelines for Use in Primary Care, Second Edition.  World Science writer [facility]). Score between 0-7:  no or low risk or alcohol related problems. Score between 8-15:  moderate risk of alcohol related problems. Score between 16-19:  high risk of alcohol related problems. Score 20 or above:  warrants further diagnostic evaluation for alcohol dependence and treatment.  CLINICAL FACTORS:   Depression:   Comorbid alcohol abuse/dependence Hopelessness Insomnia Severe Alcohol/Substance Abuse/Dependencies Chronic Pain More than one psychiatric diagnosis Unstable or Poor Therapeutic Relationship Previous Psychiatric Diagnoses and Treatments  Musculoskeletal: Strength & Muscle Tone: within normal limits Gait & Station: normal Patient leans: N/A  Psychiatric Specialty Exam:  Presentation  General Appearance:  Casual; Disheveled  Eye Contact: Good  Speech: Clear and Coherent; Normal Rate  Speech  Volume: Normal  Handedness: Right   Mood and Affect  Mood: Depressed; Hopeless  Affect: Depressed; Flat  Thought Process  Thought Processes: Coherent; Goal Directed; Linear  Descriptions of Associations:Intact  Orientation:Full (Time, Place and Person)  Thought Content:Logical; Rumination  History of Schizophrenia/Schizoaffective disorder:No  Duration of Psychotic Symptoms: NA  Hallucinations:Hallucinations: None  Ideas of Reference:None  Suicidal Thoughts:Suicidal Thoughts: Yes, Passive SI Active Intent and/or Plan: Without Intent; Without Plan; Without Means to Carry Out; Without Access to Means SI Passive Intent and/or Plan: Without Intent; Without Plan; Without Means to Carry Out; Without Access to Means  Homicidal Thoughts:Homicidal Thoughts: No  Sensorium  Memory: Immediate Good; Recent Good; Remote Good  Judgment: Fair  Insight: Lacking  Executive Functions  Concentration: Good  Attention Span: Good  Recall: Good  Fund of Knowledge: Fair  Language: Good  Psychomotor Activity  Psychomotor Activity: Psychomotor Activity: Normal  Assets  Assets: Communication Skills; Desire for Improvement; Financial Resources/Insurance; Housing; Physical Health; Resilience; Social Support  Sleep  Sleep: Sleep: Good Number of Hours of Sleep: 8  Physical Exam: See H&P. Blood pressure 119/81, pulse 85, temperature 98 F (36.7 C), temperature source Oral, resp. rate 16, height 5\' 2"  (1.575 m), weight 97.5 kg, SpO2 100%. Body mass index is 39.32 kg/m.  COGNITIVE FEATURES THAT CONTRIBUTE TO RISK:  Closed-mindedness, Polarized thinking, and Thought constriction (tunnel vision)    SUICIDE RISK:   Severe:  Frequent, intense, and enduring suicidal ideation, specific plan, no subjective intent, but some objective markers of intent (i.e., choice of lethal method), the method is accessible, some limited preparatory behavior, evidence of impaired  self-control, severe dysphoria/symptomatology, multiple risk factors present, and few if any protective factors, particularly a lack of social support.  PLAN OF CARE: See H&P.  I certify that inpatient services furnished can reasonably be expected to improve the patient's condition.   Armandina Stammer, NP,  pmhnp, fnp-bc 09/23/2022, 11:54 AM

## 2022-09-23 NOTE — BHH Counselor (Signed)
Adult Comprehensive Assessment  Patient ID: Christina Hull, female   DOB: 07/13/1990, 32 y.o.   MRN: [id number]  Information Source: Information source: Patient (PSA completed with pt)  Current Stressors:  Patient states their primary concerns and needs for treatment are:: " ... I am concerned when I have thoughts of not wanting to be here, I have always dealt with mental health issues when I was younger, the thoughts were constant and intrusive but I did not act upon" Patient states their goals for this hospitilization and ongoing recovery are:: " ... 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" Social relationships: " yes, I have good friends but I do not think they like me" Substance abuse: "yes, I drink alcohol and I usally drink alone" Bereavement / Loss: " yes, my uncle passed away"  Living/Environment/Situation:  Living Arrangements: Spouse/significant other Living conditions (as described by patient or guardian): " I live with my husband, we have a nice home" Who else lives in the home?: " my husband" How long has patient lived in current situation?: "7 yrs" What is atmosphere in current home: Comfortable, Loving  Family History:  Marital status: Married Number of Years Married: 7 What types of issues is patient dealing with in the relationship?: none Additional relationship information: none Are you sexually active?: Yes What is your sexual orientation?: Bisexual Has your sexual activity been  affected by drugs, alcohol, medication, or emotional stress?: Yes Does patient have children?: No  Childhood History:  By whom was/is the patient raised?: Mother, Mother/father and step-parent Additional childhood history information: na Description of patient's relationship with caregiver when they were a child: "My relationship with my stepfather has always been bad, I have been close to my mother" Patient's description of current relationship with people who raised him/her: "... it is an ok reltionship with my dad, of with my stepdad and mostly estranged with mother" How were you disciplined when you got in trouble as a child/adolescent?: Spankings Did patient suffer any verbal/emotional/physical/sexual abuse as a child?: No Did patient suffer from severe childhood neglect?: No Has patient ever been sexually abused/assaulted/raped as an adolescent or adult?: No Was the patient ever a victim of a crime or a disaster?: No Witnessed domestic violence?: No Has patient been affected by domestic violence as an adult?: No  Education:  Highest grade of school patient has completed: Probation officer in Biology Currently a student?: No Learning disability?: No  Employment/Work Situation:   Employment Situation: Unemployed Work Stressors: none Patient's Job has Been Impacted by Current Illness: No What is the Longest Time Patient has Held a Job?: 1 year Where was the Patient Employed at that Time?: [employer] Has Patient ever Been in the [employer]?: No  Financial Resources:   Financial resources: Income from spouse Does patient have a representative payee or guardian?: No  Alcohol/Substance Abuse:   What has been your use of drugs/alcohol within the last 12 months?: na If attempted suicide, did drugs/alcohol play a role in this?: Yes Alcohol/Substance Abuse Treatment  Hx: Denies past history If yes, describe treatment: na Has alcohol/substance abuse ever caused legal problems?: No  Social  Support System:   Patient's Community Support System: Good Describe Community Support System: " I have a therapist and psychiatrist" Type of faith/religion: Spiritual How does patient's faith help to cope with current illness?: na  Leisure/Recreation:   Do You Have Hobbies?: Yes Leisure and Hobbies: video games and reading  Strengths/Needs:   What is the patient's perception of their strengths?: "... 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" Patient states they can use these personal strengths during their treatment to contribute to their recovery: " I don't" Patient states these barriers may affect/interfere with their treatment: " no barriers" Patient states these barriers may affect their return to the community: " no barriers" Other important information patient would like considered in planning for their treatment: none  Discharge Plan:   Currently receiving community mental health services: Yes (From Whom) ([facility]) Patient states concerns and preferences for aftercare planning are: " therapy and med mgmt" Patient states they will know when they are safe and ready for discharge when: " having a more hopeful outlook" Does patient have access to transportation?: Yes Does patient have financial barriers related to discharge medications?: No Patient description of barriers related to discharge medications: " No barriers" Will patient be returning to same living situation after discharge?: Yes (pt will b returning home with husband")  Summary/Recommendations:   Summary and Recommendations (to be completed by the evaluator): Christina Hull is a 32 year old female voluntarily admitted to [facility] after presenting to [facility] due to SI with multiple plans. Pt reported stressors as death of paternal uncle, loss of job, physical health, and substance use. Pt reported that she drinks wine quite often, Pt gave CSW permission to speak with her husband who reported that pt does not  drink every night but binge drinks. Pt/husband both reported drinking has become an issue. Pt requesting information on AA meetings in her area. Pt denies SI/HI/AVH. [facility] and medication management. Pt agreeable to DBT Counseling, AA meetings and Volunteer opportunities. Patient will benefit from crisis stabilization, medication evaluation, group therapy and psychoeducation, in addition to case management for discharge planning. At discharge it is recommended that Patient adhere to the established discharge plan and continue in treatment.  Sherby Moncayo R. 09/23/2022

## 2022-09-23 NOTE — Group Note (Signed)
Recreation Therapy Group Note   Group Topic:Problem Solving  Group Date: 09/23/2022 Start Time: 0930 End Time: 1010 Facilitators: Marica Trentham-McCall, LRT,CTRS Location: [address] Dayroom   Goal Area(s) Addresses:  Patient will effectively work with peer towards shared goal.  Patient will identify skills used to make activity successful.  Patient will identify how skills used during activity can be used to reach post d/c goals.    Group Description: Landing Pad. In teams of 3-5, patients were given 12 plastic drinking straws and an equal length of masking tape. Using the materials provided, patients were asked to build a landing pad to catch a golf ball dropped from approximately 5 feet in the air. All materials were required to be used by the team in their design. LRT facilitated post-activity discussion.   Affect/Mood: Appropriate   Participation Level: Engaged   Participation Quality: Independent   Behavior: Appropriate   Speech/Thought Process: Focused   Insight: Good   Judgement: Good   Modes of Intervention: STEM Activity   Patient Response to Interventions:  Engaged   Education Outcome:  Acknowledges education   Clinical Observations/Individualized Feedback: Pt was very focused and was forced to take the leadership role with one peer being called out of group and the other being less engaged. Pt did work to get peer engaged and complete the activity. Pt and peer were able to be successful.     Plan: Continue to engage patient in RT group sessions 2-3x/week.   Traniece Boffa-McCall, LRT,CTRS 09/23/2022 1:14 PM

## 2022-09-24 MED ORDER — IBUPROFEN 400 MG PO TABS
400.0000 mg | ORAL_TABLET | Freq: Four times a day (QID) | ORAL | Status: DC | PRN
  Administered 2022-09-25 – 2022-09-26 (×3): 400 mg via ORAL
  Filled 2022-09-24 (×3): qty 1

## 2022-09-24 MED ORDER — HYDROXYZINE HCL 10 MG PO TABS
10.0000 mg | ORAL_TABLET | Freq: Three times a day (TID) | ORAL | Status: DC | PRN
  Administered 2022-09-24 – 2022-09-25 (×2): 10 mg via ORAL
  Filled 2022-09-24 (×3): qty 1

## 2022-09-24 NOTE — Progress Notes (Signed)
   09/24/22 2220  Psych Admission Type (Psych Patients Only)  Admission Status Voluntary  Psychosocial Assessment  Patient Complaints Anxiety  Eye Contact Fair  Facial Expression Animated  Affect Appropriate to circumstance  Speech Logical/coherent  Interaction Assertive  Motor Activity Pacing  Appearance/Hygiene Unremarkable  Behavior Characteristics Appropriate to situation  Mood Anxious;Pleasant  Thought Process  Coherency WDL  Content WDL  Delusions None reported or observed  Perception WDL  Hallucination None reported or observed  Judgment Poor  Confusion None  Danger to Self  Current suicidal ideation? Denies  Self-Injurious Behavior No self-injurious ideation or behavior indicators observed or expressed   Agreement Not to Harm Self Yes  Description of Agreement verbal  Danger to Others  Danger to Others None reported or observed

## 2022-09-24 NOTE — Progress Notes (Addendum)
D. Pt was pleasant, friendly upon approach this am. Pt reported that she slept well last night. Pt stated that her depression "wasn't an issue", but rated her anxiety a 5/10. Pt reported that she had promised her husband that she would attend AA meetings upon discharge, but stated that she was "terrified"  to stop drinking - as this has been her "crutch". Pt has been visible in the milieu, observed interacting appropriately with peers and attending groups. Pt endorsed passive SI, but reported that this has been chronic. Pt denies AVH and does not appear to be responding to internal stimuli. A. Labs and vitals monitored. Pt given and educated on medications. Pt supported emotionally and encouraged to express concerns and ask questions.   R. Pt remains safe with 15 minute checks. Will continue POC.    09/24/22 0900  Psych Admission Type (Psych Patients Only)  Admission Status Voluntary  Psychosocial Assessment  Patient Complaints Anxiety  Eye Contact Fair  Facial Expression Animated  Affect Appropriate to circumstance  Speech Logical/coherent  Interaction Assertive  Motor Activity Fidgety  Appearance/Hygiene Unremarkable  Behavior Characteristics Cooperative;Appropriate to situation  Mood Anxious;Pleasant  Thought Process  Coherency WDL  Content WDL  Delusions None reported or observed  Perception WDL  Hallucination None reported or observed  Judgment Poor  Confusion None  Danger to Self  Current suicidal ideation? Passive  Self-Injurious Behavior No self-injurious ideation or behavior indicators observed or expressed   Agreement Not to Harm Self Yes  Description of Agreement agrees to contact staff before acting on harmful thoughts  Danger to Others  Danger to Others None reported or observed

## 2022-09-24 NOTE — [facility] Group Notes (Signed)
Adult Psychoeducational Group Note  Date:  09/24/2022 Time:  9:10 PM  Group Topic/Focus:  Wrap-Up Group:   The focus of this group is to help patients review their daily goal of treatment and discuss progress on daily workbooks.  Participation Level:  Active  Participation Quality:  Appropriate  Affect:  Appropriate  Cognitive:  Appropriate  Insight: Appropriate  Engagement in Group:  Engaged  Modes of Intervention:  Education  Additional Comments:  Pt goal today was to be social,to become more comfortable about going to AA after she leave treatment. Pt rated her day an 6. Tomorrow pt wants to work on being friends with another female pt.  Amnah Breuer, Sharen Counter 09/24/2022, 9:10 PM

## 2022-09-24 NOTE — Progress Notes (Signed)
[facility] MD Progress Note  09/24/2022 2:15 PM Christina Hull  MRN:  [id number]  Reason for admission: 32 year old Caucasian female with hx of mental illness (major depressive disorder) & probable alcohol use disorder. Patient was a patient in this [facility] last January, 2024 with similar complaint. She was receiving mental health care/medication management on an outpatient basis [facility] in this [place], [place]. She is admitted to the [facility] this time around with complaint of worsening symptoms of depression, chronic suicidal ideations & several plans to hurt herself that worsened in the last one week. Patient went to the [facility] first with her husband & after evaluation, was transferred to the [facility] for further psychiatric evaluation/treatments. A review of her current lab results seem stable. Her toxicology results has shown a BAL of 68.   Daily notes: Christina Hull is seen, chart reviewed. The chart findings discussed with the treatment team. She presents alert, oriented & aware of situation. She is visible on the unit, attending/participating in the group sessions. She presents with an improving but worried affect. She reports, "I'm felling anxious a little bit because I told my husband last evening that I will join the AA meeting after discharge to deal with my drinking too much alcohol. He came to visit me here. I said that to him because I was worried that he will give me an ultimatum if I don't quit drinking heavily, that he will either separate from me or we get a divorce. So to avoid dissolution of my marriage, I promised that I will enroll in AA meetings for a year & see how much it will help me. I know I drink a lot of alcohol, over a half a gallon everyday. I will describe my mood now as emotional & anxious, but open. I'm still feeling suicidal, but no plans or intent to hurt myself. I don't think that I will ever be free of feeling suicidal. It is a chronic thing that has been with me for a  long time. The good news is, I'm now ready to do something about my drinking too much alcohol". Christina Hull currently denies any HI, AVH, delusional thoughts or paranoia. Sh does not appear to be responding to any internal stimuli. She is taking & tolerating her treatment regimen, denies any side effects.  She denies any alcohol withdrawal symptoms. Discussed this case with the attending psychiatrist. See the treatment plan below. Vital signs remain stable.  Principal Problem: MDD (major depressive disorder), recurrent severe, without psychosis (HCC)  Diagnosis: Principal Problem:   MDD (major depressive disorder), recurrent severe, without psychosis (HCC) Active Problems:   Alcohol use disorder   GAD (generalized anxiety disorder)  Total Time spent with patient:  35 minutes  Past Psychiatric History: See H&P.  Past Medical History:  Past Medical History:  Diagnosis Date   PCOS (polycystic ovarian syndrome)    History reviewed. No pertinent surgical history. Family History:  Family History  Problem Relation Age of Onset   Cancer Other    Heart disease Other    Depression Mother    Cancer Mother    COPD Mother    Arthritis Mother    Bipolar disorder Mother    Cancer Paternal Grandmother    Family Psychiatric  History: See H&P.  Social History:  Social History   Substance and Sexual Activity  Alcohol Use None     Social History   Substance and Sexual Activity  Drug Use Not on file  Social History   Socioeconomic History   Marital status: Married    Spouse name: Not on file   Number of children: Not on file   Years of education: Not on file   Highest education level: Not on file  Occupational History   Not on file  Tobacco Use   Smoking status: Former    Types: Cigarettes    Passive exposure: Past   Smokeless tobacco: Never  Substance and Sexual Activity   Alcohol use: Not on file   Drug use: Not on file   Sexual activity: Not on file  Other Topics Concern    Not on file  Social History Narrative   ** Merged History Encounter **       Social Determinants of Health   Financial Resource Strain: Not on file  Food Insecurity: No Food Insecurity (09/22/2022)   Hunger Vital Sign    Worried About Running Out of Food in the Last Year: Never true    Ran Out of Food in the Last Year: Never true  Transportation Needs: No Transportation Needs (09/22/2022)   PRAPARE - Administrator, Civil Service (Medical): No    Lack of Transportation (Non-Medical): No  Physical Activity: Not on file  Stress: Not on file  Social Connections: Unknown (07/26/2021)   Received from [facility]   Social Network    Social Network: Not on file   Additional Social History:   Sleep: Good  Appetite:  Good  Current Medications: Current Facility-Administered Medications  Medication Dose Route Frequency Provider Last Rate Last Admin   acetaminophen (TYLENOL) tablet 650 mg  650 mg Oral Q6H PRN White, Patrice L, NP       [START ON 10/04/2022] adalimumab (HUMIRA) pen 40 mg  40 mg Subcutaneous Q14 Days Khamari Sheehan I, NP       alum & mag hydroxide-simeth (MAALOX/MYLANTA) 200-200-20 MG/5ML suspension 30 mL  30 mL Oral Q4H PRN White, Patrice L, NP       ARIPiprazole (ABILIFY) tablet 2 mg  2 mg Oral Daily Ranny Wiebelhaus I, NP   2 mg at 09/24/22 0804   diphenhydrAMINE (BENADRYL) capsule 50 mg  50 mg Oral TID PRN White, Patrice L, NP       Or   diphenhydrAMINE (BENADRYL) injection 50 mg  50 mg Intramuscular TID PRN White, Patrice L, NP       DULoxetine (CYMBALTA) DR capsule 60 mg  60 mg Oral Daily White, Patrice L, NP   60 mg at 09/24/22 0804   haloperidol (HALDOL) tablet 5 mg  5 mg Oral TID PRN White, Patrice L, NP       Or   haloperidol lactate (HALDOL) injection 5 mg  5 mg Intramuscular TID PRN White, Patrice L, NP       hydrOXYzine (ATARAX) tablet 25 mg  25 mg Oral TID PRN White, Patrice L, NP   25 mg at 09/23/22 2140   loperamide (IMODIUM) capsule 2-4 mg  2-4 mg  Oral PRN Massengill, Harrold Donath, MD       LORazepam (ATIVAN) tablet 2 mg  2 mg Oral TID PRN White, Patrice L, NP       Or   LORazepam (ATIVAN) injection 2 mg  2 mg Intramuscular TID PRN White, Patrice L, NP       LORazepam (ATIVAN) tablet 1 mg  1 mg Oral Q6H PRN Massengill, Nathan, MD       magnesium hydroxide (MILK OF MAGNESIA) suspension 30 mL  30 mL Oral Daily PRN Liborio Nixon L, NP       multivitamin with minerals tablet 1 tablet  1 tablet Oral Daily Massengill, Nathan, MD   1 tablet at 09/24/22 0804   ondansetron (ZOFRAN-ODT) disintegrating tablet 4 mg  4 mg Oral Q6H PRN Massengill, Harrold Donath, MD       propranolol (INDERAL) tablet 10 mg  10 mg Oral Q12H Massengill, Harrold Donath, MD   10 mg at 09/24/22 0804   thiamine (Vitamin B-1) tablet 100 mg  100 mg Oral Daily Massengill, Harrold Donath, MD   100 mg at 09/24/22 0805   tiZANidine (ZANAFLEX) tablet 4 mg  4 mg Oral Q8H PRN White, Patrice L, NP       traZODone (DESYREL) tablet 50 mg  50 mg Oral QHS PRN White, Patrice L, NP   50 mg at 09/23/22 2140   Lab Results: No results found for this or any previous visit (from the past 48 hour(s)).  Blood Alcohol level:  Lab Results  Component Value Date   ETH 68 (H) 09/21/2022   ETH <10 04/12/2022   Metabolic Disorder Labs: Lab Results  Component Value Date   HGBA1C 4.8 04/12/2022   MPG 91.06 04/12/2022   Lab Results  Component Value Date   PROLACTIN 10.9 04/12/2022   Lab Results  Component Value Date   CHOL 198 04/12/2022   TRIG 98 04/12/2022   HDL 70 04/12/2022   CHOLHDL 2.8 04/12/2022   VLDL 20 04/12/2022   LDLCALC 108 (H) 04/12/2022   LDLCALC 87 02/08/2022   Physical Findings: AIMS:  , ,  ,  ,    CIWA:  CIWA-Ar Total: 3 COWS:     Musculoskeletal: Strength & Muscle Tone: within normal limits Gait & Station: normal Patient leans: N/A  Psychiatric Specialty Exam:  Presentation  General Appearance:  Casual; Disheveled  Eye Contact: Good  Speech: Clear and Coherent; Normal  Rate  Speech Volume: Normal  Handedness: Right   Mood and Affect  Mood: Depressed; Hopeless  Affect: Depressed; Flat  Thought Process  Thought Processes: Coherent; Goal Directed; Linear  Descriptions of Associations:Intact  Orientation:Full (Time, Place and Person)  Thought Content:Logical; Rumination  History of Schizophrenia/Schizoaffective disorder:No  Duration of Psychotic Symptoms:No data recorded Hallucinations:Hallucinations: None  Ideas of Reference:None  Suicidal Thoughts:Suicidal Thoughts: Yes, Passive SI Active Intent and/or Plan: Without Intent; Without Plan; Without Means to Carry Out; Without Access to Means SI Passive Intent and/or Plan: Without Intent; Without Plan; Without Means to Carry Out; Without Access to Means  Homicidal Thoughts:Homicidal Thoughts: No  Sensorium  Memory: Immediate Good; Recent Good; Remote Good  Judgment: Fair  Insight: Lacking   Executive Functions  Concentration: Good  Attention Span: Good  Recall: Good  Fund of Knowledge: Fair  Language: Good  Psychomotor Activity  Psychomotor Activity: Psychomotor Activity: Normal  Assets  Assets: Communication Skills; Desire for Improvement; Financial Resources/Insurance; Housing; Physical Health; Resilience; Social Support  Sleep  Sleep: Sleep: Good Number of Hours of Sleep: 8  Physical Exam: Physical Exam Vitals and nursing note reviewed.  HENT:     Mouth/Throat:     Pharynx: Oropharynx is clear.  Eyes:     Pupils: Pupils are equal, round, and reactive to light.  Cardiovascular:     Rate and Rhythm: Normal rate.     Pulses: Normal pulses.  Pulmonary:     Effort: Pulmonary effort is normal.  Genitourinary:    Comments: Deferred Musculoskeletal:        General: Normal  range of motion.     Cervical back: Normal range of motion.  Skin:    General: Skin is warm and dry.  Neurological:     General: No focal deficit present.     Mental Status:  She is alert and oriented to person, place, and time.    Review of Systems  Constitutional:  Negative for chills, diaphoresis and fever.  HENT:  Negative for congestion and sore throat.   Respiratory:  Negative for cough, shortness of breath and wheezing.   Cardiovascular:  Negative for chest pain and palpitations.  Gastrointestinal:  Negative for abdominal pain, constipation, diarrhea, heartburn, nausea and vomiting.  Musculoskeletal:  Negative for joint pain and myalgias.  Neurological:  Negative for dizziness, tingling, tremors, sensory change, speech change, focal weakness, seizures, loss of consciousness, weakness and headaches.  Endo/Heme/Allergies:        NKDA  Psychiatric/Behavioral:  Positive for depression ("improving") and substance abuse (Hx alcohol use disorder). Negative for hallucinations, memory loss and suicidal ideas. The patient is not nervous/anxious and does not have insomnia.    Blood pressure 110/75, pulse 73, temperature 97.9 F (36.6 C), temperature source Oral, resp. rate 16, height 5\' 2"  (1.575 m), weight 97.5 kg, SpO2 100%. Body mass index is 39.32 kg/m.  Treatment Plan Summary: Daily contact with patient to assess and evaluate symptoms and progress in treatment and Medication management.   Continue inpatient hospitalization.  Will continue today 09/24/2022 plan as below except where it is noted.   Principal/active diagnoses.  Major depressive disorder, recurrent episodes.  Alcohol use disorder.    Associated symptoms.  Suicidal ideations with plan.  Suicidal plans.  Hx. Suicide attempt.  Excessive alcohol consumption up to four times a week.   Medical conditions. Psoriatic arthritis.   Plan: The risks/benefits/side-effects/alternatives to the medications in use were discussed in detail with the patient and time was given for patient's questions. The patient consents to medication trial.    -Continue Abilify 2 mg po daily for antidepressant  augmentation.  -Continue Duloxetine 60 mg po daily for depression.  -Continue Hydroxyzine 25 mg po tid prn for anxiety.  -Continue Trazodone 50 mg po Q has prn for insomnia.  -Continue Lorazepam 1 mg po Q 6 hrs prn for CIWA > 10 x 72 hours.   Agitation protocols: Cont as recommended;  -Benadryl 50 mg po or IM tid prn. -Haldol 5 mg po or IM tid prn.  -Lorazepam 2 mg po or IM tid prn.   Medical condition.  -Will resume Humira 40 mg subcutaneous Q 14 days (on 10-04-22).  -Resumed on Zanaflex 4 mg po Q 8 hrs prn for muscle spasms.   Other PRNS -Continue Tylenol 650 mg every 6 hours PRN for mild pain -Continue Maalox 30 ml Q 4 hrs PRN for indigestion -Continue MOM 30 ml po Q 6 hrs for constipation   Safety and Monitoring: Voluntary admission to inpatient psychiatric unit for safety, stabilization and treatment Daily contact with patient to assess and evaluate symptoms and progress in treatment Patient's case to be discussed in multi-disciplinary team meeting Observation Level : q15 minute checks Vital signs: q12 hours Precautions: Safety   Discharge Planning: Social work and case management to assist with discharge planning and identification of hospital follow-up needs prior to discharge Estimated LOS: 5-7 days Discharge Concerns: Need to establish a safety plan; Medication compliance and effectiveness Discharge Goals: Return home with outpatient referrals for mental health follow-up including medication management/psychotherapy  Armandina Stammer, NP, pmhnp, fnp-bc  09/24/2022, 2:15 PM

## 2022-09-24 NOTE — [facility] Group Notes (Signed)
[facility] Group Notes:  (Nursing/MHT/Case Management/Adjunct)  Date:  09/24/2022  Time:  2:19 PM  Type of Therapy:  Psychoeducational Skills  Participation Level:  Active  Participation Quality:  Appropriate  Affect:  Appropriate  Cognitive:  Alert and Appropriate  Insight:  Appropriate  Engagement in Group:  Engaged  Modes of Intervention:  Discussion, Education, and Exploration  Summary of Progress/Problems: Psychoeducational group with RN in which patients were given two poems to use, one '' The owl and the chimpanzee'' and ''watch your thoughts they become actions '' . Both poems focus on identifying negative behavioral actions and identifying healthy coping skills healthy behaviors to help incorporate for improved mental health.   Christina Hull 09/24/2022, 2:19 PM

## 2022-09-24 NOTE — Plan of Care (Signed)
  Problem: Education: Goal: Emotional status will improve Outcome: Progressing   Problem: Activity: Goal: Sleeping patterns will improve Outcome: Progressing   

## 2022-09-24 NOTE — Plan of Care (Signed)
  Problem: Activity: Goal: Interest or engagement in activities will improve Outcome: Progressing   Problem: Education: Goal: Mental status will improve Outcome: Progressing   Problem: Education: Goal: Verbalization of understanding the information provided will improve Outcome: Progressing

## 2022-09-24 NOTE — Group Note (Signed)
LCSW Group Therapy Note  Group Date: 09/24/2022 Start Time: 1000 End Time: 1100   Type of Therapy and Topic:  Group Therapy: Anger Cues and Responses  Participation Level:  Did Not Attend   Description of Group:   In this group, patients learned how to recognize the physical, cognitive, emotional, and behavioral responses they have to anger-provoking situations.  They identified a recent time they became angry and how they reacted.  They analyzed how their reaction was possibly beneficial and how it was possibly unhelpful.  The group discussed a variety of healthier coping skills that could help with such a situation in the future.  Focus was placed on how helpful it is to recognize the underlying emotions to our anger, because working on those can lead to a more permanent solution as well as our ability to focus on the important rather than the urgent.  Therapeutic Goals: Patients will remember their last incident of anger and how they felt emotionally and physically, what their thoughts were at the time, and how they behaved. Patients will identify how their behavior at that time worked for them, as well as how it worked against them. Patients will explore possible new behaviors to use in future anger situations. Patients will learn that anger itself is normal and cannot be eliminated, and that healthier reactions can assist with resolving conflict rather than worsening situations.  Summary of Patient Progress:  Invited, did not attend  Therapeutic Modalities:   Cognitive Behavioral Therapy    Lynnell Chad, LCSWA 09/24/2022  11:09 AM

## 2022-09-24 NOTE — Group Note (Signed)
Date:  09/24/2022 Time:  6:34 PM  Group Topic/Focus:  Social Wellness    Participation Level:  Engaged  Participation Quality:  Attentive  Affect:  Appropriate  Cognitive:  Appropriate  Insight: Appropriate  Engagement in Group:  Engaged  Modes of Intervention:  Discussion and Socialization  Additional Comments:   Pt attended and participated in the Social Wellness group.  Edmund Hilda Tarynn Garling 09/24/2022, 6:34 PM

## 2022-09-24 NOTE — Group Note (Signed)
Date:  09/24/2022 Time:  5:42 AM  Group Topic/Focus:  Wrap-Up Group:   The focus of this group is to help patients review their daily goal of treatment and discuss progress on daily workbooks.  Pt participated in [place].   Participation Level:  Active  Participation Quality:  Appropriate  Affect:  Appropriate  Cognitive:  Appropriate  Insight: Improving  Engagement in Group:  Engaged  Modes of Intervention:  Support  Additional Comments:    Osa Craver 09/24/2022, 5:42 AM

## 2022-09-24 NOTE — Progress Notes (Signed)
   09/24/22 0558  15 Minute Checks  Location Bedroom  Visual Appearance Calm  Behavior Sleeping  Sleep (Behavioral Health Patients Only)  Calculate sleep? (Click Yes once per 24 hr at 0600 safety check) Yes  Documented sleep last 24 hours 8    

## 2022-09-24 NOTE — Group Note (Signed)
Date:  09/24/2022 Time:  4:38 PM  Group Topic/Focus:  Goals Group:   The focus of this group is to help patients establish daily goals to achieve during treatment and discuss how the patient can incorporate goal setting into their daily lives to aide in recovery. Orientation:   The focus of this group is to educate the patient on the purpose and policies of crisis stabilization and provide a format to answer questions about their admission.  The group details unit policies and expectations of patients while admitted.    Participation Level:  Did Not Attend  Participation Quality:   n/a  Affect:   n/a  Cognitive:   n/a  Insight: None  Engagement in Group:   n/a  Modes of Intervention:   n/a  Additional Comments:   Pt did not attend.  Edmund Hilda Carrieann Spielberg 09/24/2022, 4:38 PM

## 2022-09-25 DIAGNOSIS — F332 Major depressive disorder, recurrent severe without psychotic features: Principal | ICD-10-CM

## 2022-09-25 MED ORDER — WHITE PETROLATUM EX OINT
TOPICAL_OINTMENT | CUTANEOUS | Status: AC
  Filled 2022-09-25: qty 5

## 2022-09-25 NOTE — Progress Notes (Signed)
D. Pt has been visible in the milieu, interacting well with peers and staff, and observed attending groups. Pt reported that her anxiety has lessened since yesterday- rated her depression,hopelessness and anxiety a 0/1/3, respectively. Pt continues to endorse intermittent passive SI- but does contract for safety . Pt currently denies HI and AVH  A. Labs and vitals monitored. Pt given and educated on medications. Pt supported emotionally and encouraged to express concerns and ask questions.   R. Pt remains safe with 15 minute checks. Will continue POC.

## 2022-09-25 NOTE — [facility] Group Notes (Signed)
[facility] Group Notes:  (Nursing/MHT/Case Management/Adjunct)  Date:  09/25/2022  Time:  11:04 AM  Type of Therapy:  Psychoeducational Skills  Participation Level:  Active  Participation Quality:  Appropriate and Attentive  Affect:  Appropriate  Cognitive:  Alert and Appropriate  Insight:  Appropriate  Engagement in Group:  Engaged  Modes of Intervention:  Discussion, Education, and Exploration  Summary of Progress/Problems: Patients were given education on identifying warning signs of acute mental health decompensation. They were asked to reflect on recognizing their own signs before hospitalization and identify healthy coping skills to integrate to promote mental wellbeing. Themes of symbolism used to help patients identify how mental wellbeing is integral to daily life, and no one piece can operate alone with car analogy. Patient attended and participated.   Malva Limes 09/25/2022, 11:04 AM

## 2022-09-25 NOTE — [facility] Suicide Risk Assessment (Signed)
[facility] INPATIENT:  Family/Significant Other Suicide Prevention Education  Suicide Prevention Education:  Education Completed; husband Tundra Waldoch [phone],  (name of family member/significant other) has been identified by the patient as the family member/significant other with whom the patient will be residing, and identified as the person(s) who will aid the patient in the event of a mental health crisis (suicidal ideations/suicide attempt).    Husband stated there are no firearms in the home.  He will talk with patient about going to [facility].  With written consent from the patient, the family member/significant other has been provided the following suicide prevention education, prior to the and/or following the discharge of the patient.  The suicide prevention education provided includes the following: Suicide risk factors Suicide prevention and interventions National Suicide Hotline telephone number [facility] assessment telephone number [facility] Emergency Assistance 911 [facility] and/or Residential Mobile Crisis Unit telephone number  Request made of family/significant other to: Remove weapons (e.g., guns, rifles, knives), all items previously/currently identified as safety concern.   Remove drugs/medications (over-the-counter, prescriptions, illicit drugs), all items previously/currently identified as a safety concern.  The family member/significant other verbalizes understanding of the suicide prevention education information provided.  The family member/significant other agrees to remove the items of safety concern listed above.  Christina Hull 09/25/2022, 3:34 PM

## 2022-09-25 NOTE — Progress Notes (Signed)
[facility] MD Progress Note  09/25/2022 2:47 PM Christina Hull  MRN:  [id number]  Reason for admission: 32 year old Caucasian female with hx of mental illness (major depressive disorder) & probable alcohol use disorder. Patient was a patient in this [facility] last January, 2024 with similar complaint. She was receiving mental health care/medication management on an outpatient basis at [facility] in this [place], [place]. She is admitted to the [facility] this time around with complaint of worsening symptoms of depression, chronic suicidal ideations & several plans to hurt herself that worsened in the last one week. Patient went to the [facility] first with her husband & after evaluation, was transferred to the [facility] for further psychiatric evaluation/treatments. A review of her current lab results seem stable. Her toxicology results has shown a BAL of 68.   Daily notes: Christina Hull is seen, chart reviewed. The chart findings discussed with the treatment team. She presents alert, oriented & aware of situation. She is visible on the unit, attending group sessions. Her affect is good/reactive. She reports, "I'm doing well today, but I'm still feeling a little anxious & having suicidal thoughts. I think I enjoyed today's group sessions. The focus of the group session this morning is in dealing with & managing borderline personality disorder. I have been trying to avoid this topic because I was told a long time ago that I have this disorder. I was like not accepting it in the past because I did not want to be labelled as having personality disorder. Now, I'm learning to embrace who I am & work with it. I have always had rough time managing my emotions. I'm going to try to do better. My goal after discharge is to enroll in the weekly AA meetings. I'm doing well on my medicines, no side effects.  I still have the suicidal ideation. It is now a part of me. I don't think it will ever go away. But, I'm never going to kill myself.  I'm sleeping well, I'm doing well on my medicines, no side effects".  Christina Hull currently denies any HI, AVH, delusional thoughts or paranoia. She does not appear to be responding to any internal stimuli. There are no changes made on the current plan of care. Will continue as already in progress. Patient may be discharged tomorrow morning if she maintains stability & has a follow-up appointment for routine psychiatric care/medication management.  Principal Problem: MDD (major depressive disorder), recurrent severe, without psychosis (HCC)  Diagnosis: Principal Problem:   MDD (major depressive disorder), recurrent severe, without psychosis (HCC) Active Problems:   Alcohol use disorder   GAD (generalized anxiety disorder)  Total Time spent with patient:  35 minutes  Past Psychiatric History: See H&P.  Past Medical History:  Past Medical History:  Diagnosis Date   PCOS (polycystic ovarian syndrome)    History reviewed. No pertinent surgical history. Family History:  Family History  Problem Relation Age of Onset   Cancer Other    Heart disease Other    Depression Mother    Cancer Mother    COPD Mother    Arthritis Mother    Bipolar disorder Mother    Cancer Paternal Grandmother    Family Psychiatric  History: See H&P.  Social History:  Social History   Substance and Sexual Activity  Alcohol Use None     Social History   Substance and Sexual Activity  Drug Use Not on file    Social History   Socioeconomic History  Marital status: Married    Spouse name: Not on file   Number of children: Not on file   Years of education: Not on file   Highest education level: Not on file  Occupational History   Not on file  Tobacco Use   Smoking status: Former    Types: Cigarettes    Passive exposure: Past   Smokeless tobacco: Never  Substance and Sexual Activity   Alcohol use: Not on file   Drug use: Not on file   Sexual activity: Not on file  Other Topics Concern   Not on  file  Social History Narrative   ** Merged History Encounter **       Social Determinants of Health   Financial Resource Strain: Not on file  Food Insecurity: No Food Insecurity (09/22/2022)   Hunger Vital Sign    Worried About Running Out of Food in the Last Year: Never true    Ran Out of Food in the Last Year: Never true  Transportation Needs: No Transportation Needs (09/22/2022)   PRAPARE - Administrator, Civil Service (Medical): No    Lack of Transportation (Non-Medical): No  Physical Activity: Not on file  Stress: Not on file  Social Connections: Unknown (07/26/2021)   Received from [facility]   Social Network    Social Network: Not on file   Additional Social History:   Sleep: Good  Appetite:  Good  Current Medications: Current Facility-Administered Medications  Medication Dose Route Frequency Provider Last Rate Last Admin   acetaminophen (TYLENOL) tablet 650 mg  650 mg Oral Q6H PRN White, Patrice L, NP       [START ON 10/04/2022] adalimumab (HUMIRA) pen 40 mg  40 mg Subcutaneous Q14 Days Rein Popov I, NP       alum & mag hydroxide-simeth (MAALOX/MYLANTA) 200-200-20 MG/5ML suspension 30 mL  30 mL Oral Q4H PRN White, Patrice L, NP       ARIPiprazole (ABILIFY) tablet 2 mg  2 mg Oral Daily Cambren Helm I, NP   2 mg at 09/25/22 6045   diphenhydrAMINE (BENADRYL) capsule 50 mg  50 mg Oral TID PRN White, Patrice L, NP       Or   diphenhydrAMINE (BENADRYL) injection 50 mg  50 mg Intramuscular TID PRN White, Patrice L, NP       DULoxetine (CYMBALTA) DR capsule 60 mg  60 mg Oral Daily White, Patrice L, NP   60 mg at 09/25/22 4098   haloperidol (HALDOL) tablet 5 mg  5 mg Oral TID PRN White, Patrice L, NP       Or   haloperidol lactate (HALDOL) injection 5 mg  5 mg Intramuscular TID PRN White, Patrice L, NP       hydrOXYzine (ATARAX) tablet 10 mg  10 mg Oral TID PRN Starleen Blue, NP   10 mg at 09/24/22 1722   ibuprofen (ADVIL) tablet 400 mg  400 mg Oral Q6H PRN  Bobbitt, Shalon E, NP   400 mg at 09/25/22 0754   loperamide (IMODIUM) capsule 2-4 mg  2-4 mg Oral PRN Massengill, Harrold Donath, MD       LORazepam (ATIVAN) tablet 2 mg  2 mg Oral TID PRN White, Patrice L, NP       Or   LORazepam (ATIVAN) injection 2 mg  2 mg Intramuscular TID PRN White, Patrice L, NP       LORazepam (ATIVAN) tablet 1 mg  1 mg Oral Q6H PRN Phineas Inches, MD  magnesium hydroxide (MILK OF MAGNESIA) suspension 30 mL  30 mL Oral Daily PRN White, Patrice L, NP       multivitamin with minerals tablet 1 tablet  1 tablet Oral Daily Massengill, Nathan, MD   1 tablet at 09/25/22 0752   ondansetron (ZOFRAN-ODT) disintegrating tablet 4 mg  4 mg Oral Q6H PRN Massengill, Harrold Donath, MD       propranolol (INDERAL) tablet 10 mg  10 mg Oral Q12H Massengill, Harrold Donath, MD   10 mg at 09/25/22 1610   thiamine (Vitamin B-1) tablet 100 mg  100 mg Oral Daily Massengill, Harrold Donath, MD   100 mg at 09/25/22 0752   tiZANidine (ZANAFLEX) tablet 4 mg  4 mg Oral Q8H PRN White, Patrice L, NP       traZODone (DESYREL) tablet 50 mg  50 mg Oral QHS PRN White, Patrice L, NP   50 mg at 09/24/22 2042   Lab Results: No results found for this or any previous visit (from the past 48 hour(s)).  Blood Alcohol level:  Lab Results  Component Value Date   ETH 68 (H) 09/21/2022   ETH <10 04/12/2022   Metabolic Disorder Labs: Lab Results  Component Value Date   HGBA1C 4.8 04/12/2022   MPG 91.06 04/12/2022   Lab Results  Component Value Date   PROLACTIN 10.9 04/12/2022   Lab Results  Component Value Date   CHOL 198 04/12/2022   TRIG 98 04/12/2022   HDL 70 04/12/2022   CHOLHDL 2.8 04/12/2022   VLDL 20 04/12/2022   LDLCALC 108 (H) 04/12/2022   LDLCALC 87 02/08/2022   Physical Findings: AIMS:  , ,  ,  ,    CIWA:  CIWA-Ar Total: 1 COWS:     Musculoskeletal: Strength & Muscle Tone: within normal limits Gait & Station: normal Patient leans: N/A  Psychiatric Specialty Exam:  Presentation  General  Appearance:  Casual; Fairly Groomed  Eye Contact: Fair  Speech: Clear and Coherent; Normal Rate  Speech Volume: Normal  Handedness: Right   Mood and Affect  Mood: Anxious  Affect: Tearful  Thought Process  Thought Processes: Coherent; Goal Directed; Linear  Descriptions of Associations:Intact  Orientation:Full (Time, Place and Person)  Thought Content:Logical  History of Schizophrenia/Schizoaffective disorder:No  Duration of Psychotic Symptoms:No data recorded Hallucinations:Hallucinations: None   Ideas of Reference:None  Suicidal Thoughts:Suicidal Thoughts: Yes, Passive SI Active Intent and/or Plan: Without Intent; Without Plan; Without Means to Carry Out; Without Access to Means SI Passive Intent and/or Plan: Without Intent; Without Plan; Without Means to Carry Out; Without Access to Means  Homicidal Thoughts:Homicidal Thoughts: No  Sensorium  Memory: Immediate Good; Recent Good; Remote Good  Judgment: Fair  Insight: Fair   Art therapist  Concentration: Good  Attention Span: Good  Recall: Good  Fund of Knowledge: Fair  Language: Good  Psychomotor Activity  Psychomotor Activity: Psychomotor Activity: Normal  Assets  Assets: Communication Skills; Desire for Improvement; Financial Resources/Insurance; Housing; Social Support; Resilience; Physical Health  Sleep  Sleep: Sleep: Good Number of Hours of Sleep: 8  Physical Exam: Physical Exam Vitals and nursing note reviewed.  HENT:     Mouth/Throat:     Pharynx: Oropharynx is clear.  Eyes:     Pupils: Pupils are equal, round, and reactive to light.  Cardiovascular:     Rate and Rhythm: Normal rate.     Pulses: Normal pulses.  Pulmonary:     Effort: Pulmonary effort is normal.  Genitourinary:    Comments: Deferred Musculoskeletal:  General: Normal range of motion.     Cervical back: Normal range of motion.  Skin:    General: Skin is warm and dry.   Neurological:     General: No focal deficit present.     Mental Status: She is alert and oriented to person, place, and time.    Review of Systems  Constitutional:  Negative for chills, diaphoresis and fever.  HENT:  Negative for congestion and sore throat.   Respiratory:  Negative for cough, shortness of breath and wheezing.   Cardiovascular:  Negative for chest pain and palpitations.  Gastrointestinal:  Negative for abdominal pain, constipation, diarrhea, heartburn, nausea and vomiting.  Musculoskeletal:  Negative for joint pain and myalgias.  Neurological:  Negative for dizziness, tingling, tremors, sensory change, speech change, focal weakness, seizures, loss of consciousness, weakness and headaches.  Endo/Heme/Allergies:        NKDA  Psychiatric/Behavioral:  Positive for depression ("improving") and substance abuse (Hx alcohol use disorder). Negative for hallucinations, memory loss and suicidal ideas. The patient is not nervous/anxious and does not have insomnia.    Blood pressure 103/76, pulse 89, temperature (!) 97.4 F (36.3 C), temperature source Oral, resp. rate 16, height 5\' 2"  (1.575 m), weight 97.5 kg, SpO2 99%. Body mass index is 39.32 kg/m.  Treatment Plan Summary: Daily contact with patient to assess and evaluate symptoms and progress in treatment and Medication management.   Continue inpatient hospitalization.  Will continue today 09/25/2022 plan as below except where it is noted.   Principal/active diagnoses.  Major depressive disorder, recurrent episodes.  Alcohol use disorder.    Associated symptoms.  Suicidal ideations with plan.  Suicidal plans.  Hx. Suicide attempt.  Excessive alcohol consumption up to four times a week.   Medical conditions. Psoriatic arthritis.   Plan: The risks/benefits/side-effects/alternatives to the medications in use were discussed in detail with the patient and time was given for patient's questions. The patient consents to  medication trial.    -Continue Abilify 2 mg po daily for antidepressant augmentation.  -Continue Duloxetine 60 mg po daily for depression.  -Continue Hydroxyzine 25 mg po tid prn for anxiety.  -Continue Trazodone 50 mg po Q has prn for insomnia.  -Continue Lorazepam 1 mg po Q 6 hrs prn for CIWA > 10 x 72 hours.   Agitation protocols: Cont as recommended;  -Benadryl 50 mg po or IM tid prn. -Haldol 5 mg po or IM tid prn.  -Lorazepam 2 mg po or IM tid prn.   Medical condition.  -Continue Humira 40 mg subcutaneous Q 14 days (on 10-04-22).  -Continue on Zanaflex 4 mg po Q 8 hrs prn for muscle spasms.   Other PRNS -Continue Tylenol 650 mg every 6 hours PRN for mild pain -Continue Maalox 30 ml Q 4 hrs PRN for indigestion -Continue MOM 30 ml po Q 6 hrs for constipation   Safety and Monitoring: Voluntary admission to inpatient psychiatric unit for safety, stabilization and treatment Daily contact with patient to assess and evaluate symptoms and progress in treatment Patient's case to be discussed in multi-disciplinary team meeting Observation Level : q15 minute checks Vital signs: q12 hours Precautions: Safety   Discharge Planning: Social work and case management to assist with discharge planning and identification of hospital follow-up needs prior to discharge Estimated LOS: 5-7 days Discharge Concerns: Need to establish a safety plan; Medication compliance and effectiveness Discharge Goals: Return home with outpatient referrals for mental health follow-up including medication management/psychotherapy  Armandina Stammer, NP,  pmhnp, [id number] 09/25/2022, 2:47 PM Patient ID: Christina Hull, female   DOB: [birth year], 31 y.o.   MRN: [id number]

## 2022-09-25 NOTE — [facility] Group Notes (Signed)
[facility] Group Notes:  (Nursing/MHT/Case Management/Adjunct)  Date:  09/25/2022  Time:  2000  Type of Therapy:   Wrap up group  Participation Level:  Active  Participation Quality:  Appropriate, Attentive, Sharing, and Supportive  Affect:  Appropriate  Cognitive:  Alert  Insight:  Improving  Engagement in Group:  Engaged  Modes of Intervention:  Clarification, Education, and Support  Summary of Progress/Problems: Positive thinking and positive change were discussed.   Johann Capers S 09/25/2022, 9:00 PM

## 2022-09-25 NOTE — Progress Notes (Signed)
   09/25/22 2244  Psych Admission Type (Psych Patients Only)  Admission Status Voluntary  Psychosocial Assessment  Patient Complaints Anxiety  Eye Contact Fair  Facial Expression Animated  Affect Appropriate to circumstance  Speech Logical/coherent  Interaction Assertive  Motor Activity Fidgety  Appearance/Hygiene Unremarkable  Behavior Characteristics Appropriate to situation  Mood Anxious;Pleasant  Thought Process  Coherency WDL  Content WDL  Delusions None reported or observed  Perception WDL  Hallucination None reported or observed  Judgment Poor  Confusion None  Danger to Self  Current suicidal ideation? Denies  Self-Injurious Behavior No self-injurious ideation or behavior indicators observed or expressed   Agreement Not to Harm Self Yes  Description of Agreement verbal  Danger to Others  Danger to Others None reported or observed

## 2022-09-25 NOTE — Group Note (Signed)
Date:  09/25/2022 Time:  9:59 AM  Group Topic/Focus:  Goals Group:   The focus of this group is to help patients establish daily goals to achieve during treatment and discuss how the patient can incorporate goal setting into their daily lives to aide in recovery.    Participation Level:  Active  Participation Quality:  Appropriate  Affect:  Appropriate  Cognitive:  Appropriate  Insight: Appropriate  Engagement in Group:  Engaged  Modes of Intervention:  Education  Additional Comments:  PT participated in positive affirmation   Gwinda Maine 09/25/2022, 9:59 AM

## 2022-09-25 NOTE — Plan of Care (Signed)
  Problem: Education: Goal: Emotional status will improve Outcome: Progressing   Problem: Education: Goal: Verbalization of understanding the information provided will improve Outcome: Progressing   

## 2022-09-25 NOTE — Plan of Care (Signed)

## 2022-09-25 NOTE — Group Note (Signed)
Date:  09/25/2022 Time:  4:42 PM  Group Topic/Focus:  Building Self Esteem:   The Focus of this group is helping patients become aware of the effects of self-esteem on their lives, the things they and others do that enhance or undermine their self-esteem, seeing the relationship between their level of self-esteem and the choices they make and learning ways to enhance self-esteem.    Participation Level:  Active  Participation Quality:  Appropriate  Affect:  Appropriate  Cognitive:  Appropriate  Insight: Appropriate  Engagement in Group:  Engaged  Modes of Intervention:  Education  Additional Comments:   This group was based on building  self-esteem while  using the micro phones to state one thing I use to do, and and what I do now.the courage to speak out. PT decided to sing a song  Gwinda Maine 09/25/2022, 4:42 PM

## 2022-09-26 MED ORDER — NALTREXONE HCL 50 MG PO TABS
50.0000 mg | ORAL_TABLET | Freq: Every day | ORAL | Status: DC
  Administered 2022-09-26 – 2022-09-27 (×2): 50 mg via ORAL
  Filled 2022-09-26 (×3): qty 1

## 2022-09-26 NOTE — Progress Notes (Signed)
[facility] MD Progress Note  09/26/2022 1:10 PM Christina Hull  MRN:  [id number]  Reason for admission: 32 year old Caucasian female with hx of mental illness (major depressive disorder) & probable alcohol use disorder. Patient was a patient in this [facility] last January, 2024 with similar complaint. She was receiving mental health care/medication management on an outpatient basis at the [facility] in this [place], [place]. She is admitted to the [facility] this time around with complaint of worsening symptoms of depression, chronic suicidal ideations & several plans to hurt herself that worsened in the last one week. Patient went to the [facility] first with her husband & after evaluation, was transferred to the [facility] for further psychiatric evaluation/treatments. A review of her current lab results seem stable. Her toxicology results has shown a BAL of 68.   Yesterday the psychiatry team made the following recommendations:  -Initiate naltrexone 50 mg p.o. daily for alcohol use disorder -Continue Abilify 2 mg po daily for antidepressant augmentation.  -Continue Duloxetine 60 mg po daily for depression.  -Continue Hydroxyzine 25 mg po tid prn for anxiety.  -Continue Trazodone 50 mg po Q has prn for insomnia.  -Continue Lorazepam 1 mg po Q 6 hrs prn for CIWA > 10 x 72 hours.           On assessment today, the pt reports that her mood is is less depressed and rates as #1/10, with 10 being high severity Reports that anxiety is at manageable level and rates as #3/10, with 10 being high severity. Nursing staff report patient sleeping over 8 hours last night and being restful.  Appetite is good Concentration is much improved Energy level is adequate Patient endorses passive suicidal thoughts without intent, plan, or ability to carry it out.  Able to contract for safety with this provider. Denies having any HI.  Denies having psychotic symptoms, however reports intrusive thoughts of thinking about maintaining  1 year sobriety from alcohol as she has promised her husband after discharge from [facility].  Raylie reports planning to attend AA meetings after discharge from the psychiatric hospital to be able to maintain this sobriety.  Reports her goal today is to find out other resources from the social worker on how to get help to reach her goal.  Denies having side effects to current psychiatric medications.   We discussed adjustment to current medication regimen to include adding naltrexone 50 mg p.o. daily, to help with her alcohol use disorder.  Patient is in agreement to start naltrexone as ordered.  Discussed the following psychosocial stressors: Braxtyn reports having major psychosocial stressors to include inability to maintain a job due to alcohol drinking, self-isolation, poor sleep quality, mental illness and mainly her infertility.  Further added, that she would rather adopt a child rather than investment in expensive in-vitro fertilization that may or may not work for her.  Emotional support provided to patient for ongoing stressors.  She reports her goal to attend AA meetings after discharge, maintain her sobriety for 1 year and find a suitable job that would help her to focus on her life goals.   Principal Problem: MDD (major depressive disorder), recurrent severe, without psychosis (HCC)  Diagnosis: Principal Problem:   MDD (major depressive disorder), recurrent severe, without psychosis (HCC) Active Problems:   Alcohol use disorder   GAD (generalized anxiety disorder)  Total Time spent with patient:  35 minutes  Past Psychiatric History: : (Per chart review): Previous Psych Diagnoses: Major depressive disorder, recurrent  severe, without psychosis, anxiety, suicidal ideation Prior inpatient treatment: Patient denies Current/prior [facility]: [facility]  Patient started 04/05/2022 Prior rehab hx: Patient denies Psychotherapy hx: Patient denies History of  suicide: Patient denies History of homicide or aggression: Denies Psychiatric medication history: Yes, see above Psychiatric medication compliance history: No, occasionally skip doses Neuromodulation history: Patient denies Current Psychiatrist: Yes, patient does not remember the name of the psychiatrist at the [facility] Current therapist: Yes, at the [facility]  Past Medical History:  Past Medical History:  Diagnosis Date   PCOS (polycystic ovarian syndrome)    History reviewed. No pertinent surgical history. Family History:  Family History  Problem Relation Age of Onset   Cancer Other    Heart disease Other    Depression Mother    Cancer Mother    COPD Mother    Arthritis Mother    Bipolar disorder Mother    Cancer Paternal Grandmother    Family Psychiatric  History: See H&P.  Social History:  Social History   Substance and Sexual Activity  Alcohol Use None     Social History   Substance and Sexual Activity  Drug Use Not on file    Social History   Socioeconomic History   Marital status: Married    Spouse name: Not on file   Number of children: Not on file   Years of education: Not on file   Highest education level: Not on file  Occupational History   Not on file  Tobacco Use   Smoking status: Former    Types: Cigarettes    Passive exposure: Past   Smokeless tobacco: Never  Substance and Sexual Activity   Alcohol use: Not on file   Drug use: Not on file   Sexual activity: Not on file  Other Topics Concern   Not on file  Social History Narrative   ** Merged History Encounter **       Social Determinants of Health   Financial Resource Strain: Not on file  Food Insecurity: No Food Insecurity (09/22/2022)   Hunger Vital Sign    Worried About Running Out of Food in the Last Year: Never true    Ran Out of Food in the Last Year: Never true  Transportation Needs: No Transportation Needs (09/22/2022)   PRAPARE -  Administrator, Civil Service (Medical): No    Lack of Transportation (Non-Medical): No  Physical Activity: Not on file  Stress: Not on file  Social Connections: Unknown (07/26/2021)   Received from [facility]   Social Network    Social Network: Not on file   Additional Social History:   Sleep: Good  Appetite:  Good  Current Medications: Current Facility-Administered Medications  Medication Dose Route Frequency Provider Last Rate Last Admin   acetaminophen (TYLENOL) tablet 650 mg  650 mg Oral Q6H PRN White, Patrice L, NP       [START ON 10/04/2022] adalimumab (HUMIRA) pen 40 mg  40 mg Subcutaneous Q14 Days Nwoko, Agnes I, NP       alum & mag hydroxide-simeth (MAALOX/MYLANTA) 200-200-20 MG/5ML suspension 30 mL  30 mL Oral Q4H PRN White, Patrice L, NP       ARIPiprazole (ABILIFY) tablet 2 mg  2 mg Oral Daily Nwoko, Agnes I, NP   2 mg at 09/26/22 0809   diphenhydrAMINE (BENADRYL) capsule 50 mg  50 mg Oral TID PRN Layla Barter, NP  Or   diphenhydrAMINE (BENADRYL) injection 50 mg  50 mg Intramuscular TID PRN White, Patrice L, NP       DULoxetine (CYMBALTA) DR capsule 60 mg  60 mg Oral Daily White, Patrice L, NP   60 mg at 09/26/22 0809   haloperidol (HALDOL) tablet 5 mg  5 mg Oral TID PRN White, Patrice L, NP       Or   haloperidol lactate (HALDOL) injection 5 mg  5 mg Intramuscular TID PRN White, Patrice L, NP       hydrOXYzine (ATARAX) tablet 10 mg  10 mg Oral TID PRN Starleen Blue, NP   10 mg at 09/25/22 2117   ibuprofen (ADVIL) tablet 400 mg  400 mg Oral Q6H PRN Bobbitt, Shalon E, NP   400 mg at 09/26/22 0810   LORazepam (ATIVAN) tablet 2 mg  2 mg Oral TID PRN White, Patrice L, NP       Or   LORazepam (ATIVAN) injection 2 mg  2 mg Intramuscular TID PRN White, Patrice L, NP       magnesium hydroxide (MILK OF MAGNESIA) suspension 30 mL  30 mL Oral Daily PRN White, Patrice L, NP       multivitamin with minerals tablet 1 tablet  1 tablet Oral Daily Massengill,  Nathan, MD   1 tablet at 09/26/22 0809   propranolol (INDERAL) tablet 10 mg  10 mg Oral Q12H Massengill, Harrold Donath, MD   10 mg at 09/26/22 0809   thiamine (Vitamin B-1) tablet 100 mg  100 mg Oral Daily Massengill, Nathan, MD   100 mg at 09/26/22 0809   tiZANidine (ZANAFLEX) tablet 4 mg  4 mg Oral Q8H PRN White, Patrice L, NP       traZODone (DESYREL) tablet 50 mg  50 mg Oral QHS PRN White, Patrice L, NP   50 mg at 09/25/22 2117   Lab Results: No results found for this or any previous visit (from the past 48 hour(s)).  Blood Alcohol level:  Lab Results  Component Value Date   ETH 68 (H) 09/21/2022   ETH <10 04/12/2022   Metabolic Disorder Labs: Lab Results  Component Value Date   HGBA1C 4.8 04/12/2022   MPG 91.06 04/12/2022   Lab Results  Component Value Date   PROLACTIN 10.9 04/12/2022   Lab Results  Component Value Date   CHOL 198 04/12/2022   TRIG 98 04/12/2022   HDL 70 04/12/2022   CHOLHDL 2.8 04/12/2022   VLDL 20 04/12/2022   LDLCALC 108 (H) 04/12/2022   LDLCALC 87 02/08/2022   Physical Findings: AIMS:  , ,  ,  ,    CIWA:  CIWA-Ar Total: 0 COWS:     Musculoskeletal: Strength & Muscle Tone: within normal limits Gait & Station: normal Patient leans: N/A  Psychiatric Specialty Exam:  Presentation  General Appearance:  Appropriate for Environment; Casual; Fairly Groomed  Eye Contact: Good  Speech: Clear and Coherent; Normal Rate  Speech Volume: Normal  Handedness: Right  Mood and Affect  Mood: Anxious; Depressed  Affect: Congruent  Thought Process  Thought Processes: Coherent; Goal Directed  Descriptions of Associations:Intact  Orientation:Full (Time, Place and Person)  Thought Content:Logical  History of Schizophrenia/Schizoaffective disorder:No  Duration of Psychotic Symptoms:No data recorded Hallucinations:Hallucinations: None  Ideas of Reference:None  Suicidal Thoughts:Suicidal Thoughts: Yes, Passive (SI passive, however, able to  contract for safety) SI Active Intent and/or Plan: -- (Denies active SI) SI Passive Intent and/or Plan: Without Intent; Without Plan; Without Means  to Carry Out  Homicidal Thoughts:Homicidal Thoughts: No  Sensorium  Memory: Immediate Good; Recent Good  Judgment: Fair  Insight: Fair  Art therapist  Concentration: Fair  Attention Span: Good  Recall: Dudley Major of Knowledge: Fair  Language: Good  Psychomotor Activity  Psychomotor Activity: Psychomotor Activity: Normal  Assets  Assets: Communication Skills; Desire for Improvement; Housing; Physical Health; Resilience; Social Support  Sleep  Sleep: Sleep: Good Number of Hours of Sleep: 8  Physical Exam: Physical Exam Vitals and nursing note reviewed.  Constitutional:      Appearance: She is obese.  HENT:     Head: Normocephalic.     Nose: Nose normal.     Mouth/Throat:     Mouth: Mucous membranes are moist.     Pharynx: Oropharynx is clear.  Eyes:     Pupils: Pupils are equal, round, and reactive to light.  Cardiovascular:     Rate and Rhythm: Normal rate.     Pulses: Normal pulses.  Pulmonary:     Effort: Pulmonary effort is normal.  Abdominal:     Comments: Deferred  Genitourinary:    Comments: Deferred Musculoskeletal:        General: Normal range of motion.     Cervical back: Normal range of motion.  Skin:    General: Skin is warm and dry.  Neurological:     General: No focal deficit present.     Mental Status: She is alert and oriented to person, place, and time.  Psychiatric:        Behavior: Behavior normal.    Review of Systems  Constitutional:  Negative for chills, diaphoresis and fever.  HENT:  Negative for congestion and sore throat.   Respiratory:  Negative for cough, shortness of breath and wheezing.   Cardiovascular:  Negative for chest pain and palpitations.  Gastrointestinal:  Negative for abdominal pain, constipation, diarrhea, heartburn, nausea and vomiting.   Musculoskeletal:  Negative for joint pain and myalgias.  Neurological:  Negative for dizziness, tingling, tremors, sensory change, speech change, focal weakness, seizures, loss of consciousness, weakness and headaches.  Endo/Heme/Allergies:        NKDA  Psychiatric/Behavioral:  Positive for depression ("improving") and substance abuse (Hx alcohol use disorder). Negative for hallucinations, memory loss and suicidal ideas. The patient is not nervous/anxious and does not have insomnia.    Blood pressure 111/76, pulse (!) 109, temperature 98.2 F (36.8 C), temperature source Oral, resp. rate 20, height 5\' 2"  (1.575 m), weight 97.5 kg, SpO2 98%. Body mass index is 39.32 kg/m.  Treatment Plan Summary: Daily contact with patient to assess and evaluate symptoms and progress in treatment and Medication management.   Continue inpatient hospitalization.  Will continue today 09/26/2022 plan as below except where it is noted.   Principal/active diagnoses.  Major depressive disorder, recurrent episodes.  Alcohol use disorder.    Associated symptoms.  Suicidal ideations with plan.  Suicidal plans.  Hx. Suicide attempt.  Excessive alcohol consumption up to four times a week.   Medical conditions. Psoriatic arthritis.   Plan: The risks/benefits/side-effects/alternatives to the medications in use were discussed in detail with the patient and time was given for patient's questions. The patient consents to medication trial.   -Initiate naltrexone 50 mg p.o. daily for alcohol use disorder -Continue Abilify 2 mg po daily for antidepressant augmentation.  -Continue Duloxetine 60 mg po daily for depression.  -Continue Hydroxyzine 25 mg po tid prn for anxiety.  -Continue Trazodone 50 mg po Q has  prn for insomnia.  -Continue Lorazepam 1 mg po Q 6 hrs prn for CIWA > 10 x 72 hours.   Agitation protocols: Cont as recommended;  -Benadryl 50 mg po or IM tid prn. -Haldol 5 mg po or IM tid prn.  -Lorazepam  2 mg po or IM tid prn.   Medical condition.  -Continue Humira 40 mg subcutaneous Q 14 days (on 10-04-22).  -Continue on Zanaflex 4 mg po Q 8 hrs prn for muscle spasms.   Other PRNS -Continue Tylenol 650 mg every 6 hours PRN for mild pain -Continue Maalox 30 ml Q 4 hrs PRN for indigestion -Continue MOM 30 ml po Q 6 hrs for constipation   Safety and Monitoring: Voluntary admission to inpatient psychiatric unit for safety, stabilization and treatment Daily contact with patient to assess and evaluate symptoms and progress in treatment Patient's case to be discussed in multi-disciplinary team meeting Observation Level : q15 minute checks Vital signs: q12 hours Precautions: Safety   Discharge Planning: Social work and case management to assist with discharge planning and identification of hospital follow-up needs prior to discharge Estimated LOS: 5-7 days Discharge Concerns: Need to establish a safety plan; Medication compliance and effectiveness Discharge Goals: Return home with outpatient referrals for mental health follow-up including medication management/psychotherapy  Cecilie Lowers, FNP, pmhnp, fnp-bc 09/26/2022, 1:10 PM Patient ID: Mariel Aloe, female   DOB: [birth year], 31 y.o.   MRN: [id number] Patient ID: NICOYA FRIEL, female   DOB: [birth year], 32 y.o.   MRN: [id number]

## 2022-09-26 NOTE — [facility] Group Notes (Signed)
Spiritual care group on grief and loss facilitated by Chaplain Dyanne Carrel, Bcc  Group Goal: Support / Education around grief and loss  Members engage in facilitated group support and psycho-social education.  Group Description:  Following introductions and group rules, group members engaged in facilitated group dialogue and support around topic of loss, with particular support around experiences of loss in their lives. Group Identified types of loss (relationships / self / things) and identified patterns, circumstances, and changes that precipitate losses. Reflected on thoughts / feelings around loss, normalized grief responses, and recognized variety in grief experience. Group encouraged individual reflection on safe space and on the coping skills that they are already utilizing.  Group drew on Adlerian / Rogerian and narrative framework  Patient Progress: Christina Hull attended group and actively engaged and participated in group conversation and activities.  She shared about the loss of her sister and about how her family honors and remembers her.

## 2022-09-26 NOTE — Plan of Care (Signed)
  Problem: Coping: Goal: Ability to demonstrate self-control will improve Outcome: Progressing   Problem: Activity: Goal: Interest or engagement in leisure activities will improve Outcome: Progressing

## 2022-09-26 NOTE — Group Note (Signed)
Recreation Therapy Group Note   Group Topic:Health and Wellness  Group Date: 09/26/2022 Start Time: 0935 End Time: 1016 Facilitators: Adir Schicker-McCall, LRT,CTRS Location: [address] Dayroom   Goal Area(s) Addresses:  Patient will define components of whole wellness. Patient will verbalize benefit of whole wellness.  Group Description: Exercise. LRT and patients discussed the importance of good physical health. LRT explained the activity to patients. Patients were informed they were going to take turns leading the group in the exercises of their choosing. Patients were to complete at least 20-30 minutes of exercise. Patients were encouraged to take breaks and get water as needed.   Affect/Mood: Appropriate   Participation Level: Engaged   Participation Quality: Independent   Behavior: Appropriate   Speech/Thought Process: Focused   Insight: Good   Judgement: Good   Modes of Intervention: Music and Exercise   Patient Response to Interventions:  Engaged   Education Outcome:  Acknowledges education   Clinical Observations/Individualized Feedback: Pt attended and participated in group session. Pt interacted well with peers as well.     Plan: Continue to engage patient in RT group sessions 2-3x/week.   Crystallee Werden-McCall, LRT,CTRS 09/26/2022 12:25 PM

## 2022-09-26 NOTE — Plan of Care (Signed)

## 2022-09-26 NOTE — Progress Notes (Signed)
   09/26/22 2310  Psych Admission Type (Psych Patients Only)  Admission Status Voluntary  Psychosocial Assessment  Patient Complaints Anxiety  Eye Contact Fair  Facial Expression Animated  Affect Appropriate to circumstance  Speech Logical/coherent  Interaction Assertive  Motor Activity Fidgety  Appearance/Hygiene Unremarkable  Behavior Characteristics Appropriate to situation  Mood Anxious;Pleasant  Aggressive Behavior  Effect No apparent injury  Thought Process  Coherency WDL  Content WDL  Delusions None reported or observed  Perception WDL  Hallucination None reported or observed  Judgment Poor  Confusion None  Danger to Self  Current suicidal ideation? Denies  Self-Injurious Behavior No self-injurious ideation or behavior indicators observed or expressed   Agreement Not to Harm Self Yes  Description of Agreement verbal  Danger to Others  Danger to Others None reported or observed

## 2022-09-26 NOTE — [facility] Group Notes (Signed)
Pt attended AA group 

## 2022-09-26 NOTE — Progress Notes (Signed)
   09/26/22 0800  Psych Admission Type (Psych Patients Only)  Admission Status Voluntary  Psychosocial Assessment  Patient Complaints Anxiety  Eye Contact Fair  Facial Expression Animated  Affect Appropriate to circumstance  Speech Logical/coherent  Interaction Assertive  Motor Activity Fidgety  Appearance/Hygiene Unremarkable  Behavior Characteristics Appropriate to situation  Mood Anxious;Pleasant  Aggressive Behavior  Effect No apparent injury  Thought Process  Coherency WDL  Content WDL  Delusions None reported or observed  Perception WDL  Hallucination None reported or observed  Judgment Poor  Confusion None  Danger to Self  Current suicidal ideation? Denies  Self-Injurious Behavior No self-injurious ideation or behavior indicators observed or expressed   Agreement Not to Harm Self Yes  Description of Agreement Verbal  Danger to Others  Danger to Others None reported or observed

## 2022-09-27 MED ORDER — ARIPIPRAZOLE 2 MG PO TABS: 2.0000 mg | ORAL_TABLET | Freq: Every day | ORAL | 0 refills | Status: DC

## 2022-09-27 MED ORDER — NALTREXONE HCL 50 MG PO TABS: 25.0000 mg | ORAL_TABLET | Freq: Every day | ORAL | 0 refills | Status: AC

## 2022-09-27 MED ORDER — ADULT MULTIVITAMIN W/MINERALS CH: 1.0000 | ORAL_TABLET | Freq: Every day | ORAL | Status: AC

## 2022-09-27 MED ORDER — PROPRANOLOL HCL 10 MG PO TABS: 10.0000 mg | ORAL_TABLET | Freq: Two times a day (BID) | ORAL | 0 refills | Status: DC

## 2022-09-27 MED ORDER — DULOXETINE HCL 60 MG PO CPEP: 60.0000 mg | ORAL_CAPSULE | Freq: Every day | ORAL | 0 refills | Status: DC

## 2022-09-27 MED ORDER — HYDROXYZINE HCL 10 MG PO TABS: 10.0000 mg | ORAL_TABLET | Freq: Three times a day (TID) | ORAL | 0 refills | Status: DC | PRN

## 2022-09-27 MED ORDER — TRAZODONE HCL 50 MG PO TABS: 50.0000 mg | ORAL_TABLET | Freq: Every evening | ORAL | 0 refills | Status: DC | PRN

## 2022-09-27 MED ORDER — NALTREXONE HCL 50 MG PO TABS
25.0000 mg | ORAL_TABLET | Freq: Every day | ORAL | Status: DC
  Filled 2022-09-27 (×2): qty 1

## 2022-09-27 NOTE — Discharge Instructions (Signed)

## 2022-09-27 NOTE — Group Note (Signed)
Date:  09/27/2022 Time:  12:06 PM  LCSW Group Therapy Note   Group Date: @GROUPDATE @ Start Time: @GROUPSTARTTIME @ End Time: @GROUPENDTIME @   Type of Therapy and Topic:  Group Therapy: Boundaries  Participation Level:    Description of Group: This group will address the use of boundaries in their personal lives. Patients will explore why boundaries are important, the difference between healthy and unhealthy boundaries, and negative and postive outcomes of different boundaries and will look at how boundaries can be crossed.  Patients will be encouraged to identify current boundaries in their own lives and identify what kind of boundary is being set. Facilitators will guide patients in utilizing problem-solving interventions to address and correct types boundaries being used and to address when no boundary is being used. Understanding and applying boundaries will be explored and addressed for obtaining and maintaining a balanced life. Patients will be encouraged to explore ways to assertively make their boundaries and needs known to significant others in their lives, using other group members and facilitator for role play, support, and feedback.  Therapeutic Goals:  1.  Patient will identify areas in their life where setting clear boundaries could be  used to improve their life.  2.  Patient will identify signs/triggers that a boundary is not being respected. 3.  Patient will identify two ways to set boundaries in order to achieve balance in  their lives: 4.  Patient will demonstrate ability to communicate their needs and set boundaries  through discussion and/or role plays  Summary of Patient Progress:  Patient was active throughout the session and proved open to feedback from CSW and peers. Patient demonstrated insight into the subject matter, was respectful of peers, and was present throughout the entire session.  Therapeutic Modalities:   Cognitive Behavioral Therapy Solution-Focused  Therapy  Memory Dance Octivia Canion, LCSWA 09/27/2022  12:02 PM   Group Topic/Focus:  Emotional Education:   The focus of this group is to discuss what feelings/emotions are, and how they are experienced. Self Care:   The focus of this group is to help patients understand the importance of self-care in order to improve or restore emotional, physical, spiritual, interpersonal, and financial health.    Participation Level:  Did Not Attend  Participation Quality:    Affect:    Cognitive:    Insight:   Engagement in Group:    Modes of Intervention:    Additional Comments:    Memory Dance Macedonio Scallon 09/27/2022, 12:06 PM

## 2022-09-27 NOTE — Plan of Care (Signed)

## 2022-09-27 NOTE — Discharge Summary (Signed)
Physician Discharge Summary Note  Patient:  Christina Hull is an 32 y.o., female MRN:  [id number] DOB:  [birth year] Patient phone:  [phone] (home)  Patient address:   [address] [place] [address],  Total Time spent with patient: 30 minutes  Date of Admission:  09/22/2022 Date of Discharge:   09/27/2022  Reason for Admission:  This is the second psychiatric admission/evaluation in this [facility] for this 32 year old Caucasian female with hx of mental illness (major depressive disorder) & probable alcohol use disorder. Patient was a patient in this [facility] last January, 2024 with similar complaint. She was receiving mental health care/medication management on an outpatient basis at the [facility] in this [place], [place]. She is admitted to the [facility] this time around with complaint of worsening symptoms of depression, chronic suicidal ideations & several plans to hurt herself that worsened in the last one week. Patient went to the [facility] first with her husband & after evaluation, was transferred to the [facility] for further psychiatric evaluation/treatments. A review of her current lab results seem stable. Her toxicology results has shown a BAL of 68.   Principal Problem: MDD (major depressive disorder), recurrent severe, without psychosis (HCC) Discharge Diagnoses: Principal Problem:   MDD (major depressive disorder), recurrent severe, without psychosis (HCC) Active Problems:   Alcohol use disorder   GAD (generalized anxiety disorder)  Past Psychiatric History: (Per chart review): Previous Psych Diagnoses: Major depressive disorder, recurrent severe, without psychosis, anxiety, suicidal ideation Prior inpatient treatment: Patient denies Current/prior outpatient [facility]: [facility]  Patient started 04/05/2022 Prior rehab hx: Patient denies Psychotherapy hx: Patient denies History of suicide: Patient denies History of homicide or aggression:  Denies Psychiatric medication history: Yes, see above Psychiatric medication compliance history: No, occasionally skip doses Neuromodulation history: Patient denies Current Psychiatrist: Yes, patient does not remember the name of the psychiatrist at the [facility] Current therapist: Yes, [facility]  Past Medical History:  Past Medical History:  Diagnosis Date   PCOS (polycystic ovarian syndrome)    History reviewed. No pertinent surgical history. Family History:  Family History  Problem Relation Age of Onset   Cancer Other    Heart disease Other    Depression Mother    Cancer Mother    COPD Mother    Arthritis Mother    Bipolar disorder Mother    Cancer Paternal Grandmother    Family Psychiatric  History: See H&P  Social History:  Social History   Substance and Sexual Activity  Alcohol Use None     Social History   Substance and Sexual Activity  Drug Use Not on file    Social History   Socioeconomic History   Marital status: Married    Spouse name: Not on file   Number of children: Not on file   Years of education: Not on file   Highest education level: Not on file  Occupational History   Not on file  Tobacco Use   Smoking status: Former    Types: Cigarettes    Passive exposure: Past   Smokeless tobacco: Never  Substance and Sexual Activity   Alcohol use: Not on file   Drug use: Not on file   Sexual activity: Not on file  Other Topics Concern   Not on file  Social History Narrative   ** Merged History Encounter **       Social Determinants of Health   Financial  Resource Strain: Not on file  Food Insecurity: No Food Insecurity (09/22/2022)   Hunger Vital Sign    Worried About Running Out of Food in the Last Year: Never true    Ran Out of Food in the Last Year: Never true  Transportation Needs: No Transportation Needs (09/22/2022)   PRAPARE - Administrator, Civil Service (Medical): No    Lack of  Transportation (Non-Medical): No  Physical Activity: Not on file  Stress: Not on file  Social Connections: Unknown (07/26/2021)   Received from [facility]   Social Network    Social Network: Not on file    Hospital Course:  During the patient's hospitalization, patient had extensive initial psychiatric evaluation, and follow-up psychiatric evaluations every day.  Psychiatric diagnoses provided upon initial assessment:   MDD (major depressive disorder), recurrent severe, without psychosis (HCC) Active Problems:   Alcohol use disorder  Patient's psychiatric medications were adjusted on admission:  -Initiated Abilify 2 mg po daily for antidepressant augmentation.  -Continue Duloxetine 60 mg po daily for depression.  -Continue Hydroxyzine 25 mg po tid prn for anxiety.  -Continue Trazodone 50 mg po Q has prn for insomnia.  -Continue Lorazepam 1 mg po Q 6 hrs prn for CIWA > 10 x 72 hours.   During the hospitalization, other adjustments were made to the patient's psychiatric medication regimen:  None  Patient's care was discussed during the interdisciplinary team meeting every day during the hospitalization.  The patient denies having side effects to prescribed psychiatric medication.  Gradually, patient started adjusting to milieu. The patient was evaluated each day by a clinical provider to ascertain response to treatment. Improvement was noted by the patient's report of decreasing symptoms, improved sleep and appetite, affect, medication tolerance, behavior, and participation in unit programming.  Patient was asked each day to complete a self inventory noting mood, mental status, pain, new symptoms, anxiety and concerns.    Symptoms were reported as significantly decreased or resolved completely by discharge.   On day of discharge, the patient reports that their mood is stable. The patient denied having suicidal thoughts for more than 48 hours prior to discharge.  Patient denies  having homicidal thoughts.  Patient denies having auditory hallucinations.  Patient denies any visual hallucinations or other symptoms of psychosis. The patient was motivated to continue taking medication with a goal of continued improvement in mental health.   The patient reports their target psychiatric symptoms of depression responded well to the psychiatric medications, and the patient reports overall benefit other psychiatric hospitalization. Supportive psychotherapy was provided to the patient. The patient also participated in regular group therapy while hospitalized. Coping skills, problem solving as well as relaxation therapies were also part of the unit programming.  Labs were reviewed with the patient, and abnormal results were discussed with the patient.  The patient is able to verbalize their individual safety plan to this provider.  # It is recommended to the patient to continue psychiatric medications as prescribed, after discharge from the hospital.    # It is recommended to the patient to follow up with your outpatient psychiatric provider and PCP.  # It was discussed with the patient, the impact of alcohol, drugs, tobacco have been there overall psychiatric and medical wellbeing, and total abstinence from substance use was recommended the patient.ed.  # Prescriptions provided or sent directly to preferred pharmacy at discharge. Patient agreeable to plan. Given opportunity to ask questions. Appears to feel comfortable with discharge.    #  In the event of worsening symptoms, the patient is instructed to call the crisis hotline, 911 and or go to the nearest ED for appropriate evaluation and treatment of symptoms. To follow-up with primary care provider for other medical issues, concerns and or health care needs  # Patient was discharged to home with a plan to follow up as noted below.  Physical Findings: AIMS:  , ,  ,  ,    CIWA:  CIWA-Ar Total: 0 COWS:      Musculoskeletal: Strength & Muscle Tone: within normal limits Gait & Station: normal Patient leans: N/A  Psychiatric Specialty Exam:  Presentation  General Appearance:  Appropriate for Environment; Casual; Fairly Groomed  Eye Contact: Good  Speech: Normal Rate; Clear and Coherent  Speech Volume: Normal  Handedness: Right  Mood and Affect  Mood: Euthymic  Affect: Appropriate; Congruent; Full Range  Thought Process  Thought Processes: Linear  Descriptions of Associations:Intact  Orientation:Full (Time, Place and Person)  Thought Content:Logical  History of Schizophrenia/Schizoaffective disorder:No  Duration of Psychotic Symptoms:No data recorded Hallucinations:Hallucinations: None  Ideas of Reference:None  Suicidal Thoughts:Suicidal Thoughts: Yes, Passive (reports having chronic SI at baseline, daily, w/o any intent or plan) SI Active Intent and/or Plan: Without Intent; Without Plan SI Passive Intent and/or Plan: Without Intent; Without Plan; Without Means to Carry Out  Homicidal Thoughts:Homicidal Thoughts: No  Sensorium  Memory: Immediate Good; Recent Good; Remote Good  Judgment: Fair  Insight: Fair  Art therapist  Concentration: Fair  Attention Span: Fair  Recall: Good  Fund of Knowledge: Good  Language: Good  Psychomotor Activity  Psychomotor Activity: Psychomotor Activity: Normal  Assets  Assets: Communication Skills; Desire for Improvement; Housing; Physical Health; Resilience; Social Support  Sleep  Sleep: Sleep: Fair Number of Hours of Sleep: 8  Physical Exam: Physical Exam Vitals and nursing note reviewed.  Constitutional:      Appearance: Normal appearance. She is obese.  HENT:     Head: Normocephalic.     Nose: Nose normal.     Mouth/Throat:     Mouth: Mucous membranes are moist.     Pharynx: Oropharynx is clear.  Eyes:     Extraocular Movements: Extraocular movements intact.     Pupils: Pupils  are equal, round, and reactive to light.  Cardiovascular:     Rate and Rhythm: Normal rate.     Pulses: Normal pulses.  Pulmonary:     Effort: Pulmonary effort is normal.  Abdominal:     Comments: Deferred  Genitourinary:    Comments: Deferred Musculoskeletal:        General: Normal range of motion.     Cervical back: Normal range of motion.  Skin:    General: Skin is warm.  Neurological:     General: No focal deficit present.     Mental Status: She is alert and oriented to person, place, and time.  Psychiatric:        Mood and Affect: Mood normal.        Behavior: Behavior normal.        Thought Content: Thought content normal.    Review of Systems  Constitutional:  Negative for chills and fever.  HENT:  Negative for sore throat.   Eyes: Negative.   Respiratory:  Negative for cough, shortness of breath and wheezing.   Cardiovascular:  Negative for chest pain and palpitations.  Gastrointestinal: Negative.   Genitourinary: Negative.   Musculoskeletal: Negative.   Skin:  Negative for itching and rash.  Neurological:  Negative for dizziness, tingling and headaches.  Endo/Heme/Allergies:        See allergy listing  Psychiatric/Behavioral:  Positive for depression (Stable with medication). The patient is nervous/anxious (Improved with medication) and has insomnia (Improved with medication).    Blood pressure 124/83, pulse 80, temperature 98.3 F (36.8 C), temperature source Oral, resp. rate 16, height 5\' 2"  (1.575 m), weight 97.5 kg, SpO2 99%. Body mass index is 39.32 kg/m.  Social History   Tobacco Use  Smoking Status Former   Types: Cigarettes   Passive exposure: Past  Smokeless Tobacco Never   Tobacco Cessation:  N/A, patient does not currently use tobacco products  Blood Alcohol level:  Lab Results  Component Value Date   ETH 68 (H) 09/21/2022   ETH <10 04/12/2022    Metabolic Disorder Labs:  Lab Results  Component Value Date   HGBA1C 4.8 04/12/2022    MPG 91.06 04/12/2022   Lab Results  Component Value Date   PROLACTIN 10.9 04/12/2022   Lab Results  Component Value Date   CHOL 198 04/12/2022   TRIG 98 04/12/2022   HDL 70 04/12/2022   CHOLHDL 2.8 04/12/2022   VLDL 20 04/12/2022   LDLCALC 108 (H) 04/12/2022   LDLCALC 87 02/08/2022    See Psychiatric Specialty Exam and Suicide Risk Assessment completed by Attending Physician prior to discharge.  Discharge destination:  Home  Is patient on multiple antipsychotic therapies at discharge:  No   Has Patient had three or more failed trials of antipsychotic monotherapy by history:  No  Recommended Plan for Multiple Antipsychotic Therapies: NA  Discharge Instructions     Diet - low sodium heart healthy   Complete by: As directed    Increase activity slowly   Complete by: As directed       Allergies as of 09/27/2022   No Known Allergies      Medication List     TAKE these medications      Indication  ARIPiprazole 2 MG tablet Commonly known as: ABILIFY Take 1 tablet (2 mg total) by mouth daily. Start taking on: September 28, 2022  Indication: Major Depressive Disorder, Antidepressant augmentation.   DULoxetine 60 MG capsule Commonly known as: CYMBALTA Take 1 capsule (60 mg total) by mouth daily.  Indication: Major Depressive Disorder   Humira (2 Pen) 40 MG/0.4ML pen Generic drug: adalimumab Inject 40 mg into the skin every 14 (fourteen) days.  Indication: home med   hydrOXYzine 10 MG tablet Commonly known as: ATARAX Take 1 tablet (10 mg total) by mouth 3 (three) times daily as needed for anxiety. What changed:  medication strength how much to take  Indication: Feeling Anxious   ibuprofen 200 MG tablet Commonly known as: ADVIL Take 400 mg by mouth every 6 (six) hours as needed (For chronic pain).  Indication: Headache   multivitamin with minerals Tabs tablet Take 1 tablet by mouth daily. Start taking on: September 28, 2022  Indication: multivitamin    naltrexone 50 MG tablet Commonly known as: DEPADE Take 0.5 tablets (25 mg total) by mouth daily. Start taking on: September 28, 2022  Indication: Abuse or Misuse of Alcohol   propranolol 10 MG tablet Commonly known as: INDERAL Take 1 tablet (10 mg total) by mouth every 12 (twelve) hours.  Indication: Feeling Anxious   tiZANidine 4 MG tablet Commonly known as: Zanaflex Take 1 tablet (4 mg total) by mouth every 6 (six) hours as needed for muscle spasms. What changed: when to take this  Indication: Musculoskeletal Pain   traZODone 50 MG tablet Commonly known as: DESYREL Take 1 tablet (50 mg total) by mouth at bedtime as needed for sleep.  Indication: Trouble Sleeping        Follow-up Information     [facility] Follow up on 10/04/2022.   Why: You have an appointment for medication management services on 10/04/2022 at 11:15 am.  This appointment will be in person.   *A referral has been made to this provider for DBT therapy services. Contact information: [address]  [place], [place] [address]  P: [phone] F: [phone]        [facility]. Go on 09/29/2022.   Specialty: Behavioral Health Why: You have an appointment on 09/29/22 at 2:00 pm for SAIOP (substance abuse intensive outpatient therapy services).  This appt will be held in person.  * YOU MUST CALL TO CONFIRM TO KEEP THIS APPT. Contact information: [address] [place] [address] [phone]                Follow-up recommendations:   Discharge Recommendations:  The patient is being discharged with her family. Patient is to take her discharge medications as ordered.  See follow up above. We recommend that she participates in individual therapy to target uncontrollable agitation and substance abuse.  We recommend that she participates in family therapy to target the conflict with her family, to improve communication skills and conflict resolution skills.   Family is to initiate/implement a contingency based behavioral model to address patient's behavior. We recommend that she get AIMS scale, height, weight, blood pressure, fasting lipid panel, fasting blood sugar in three months from discharge if she's on atypical antipsychotics.  Patient will benefit from monitoring of recurrent suicidal ideation since patient is on antidepressant medication. The patient should abstain from all illicit substances and alcohol. If the patient's symptoms worsen or do not continue to improve or if the patient becomes actively suicidal or homicidal then it is recommended that the patient return to the closest hospital emergency room or call 911 for further evaluation and treatment. National Suicide Prevention Lifeline 1800-SUICIDE or 445-728-3944. Please follow up with your primary medical doctor for all other medical needs.  The patient has been educated on the possible side effects to medications and she/her guardian is to contact a medical professional and inform outpatient provider of any new side effects of medication. She is to take regular diet and activity as tolerated.  Will benefit from moderate daily exercise. Family was educated about removing/locking any firearms, medications or dangerous products from the home.  Activity:  As tolerated Diet:  Regular Diet Signed: Cecilie Lowers, FNP 09/27/2022, 10:23 AM

## 2022-09-27 NOTE — Group Note (Signed)
Date:  09/27/2022 Time:  10:33 AM  Group Topic/Focus:  Goals Group:   The focus of this group is to help patients establish daily goals to achieve during treatment and discuss how the patient can incorporate goal setting into their daily lives to aide in recovery.    Participation Level:  Active  Participation Quality:  Appropriate  Affect:  Appropriate  Cognitive:  Appropriate  Insight: Appropriate  Engagement in Group:  Engaged  Modes of Intervention:  Discussion  Additional Comments:     Reymundo Poll 09/27/2022, 10:33 AM

## 2022-09-27 NOTE — [facility] Suicide Risk Assessment (Signed)
Suicide Risk Assessment  Discharge Assessment    [facility] Discharge Suicide Risk Assessment   Principal Problem: MDD (major depressive disorder), recurrent severe, without psychosis (HCC) Discharge Diagnoses: Principal Problem:   MDD (major depressive disorder), recurrent severe, without psychosis (HCC) Active Problems:   Alcohol use disorder   GAD (generalized anxiety disorder)  Reason for admission:  This is the second psychiatric admission/evaluation in this [facility] for this 32 year old Caucasian female with hx of mental illness (major depressive disorder) & probable alcohol use disorder. Patient was a patient in this [facility] last January, 2024 with similar complaint. She was receiving mental health care/medication management [facility] an [facility] basis [facility] in this [place], [place]. She is admitted to the [facility] this time around with complaint of worsening symptoms of depression, chronic suicidal ideations & several plans to hurt herself that worsened in the last one week. Patient went to the [facility] first with her husband & after evaluation, was transferred to the [facility] for further psychiatric evaluation/treatments. A review of her current lab results seem stable. Her toxicology results has shown a BAL of 68.   Total Time spent with patient: 30 minutes  Musculoskeletal: Strength & Muscle Tone: within normal limits Gait & Station: normal Patient leans: N/A  Psychiatric Specialty Exam  Presentation  General Appearance:  Appropriate for Environment; Casual; Fairly Groomed  Eye Contact: Good  Speech: Normal Rate; Clear and Coherent  Speech Volume: Normal  Handedness: Right  Mood and Affect  Mood: Euthymic  Duration of Depression Symptoms: Greater than two weeks  Affect: Appropriate; Congruent; Full Range  Thought Process  Thought Processes: Linear  Descriptions of Associations:Intact  Orientation:Full (Time, Place and Person)  Thought  Content:Logical  History of Schizophrenia/Schizoaffective disorder:No  Duration of Psychotic Symptoms:No data recorded Hallucinations:Hallucinations: None  Ideas of Reference:None  Suicidal Thoughts:Suicidal Thoughts: Yes, Passive (reports having chronic SI at baseline, daily, w/o any intent or plan) SI Active Intent and/or Plan: Without Intent; Without Plan SI Passive Intent and/or Plan: Without Intent; Without Plan; Without Means to Carry Out  Homicidal Thoughts:Homicidal Thoughts: No  Sensorium  Memory: Immediate Good; Recent Good; Remote Good  Judgment: Fair  Insight: Fair  Art therapist  Concentration: Fair  Attention Span: Fair  Recall: Good  Fund of Knowledge: Good  Language: Good  Psychomotor Activity  Psychomotor Activity: Psychomotor Activity: Normal  Assets  Assets: Communication Skills; Desire for Improvement; Housing; Physical Health; Resilience; Social Support  Sleep  Sleep: Sleep: Fair Number of Hours of Sleep: 8  Physical Exam: Physical Exam Vitals and nursing note reviewed.  HENT:     Head: Normocephalic.     Nose: Nose normal.     Mouth/Throat:     Mouth: Mucous membranes are moist.     Pharynx: Oropharynx is clear.  Eyes:     Extraocular Movements: Extraocular movements intact.     Pupils: Pupils are equal, round, and reactive to light.  Cardiovascular:     Rate and Rhythm: Normal rate.     Pulses: Normal pulses.  Pulmonary:     Effort: Pulmonary effort is normal.  Abdominal:     Comments: Deferred  Genitourinary:    Comments: Deferred Musculoskeletal:        General: Normal range of motion.     Cervical back: Normal range of motion.  Skin:    General: Skin is warm.  Neurological:     General: No focal deficit present.     Mental Status: She is alert  and oriented to person, place, and time.  Psychiatric:        Mood and Affect: Mood normal.        Behavior: Behavior normal.        Thought Content: Thought  content normal.    Review of Systems  Constitutional:  Negative for chills and fever.  HENT:  Negative for sore throat.   Eyes: Negative.   Respiratory:  Negative for cough, shortness of breath and wheezing.   Cardiovascular:  Negative for chest pain and palpitations.  Gastrointestinal:  Negative for abdominal pain, heartburn, nausea and vomiting.  Genitourinary: Negative.   Musculoskeletal: Negative.   Skin:  Negative for itching and rash.  Neurological:  Negative for dizziness, tingling, tremors, sensory change and headaches.  Endo/Heme/Allergies:        See allergy listing  Psychiatric/Behavioral:  Positive for depression (Stable with medication). The patient is nervous/anxious (Stable with medication) and has insomnia (Stable with medication).    Blood pressure 124/83, pulse 80, temperature 98.3 F (36.8 C), temperature source Oral, resp. rate 16, height 5\' 2"  (1.575 m), weight 97.5 kg, SpO2 99%. Body mass index is 39.32 kg/m.  Mental Status Per Nursing Assessment::   On Admission:  Suicide plan  Demographic Factors:  Adolescent or young adult, Caucasian, and Unemployed  Loss Factors: Financial problems/change in socioeconomic status  Historical Factors: Family history of suicide, Family history of mental illness or substance abuse, and Impulsivity  Risk Reduction Factors:   Positive social support, Positive therapeutic relationship, and Positive coping skills or problem solving skills  Continued Clinical Symptoms:  Depression:   Impulsivity Insomnia Recent sense of peace/wellbeing Alcohol/Substance Abuse/Dependencies More than one psychiatric diagnosis Previous Psychiatric Diagnoses and Treatments Medical Diagnoses and Treatments/Surgeries  Cognitive Features That Contribute To Risk:  Polarized thinking    Suicide Risk:  Mild:  Suicidal ideation of limited frequency, intensity, duration, and specificity.  There are no identifiable plans, no associated intent,  mild dysphoria and related symptoms, good self-control (both objective and subjective assessment), few other risk factors, and identifiable protective factors, including available and accessible social support.   Follow-up Information     [facility] Follow [facility] on 10/04/2022.   Why: You have an appointment for medication management services on 10/04/2022 at 11:15 am.  This appointment will be in person.   *A referral has been made to this provider for DBT therapy services. Contact information: [address]  [place], [place] [address]  P: [phone] F: [phone]        [facility]. Go on 09/29/2022.   Specialty: Behavioral Health Why: You have an appointment on 09/29/22 at 2:00 pm for SAIOP (substance abuse intensive outpatient therapy services).  This appt will be held in person.  * YOU MUST CALL TO CONFIRM TO KEEP THIS APPT. Contact information: [address] [place] [address] [phone]                 Plan Of Care/Follow-up recommendations:  Discharge Recommendations:  The patient is being discharged with her family. Patient is to take her discharge medications as ordered.  See follow up above. We recommend that she participates in individual therapy to target uncontrollable agitation and substance abuse.  We recommend that she participates in family therapy to target the conflict with her family, to improve communication skills and conflict resolution skills.  Family is to initiate/implement a contingency based behavioral model to address patient's behavior. We recommend that she get AIMS  scale, height, weight, blood pressure, fasting lipid panel, fasting blood sugar in three months from discharge if she's on atypical antipsychotics.  Patient will benefit from monitoring of recurrent suicidal ideation since patient is on antidepressant medication. The patient should abstain from all illicit substances and alcohol. If  the patient's symptoms worsen or do not continue to improve or if the patient becomes actively suicidal or homicidal then it is recommended that the patient return to the closest hospital emergency room or call 911 for further evaluation and treatment. National Suicide Prevention Lifeline 1800-SUICIDE or [phone]) 269-9199. Please follow up with your primary medical doctor for all other medical needs.  The patient has been educated on the possible side effects to medications and she/her guardian is to contact a medical professional and inform outpatient provider of any new side effects of medication. She is to take regular diet and activity as tolerated.  Will benefit from moderate daily exercise. Family was educated about removing/locking any firearms, medications or dangerous products from the home.  Activity:  As tolerated Diet:  Regular Diet  Cecilie Lowers, FNP 09/27/2022, 10:05 AM

## 2022-09-27 NOTE — Group Note (Signed)
Recreation Therapy Group Note   Group Topic:Animal Assisted Therapy   Group Date: 09/27/2022 Start Time: 1008 End Time: 1042 Facilitators: Timia Casselman-McCall, LRT,CTRS Location: [address] Dayroom   Animal-Assisted Activity (AAA) Program Checklist/Progress Notes Patient Eligibility Criteria Checklist & Daily Group note for Rec Tx Intervention  AAA/T Program Assumption of Risk Form signed by Patient/ or Parent Legal Guardian Yes  Patient is free of allergies or severe asthma Yes  Patient reports no fear of animals Yes  Patient reports no history of cruelty to animals Yes  Patient understands his/her participation is voluntary Yes  Patient washes hands before animal contact Yes  Patient washes hands after animal contact Yes   Affect/Mood: Appropriate   Participation Level: Engaged   Participation Quality: Independent   Behavior: Appropriate   Speech/Thought Process: Focused   Insight: Good   Judgement: Good   Modes of Intervention: Teaching laboratory technician   Patient Response to Interventions:  Engaged   Education Outcome:  Acknowledges education   Clinical Observations/Individualized Feedback:  Patient attended session and interacted appropriately with therapy dog and peers. Patient asked appropriate questions about therapy dog and his training. Patient shared stories about their pets at home with group.    Plan: Continue to engage patient in RT group sessions 2-3x/week.   Christina Hull, LRT,CTRS 09/27/2022 12:31 PM

## 2022-09-27 NOTE — Progress Notes (Signed)
Christina Hull  D/C'd Home per MD order.  Discussed with the patient and all questions fully answered.   An After Visit Summary was printed and given to the patient. Patient received prescription.  D/c education completed with patient including follow up instructions, medication list, d/c activities limitations if indicated, with other d/c instructions as indicated by MD - patient able to verbalize understanding, all questions fully answered.   Patient instructed to return to ED, call 911, or call MD for any changes in condition. Patient has a chronic suicidal ideation, which she stated, the feeling of suicidal ideation was a lot better at the time of discharge.  Patient escorted to the main entrance, and D/C home via private auto.  [name] 09/27/2022 12:25 PM

## 2022-09-27 NOTE — Progress Notes (Signed)
  [facility] Adult Case Management Discharge Plan :  Will you be returning to the same living situation after discharge:  Christina Hull, Christina Hull [phone] At discharge, do you have transportation home?: Yes,  Christina Hull [phone] Do you have the ability to pay for your medications: Yes,  Christina Hull, Christina Hull  Release of information consent forms completed and in the chart;  Patient's signature needed at discharge.  Patient to Follow up at:  Follow-up Information     [facility] Follow up on 10/04/2022.   Why: You have an appointment for medication management services on 10/04/2022 at 11:15 am.  This appointment will be in person.   *A referral has been made to this provider for DBT therapy services. Contact information: [address]  [place], [place] [address]  P: [phone] F: [phone]        [facility]. Go on 09/29/2022.   Specialty: Behavioral Health Why: You have an appointment on 09/29/22 at 2:00 pm for SAIOP (substance abuse intensive outpatient therapy services).  This appt will be held in person.  * YOU MUST CALL TO CONFIRM TO KEEP THIS APPT. Contact information: [address] [place] [address] [phone]                 Next level of care provider has access to [facility] Link:no  Safety Planning and Suicide Prevention discussed: Christina Hull  Christina Hull [phone]     Has patient been referred to the Quitline?: Patient refused referral for treatment  Patient has been referred for addiction treatment: Yes, the patient will follow up with an outpatient provider for substance use disorder. Psychiatrist/APP: appointment made and Therapist: appointment made Patient to continue working towards treatment goals after discharge. Patient no longer meets criteria for inpatient criteria per attending physician. Continue taking medications as prescribed, nursing to provide instructions at discharge. Follow up with all  scheduled appointments.   Christina Buckwalter S Aften Lipsey, LCSW 09/27/2022, 9:13 AM

## 2022-09-27 NOTE — Plan of Care (Signed)
  Problem: Education: Goal: Knowledge of [facility] Education information/materials will improve Outcome: Completed/Met Goal: Emotional status will improve Outcome: Completed/Met Goal: Mental status will improve Outcome: Completed/Met Goal: Verbalization of understanding the information provided will improve Outcome: Completed/Met   Problem: Activity: Goal: Interest or engagement in activities will improve Outcome: Completed/Met Goal: Sleeping patterns will improve Outcome: Completed/Met   

## 2022-09-28 NOTE — Progress Notes (Signed)
Chaplain invited Christina Hull to meet individually after grief and loss group to talk more about the loss of her sister.  She shared that her sister died of an overdose and that she felt guilty that she attempted to take her own life and didn't want to leave her brother alone. Chaplain provided listening and support as she shared about family dynamics and the support of her husband.  She mentioned the grief of not being able to have children as well.    [address] B[facility] Pager, [phone]

## 2022-11-12 ENCOUNTER — Other Ambulatory Visit: Payer: Self-pay | Admitting: Family Medicine

## 2022-12-04 ENCOUNTER — Ambulatory Visit (HOSPITAL_COMMUNITY)
Admission: EM | Admit: 2022-12-04 | Discharge: 2022-12-05 | Disposition: A | Discharge status: Other Acute Inpatient Hospital | Payer: 59 | Attending: Psychiatry | Admitting: Psychiatry

## 2022-12-04 DIAGNOSIS — Z9151 Personal history of suicidal behavior: Secondary | ICD-10-CM | POA: Insufficient documentation

## 2022-12-04 DIAGNOSIS — F411 Generalized anxiety disorder: Secondary | ICD-10-CM | POA: Diagnosis not present

## 2022-12-04 DIAGNOSIS — F329 Major depressive disorder, single episode, unspecified: Secondary | ICD-10-CM | POA: Diagnosis present

## 2022-12-04 DIAGNOSIS — F332 Major depressive disorder, recurrent severe without psychotic features: Secondary | ICD-10-CM | POA: Diagnosis not present

## 2022-12-04 DIAGNOSIS — R45851 Suicidal ideations: Secondary | ICD-10-CM | POA: Insufficient documentation

## 2022-12-04 DIAGNOSIS — F1011 Alcohol abuse, in remission: Secondary | ICD-10-CM | POA: Diagnosis not present

## 2022-12-04 DIAGNOSIS — Z79899 Other long term (current) drug therapy: Secondary | ICD-10-CM | POA: Diagnosis not present

## 2022-12-04 LAB — POCT URINE DRUG SCREEN - MANUAL ENTRY (I-SCREEN)
POC Amphetamine UR: NOT DETECTED
POC Buprenorphine (BUP): NOT DETECTED
POC Cocaine UR: NOT DETECTED
POC Marijuana UR: NOT DETECTED
POC Methadone UR: NOT DETECTED
POC Methamphetamine UR: NOT DETECTED
POC Morphine: NOT DETECTED
POC Oxazepam (BZO): NOT DETECTED
POC Oxycodone UR: NOT DETECTED
POC Secobarbital (BAR): NOT DETECTED

## 2022-12-04 LAB — POC URINE PREG, ED: Preg Test, Ur: NEGATIVE

## 2022-12-04 MED ORDER — ALUM & MAG HYDROXIDE-SIMETH 200-200-20 MG/5ML PO SUSP: 30.0000 mL | ORAL | Status: DC | PRN

## 2022-12-04 MED ORDER — ARIPIPRAZOLE 2 MG PO TABS
2.0000 mg | ORAL_TABLET | Freq: Every day | ORAL | Status: DC
  Filled 2022-12-04: qty 1

## 2022-12-04 MED ORDER — TRAZODONE HCL 50 MG PO TABS
50.0000 mg | ORAL_TABLET | Freq: Every evening | ORAL | Status: DC | PRN
  Administered 2022-12-04: 50 mg via ORAL
  Filled 2022-12-04: qty 1

## 2022-12-04 MED ORDER — ADULT MULTIVITAMIN W/MINERALS CH
1.0000 | ORAL_TABLET | Freq: Every day | ORAL | Status: DC
  Administered 2022-12-05: 1 via ORAL
  Filled 2022-12-04: qty 1

## 2022-12-04 MED ORDER — ACETAMINOPHEN 325 MG PO TABS: 650.0000 mg | ORAL_TABLET | Freq: Four times a day (QID) | ORAL | Status: DC | PRN

## 2022-12-04 MED ORDER — MAGNESIUM HYDROXIDE 400 MG/5ML PO SUSP: 30.0000 mL | Freq: Every day | ORAL | Status: DC | PRN

## 2022-12-04 MED ORDER — HYDROXYZINE HCL 25 MG PO TABS
25.0000 mg | ORAL_TABLET | Freq: Three times a day (TID) | ORAL | Status: DC | PRN
  Filled 2022-12-04: qty 1

## 2022-12-04 NOTE — Progress Notes (Signed)
   12/04/22 2130  [facility] Triage Screening (Walk-ins at [facility] only)  How Did You Hear About [place]? Family/Friend  What Is the Reason for Your Visit/Call Today? Christina Hull is a 32 year old female presenting as a voluntary walk-in to [facility] due to [facility] with a plan. Patient denied HI, psychosis and alcohol/drug usage. Patient reports SI since middle school. Patient reports SI with plan worsened within the past week. Patient reported SI with plan to go to fathers house and shoot self with gun, crash car off the road or to drive to [facility] and jump off. Patient is [facility] by [facility] for [facility] and medication management. Patient is unable to contract for safety.  How Long Has This Been Causing You Problems? > than 6 months  Have You Recently Had Any Thoughts About Hurting Yourself? Yes  How long ago did you have thoughts about hurting yourself? SI since middle school, worsened in past week.  Are You Planning to Commit Suicide/Harm Yourself At This time? Yes  Have you Recently Had Thoughts About Hurting Someone [name]? No  Are You Planning To Harm Someone At This Time? No  Are you currently experiencing any auditory, visual or other hallucinations? No  Have You Used Any Alcohol or Drugs in the Past 24 Hours? No  Do you have any current medical co-morbidities that require immediate attention? No  Clinician description of patient physical appearance/behavior: neat / cooperative  What Do You Feel Would Help You the Most Today? Treatment for Depression or other mood problem  If access to [facility] was not available, would you have sought care in the Emergency Department? Yes  Determination of Need Urgent (48 hours)  Options For Referral [facility];Medication Management;Outpatient Therapy    Flowsheet Row ED from 12/04/2022 in [facility] Admission (Discharged) [facility]  ED from 09/21/2022 in [facility]  C-SSRS RISK CATEGORY High Risk High Risk High Risk

## 2022-12-04 NOTE — ED Provider Notes (Signed)
[facility] Continuous Assessment Admission H&P  Date: 12/05/22 Patient Name: Christina Hull MRN: [id number] Chief Complaint: " I am having suicidal thoughts"  Diagnoses:  Final diagnoses:  Severe episode of recurrent major depressive disorder, without psychotic features [facility])    HPI: Christina Hull is a 32 year old female with psychiatric history of major depressive disorder and anxiety, suicidal ideation, and alcohol use disorder (currently 60 days sober).  Patient was accompanied to [facility] by her husband Casimiro Needle [phone]).  Patient gave verbal permission for her husband to remain present and participating in her assessment.   This nurse practitioner met with patient face-to-face and reviewed her chart. patient presented to [facility] reporting depressive symptoms and worsening suicidal ideation.  Patient endorses depressive symptoms of irritability, hopelessness, worthlessness, hypersomnia, poor focus, restlessness, and an increase in anxiety.  Patient reports increased suicidal ideation over the past 1 week; she says today she had thoughts of harming herself and had considered crashing her car, shooting herself on her father's or grandfather's gun, jumping [place] the [place] at [facility]. She is unable to identify any specific triggers however she report that she is unable to maintain her safety. She denies homicidal ideation, paranoia, and visual hallucination. She endorses 1 episode of auditory hallucination of "ghost sound" which occurred on Monday, her first day of returning to work.   Patient reported that she has outpatient [facility] and [facility]  She reported she is compliant with her medication. Current medications include: Propranolol 10 mg twice daily as needed, Naltrexone 50mg /day, duloxetine 90 mg/day, hydroxyzine 10 mg twice daily as needed, Abilify 2 mg twice daily, Tizanidine 4mg  prn at bedtime, multivitamin  Patient is  casually dressed, alert and oriented x4. Pt speaks in a clear tone, at moderate volume and normal pace. Motor behavior appears normal. Eye contact is good. Pt's mood is depressed, and affect is congruent with mood. Thought process is coherent and relevant. There is no indication she is currently responding to internal stimuli or experiencing delusional thought content. Pt is cooperative and requesting admission, stating she is unable to contract for safety if she is not in a facility.  Total Time spent with patient: 30 minutes  Musculoskeletal  Strength & Muscle Tone: within normal limits Gait & Station: normal Patient leans: Right  Psychiatric Specialty Exam  Presentation General Appearance:  Appropriate for Environment; Casual; Fairly Groomed  Eye Contact: Good  Speech: Normal Rate; Clear and Coherent  Speech Volume: Normal  Handedness: Right   Mood and Affect  Mood: Euthymic  Affect: Appropriate; Congruent; Full Range   Thought Process  Thought Processes: Linear  Descriptions of Associations:Intact  Orientation:Full (Time, Place and Person)  Thought Content:Logical  Diagnosis of Schizophrenia or Schizoaffective disorder in past: No   Hallucinations:No data recorded Ideas of Reference:None  Suicidal Thoughts:No data recorded Homicidal Thoughts:No data recorded  Sensorium  Memory: Immediate Good; Recent Good; Remote Good  Judgment: Fair  Insight: Fair   Art therapist  Concentration: Fair  Attention Span: Fair  Recall: Good  Fund of Knowledge: Good  Language: Good   Psychomotor Activity  Psychomotor Activity:No data recorded  Assets  Assets: Communication Skills; Desire for Improvement; Housing; Physical Health; Resilience; Social Support   Sleep  Sleep:No data recorded  No data recorded  Physical Exam Vitals reviewed.  Constitutional:      General: She is not in acute distress.    Appearance: She is  well-developed. She is not ill-appearing.  HENT:  Head: Normocephalic and atraumatic.  Eyes:     Conjunctiva/sclera: Conjunctivae normal.  Cardiovascular:     Rate and Rhythm: Normal rate.  Pulmonary:     Effort: Pulmonary effort is normal.  Musculoskeletal:        General: Normal range of motion.     Cervical back: Neck supple.  Neurological:     Mental Status: She is alert and oriented to person, place, and time.  Psychiatric:        Attention and Perception: Attention normal. She perceives auditory hallucinations.        Mood and Affect: Mood is anxious and depressed.        Speech: Speech normal.        Behavior: Behavior normal. Behavior is cooperative.        Thought Content: Thought content includes suicidal ideation. Thought content includes suicidal plan.    Review of Systems  Constitutional: Negative.   HENT: Negative.    Eyes: Negative.   Respiratory: Negative.    Cardiovascular: Negative.   Gastrointestinal: Negative.   Genitourinary: Negative.   Musculoskeletal: Negative.   Skin: Negative.   Neurological: Negative.   Endo/Heme/Allergies: Negative.   Psychiatric/Behavioral:  Positive for depression and suicidal ideas. Negative for substance abuse. The patient is nervous/anxious.     Blood pressure 131/84, pulse 83, temperature 98.1 F (36.7 C), temperature source Oral, resp. rate 18, SpO2 98%. There is no height or weight on file to calculate BMI.  Past Psychiatric History: Major depressive disorder, anxiety, suicidal ideation, and alcohol use disorder  Is the patient at risk to self? Yes  Has the patient been a risk to self in the past 6 months? Yes .    Has the patient been a risk to self within the distant past? Yes   Is the patient a risk to others? No   Has the patient been a risk to others in the past 6 months? No   Has the patient been a risk to others within the distant past? No   Past Medical History:  Past Medical History:  Diagnosis Date    PCOS (polycystic ovarian syndrome)      Family History:  Family History  Problem Relation Age of Onset   Cancer Other    Heart disease Other    Depression Mother    Cancer Mother    COPD Mother    Arthritis Mother    Bipolar disorder Mother    Cancer Paternal Grandmother      Social History:  Social History   Tobacco Use   Smoking status: Former    Types: Cigarettes    Passive exposure: Past   Smokeless tobacco: Never    Last Labs:  Admission on 12/04/2022  Component Date Value Ref Range Status   WBC 12/04/2022 8.4  4.0 - 10.5 K/uL Final   RBC 12/04/2022 4.60  3.87 - 5.11 MIL/uL Final   Hemoglobin 12/04/2022 14.8  12.0 - 15.0 g/dL Final   HCT 41/32/4401 42.8  36.0 - 46.0 % Final   MCV 12/04/2022 93.0  80.0 - 100.0 fL Final   MCH 12/04/2022 32.2  26.0 - 34.0 pg Final   MCHC 12/04/2022 34.6  30.0 - 36.0 g/dL Final   RDW 02/72/5366 12.5  11.5 - 15.5 % Final   Platelets 12/04/2022 262  150 - 400 K/uL Final   nRBC 12/04/2022 0.0  0.0 - 0.2 % Final   Neutrophils Relative % 12/04/2022 57  % Final   Neutro Abs  12/04/2022 4.8  1.7 - 7.7 K/uL Final   Lymphocytes Relative 12/04/2022 36  % Final   Lymphs Abs 12/04/2022 3.0  0.7 - 4.0 K/uL Final   Monocytes Relative 12/04/2022 6  % Final   Monocytes Absolute 12/04/2022 0.5  0.1 - 1.0 K/uL Final   Eosinophils Relative 12/04/2022 0  % Final   Eosinophils Absolute 12/04/2022 0.0  0.0 - 0.5 K/uL Final   Basophils Relative 12/04/2022 1  % Final   Basophils Absolute 12/04/2022 0.0  0.0 - 0.1 K/uL Final   Immature Granulocytes 12/04/2022 0  % Final   Abs Immature Granulocytes 12/04/2022 0.03  0.00 - 0.07 K/uL Final   Performed at [facility], [address] [place], [place] [address]   Sodium 12/04/2022 135  135 - 145 mmol/L Final   Potassium 12/04/2022 4.1  3.5 - 5.1 mmol/L Final   Chloride 12/04/2022 98  98 - 111 mmol/L Final   CO2 12/04/2022 27  22 - 32 mmol/L Final   Glucose, Bld 12/04/2022 92  70 - 99 mg/dL Final    Glucose reference range applies only to samples taken after fasting for at least 8 hours.   BUN 12/04/2022 10  6 - 20 mg/dL Final   Creatinine, Ser 12/04/2022 0.59  0.44 - 1.00 mg/dL Final   Calcium 81/03/7508 9.3  8.9 - 10.3 mg/dL Final   Total Protein 25/85/2778 7.0  6.5 - 8.1 g/dL Final   Albumin 24/23/5361 4.5  3.5 - 5.0 g/dL Final   AST 44/31/5400 25  15 - 41 U/L Final   ALT 12/04/2022 24  0 - 44 U/L Final   Alkaline Phosphatase 12/04/2022 59  38 - 126 U/L Final   Total Bilirubin 12/04/2022 0.6  0.3 - 1.2 mg/dL Final   GFR, Estimated 12/04/2022 >60  >60 mL/min Final   Comment: (NOTE) Calculated using the CKD-EPI Creatinine Equation (2021)    Anion gap 12/04/2022 10  5 - 15 Final   Performed at [facility], [address] [place], [place] [address]   Hgb A1c MFr Bld 12/04/2022 4.9  4.8 - 5.6 % Final   Comment: (NOTE) Pre diabetes:          5.7%-6.4%  Diabetes:              >6.4%  Glycemic control for   <7.0% adults with diabetes    Mean Plasma Glucose 12/04/2022 93.93  mg/dL Final   Performed at [facility], [address], [place], [place] [address]   Alcohol, Ethyl (B) 12/04/2022 <10  <10 mg/dL Final   Comment: (NOTE) Lowest detectable limit for serum alcohol is 10 mg/dL.  For medical purposes only. Performed at [facility], [address] [place], [place] [address]    POC Amphetamine UR 12/04/2022 None Detected  NONE DETECTED (Cut Off Level 1000 ng/mL) Final   POC Secobarbital (BAR) 12/04/2022 None Detected  NONE DETECTED (Cut Off Level 300 ng/mL) Final   POC Buprenorphine (BUP) 12/04/2022 None Detected  NONE DETECTED (Cut Off Level 10 ng/mL) Final   POC Oxazepam (BZO) 12/04/2022 None Detected  NONE DETECTED (Cut Off Level 300 ng/mL) Final   POC Cocaine UR 12/04/2022 None Detected  NONE DETECTED (Cut Off Level 300 ng/mL) Final   POC Methamphetamine UR 12/04/2022 None Detected  NONE DETECTED (Cut Off Level 1000 ng/mL) Final   POC Morphine  12/04/2022 None Detected  NONE DETECTED (Cut Off Level 300 ng/mL) Final  POC Methadone UR 12/04/2022 None Detected  NONE DETECTED (Cut Off Level 300 ng/mL) Final   POC Oxycodone UR 12/04/2022 None Detected  NONE DETECTED (Cut Off Level 100 ng/mL) Final   POC Marijuana UR 12/04/2022 None Detected  NONE DETECTED (Cut Off Level 50 ng/mL) Final   Preg Test, Ur 12/04/2022 Negative  Negative Final   Cholesterol 12/04/2022 190  0 - 200 mg/dL Final   Triglycerides 78/29/5621 74  <150 mg/dL Final   HDL 30/86/5784 61  >40 mg/dL Final   Total CHOL/HDL Ratio 12/04/2022 3.1  RATIO Final   VLDL 12/04/2022 15  0 - 40 mg/dL Final   LDL Cholesterol 12/04/2022 114 (H)  0 - 99 mg/dL Final   Comment:        Total Cholesterol/HDL:CHD Risk Coronary Heart Disease Risk Table                     Men   Women  1/2 Average Risk   3.4   3.3  Average Risk       5.0   4.4  2 X Average Risk   9.6   7.1  3 X Average Risk  23.4   11.0        Use the calculated Patient Ratio above and the CHD Risk Table to determine the patient's CHD Risk.        ATP III CLASSIFICATION (LDL):  <100     mg/dL   Optimal  696-295  mg/dL   Near or Above                    Optimal  130-159  mg/dL   Borderline  284-132  mg/dL   High  >440     mg/dL   Very High Performed at [facility], [address], [place], [place] [address]    TSH 12/04/2022 3.669  0.350 - 4.500 uIU/mL Final   Comment: Performed by a 3rd Generation assay with a functional sensitivity of <=0.01 uIU/mL. Performed at [facility], [address] [place], [place] [address]   Admission on 09/21/2022, Discharged on 09/22/2022  Component Date Value Ref Range Status   WBC 09/21/2022 8.4  4.0 - 10.5 K/uL Final   RBC 09/21/2022 4.54  3.87 - 5.11 MIL/uL Final   Hemoglobin 09/21/2022 14.2  12.0 - 15.0 g/dL Final   HCT 40/34/7425 41.7  36.0 - 46.0 % Final   MCV 09/21/2022 91.9  80.0 - 100.0 fL Final   MCH 09/21/2022 31.3  26.0 - 34.0 pg Final   MCHC  09/21/2022 34.1  30.0 - 36.0 g/dL Final   RDW 95/63/8756 12.6  11.5 - 15.5 % Final   Platelets 09/21/2022 327  150 - 400 K/uL Final   nRBC 09/21/2022 0.0  0.0 - 0.2 % Final   Neutrophils Relative % 09/21/2022 47  % Final   Neutro Abs 09/21/2022 3.9  1.7 - 7.7 K/uL Final   Lymphocytes Relative 09/21/2022 46  % Final   Lymphs Abs 09/21/2022 3.9  0.7 - 4.0 K/uL Final   Monocytes Relative 09/21/2022 5  % Final   Monocytes Absolute 09/21/2022 0.4  0.1 - 1.0 K/uL Final   Eosinophils Relative 09/21/2022 1  % Final   Eosinophils Absolute 09/21/2022 0.1  0.0 - 0.5 K/uL Final   Basophils Relative 09/21/2022 1  % Final   Basophils Absolute 09/21/2022 0.1  0.0 - 0.1 K/uL Final   Immature Granulocytes 09/21/2022 0  %  Final   Abs Immature Granulocytes 09/21/2022 0.02  0.00 - 0.07 K/uL Final   Performed at [facility], [address] [place], [place] [address]   Sodium 09/21/2022 140  135 - 145 mmol/L Final   Potassium 09/21/2022 3.8  3.5 - 5.1 mmol/L Final   Chloride 09/21/2022 105  98 - 111 mmol/L Final   CO2 09/21/2022 23  22 - 32 mmol/L Final   Glucose, Bld 09/21/2022 95  70 - 99 mg/dL Final   Glucose reference range applies only to samples taken after fasting for at least 8 hours.   BUN 09/21/2022 14  6 - 20 mg/dL Final   Creatinine, Ser 09/21/2022 0.71  0.44 - 1.00 mg/dL Final   Calcium 60/45/4098 9.3  8.9 - 10.3 mg/dL Final   Total Protein 11/91/4782 6.8  6.5 - 8.1 g/dL Final   Albumin 95/62/1308 4.1  3.5 - 5.0 g/dL Final   AST 65/78/4696 22  15 - 41 U/L Final   ALT 09/21/2022 21  0 - 44 U/L Final   Alkaline Phosphatase 09/21/2022 75  38 - 126 U/L Final   Total Bilirubin 09/21/2022 0.2 (L)  0.3 - 1.2 mg/dL Final   GFR, Estimated 09/21/2022 >60  >60 mL/min Final   Comment: (NOTE) Calculated using the CKD-EPI Creatinine Equation (2021)    Anion gap 09/21/2022 12  5 - 15 Final   Performed at [facility], [address] [place], [place] [address]   Alcohol, Ethyl (B)  09/21/2022 68 (H)  <10 mg/dL Final   Comment: (NOTE) Lowest detectable limit for serum alcohol is 10 mg/dL.  For medical purposes only. Performed at [facility], [address] [place], [place] [address]    Preg Test, Ur 09/21/2022 Negative  Negative Final   POC Amphetamine UR 09/21/2022 None Detected  NONE DETECTED (Cut Off Level 1000 ng/mL) Final   POC Secobarbital (BAR) 09/21/2022 None Detected  NONE DETECTED (Cut Off Level 300 ng/mL) Final   POC Buprenorphine (BUP) 09/21/2022 None Detected  NONE DETECTED (Cut Off Level 10 ng/mL) Final   POC Oxazepam (BZO) 09/21/2022 None Detected  NONE DETECTED (Cut Off Level 300 ng/mL) Final   POC Cocaine UR 09/21/2022 None Detected  NONE DETECTED (Cut Off Level 300 ng/mL) Final   POC Methamphetamine UR 09/21/2022 None Detected  NONE DETECTED (Cut Off Level 1000 ng/mL) Final   POC Morphine 09/21/2022 None Detected  NONE DETECTED (Cut Off Level 300 ng/mL) Final   POC Methadone UR 09/21/2022 None Detected  NONE DETECTED (Cut Off Level 300 ng/mL) Final   POC Oxycodone UR 09/21/2022 None Detected  NONE DETECTED (Cut Off Level 100 ng/mL) Final   POC Marijuana UR 09/21/2022 None Detected  NONE DETECTED (Cut Off Level 50 ng/mL) Final   Preg Test, Ur 09/21/2022 NEGATIVE  NEGATIVE Final   Comment:        THE SENSITIVITY OF THIS METHODOLOGY IS >24 mIU/mL     Allergies: Patient has no known allergies.  Medications:  Facility Ordered Medications  Medication   acetaminophen (TYLENOL) tablet 650 mg   alum & mag hydroxide-simeth (MAALOX/MYLANTA) 200-200-20 MG/5ML suspension 30 mL   magnesium hydroxide (MILK OF MAGNESIA) suspension 30 mL   hydrOXYzine (ATARAX) tablet 25 mg   traZODone (DESYREL) tablet 50 mg   ARIPiprazole (ABILIFY) tablet 2 mg   multivitamin with minerals tablet 1 tablet   PTA Medications  Medication Sig   ibuprofen (ADVIL) 200 MG tablet Take 400 mg by  mouth every 6 (six) hours as needed (For chronic pain).   adalimumab  (HUMIRA, 2 PEN,) 40 MG/0.4ML pen Inject 40 mg into the skin every 14 (fourteen) days.   hydrOXYzine (ATARAX) 10 MG tablet Take 1 tablet (10 mg total) by mouth 3 (three) times daily as needed for anxiety.   traZODone (DESYREL) 50 MG tablet Take 1 tablet (50 mg total) by mouth at bedtime as needed for sleep.   Multiple Vitamin (MULTIVITAMIN WITH MINERALS) TABS tablet Take 1 tablet by mouth daily.   tiZANidine (ZANAFLEX) 4 MG tablet TAKE ONE TABLET BY MOUTH EVERY 6 HOURS AS NEEDED FOR MUSCLE SPASMS      Medical Decision Making  Patient reported active suicidal ideation with plans and means.  she says she is unable to contract for safety at this time.  Patient will be admitted to [facility] for continuous assessment with follow-up by psychiatry     Recommendations  Based on my evaluation the patient does not appear to have an emergency medical condition.  Maricela Bo, NP 12/05/22  4:01 AM

## 2022-12-04 NOTE — BH Assessment (Signed)
Christina Hull is a 32 year old female presenting as a voluntary walk-in to [facility] due to [facility] with a plan. Patient denied HI, psychosis and alcohol/drug usage. Patient reports SI since middle school. Patient reports SI with plan worsened within the past week. Patient reported SI with plan to go to fathers house and shoot self with gun, crash car off the road or to drive to [facility] and jump off. Patient is currently being [facility] by [facility] for [facility] and medication management. Patient is unable to contract for safety.

## 2022-12-05 ENCOUNTER — Encounter (HOSPITAL_COMMUNITY): Payer: Self-pay | Admitting: Emergency Medicine

## 2022-12-05 ENCOUNTER — Inpatient Hospital Stay (HOSPITAL_COMMUNITY)
Admission: AD | Admit: 2022-12-05 | Discharge: 2022-12-09 | DRG: 885 | Disposition: A | Discharge status: Home / Self Care | Payer: 59 | Source: Intra-hospital | Attending: Psychiatry | Admitting: Psychiatry

## 2022-12-05 ENCOUNTER — Encounter (HOSPITAL_COMMUNITY): Payer: Self-pay | Admitting: Psychiatry

## 2022-12-05 ENCOUNTER — Other Ambulatory Visit: Payer: Self-pay

## 2022-12-05 DIAGNOSIS — E785 Hyperlipidemia, unspecified: Secondary | ICD-10-CM | POA: Diagnosis present

## 2022-12-05 DIAGNOSIS — Z8261 Family history of arthritis: Secondary | ICD-10-CM

## 2022-12-05 DIAGNOSIS — F1020 Alcohol dependence, uncomplicated: Secondary | ICD-10-CM | POA: Diagnosis present

## 2022-12-05 DIAGNOSIS — F1090 Alcohol use, unspecified, uncomplicated: Secondary | ICD-10-CM | POA: Diagnosis present

## 2022-12-05 DIAGNOSIS — F603 Borderline personality disorder: Secondary | ICD-10-CM | POA: Diagnosis present

## 2022-12-05 DIAGNOSIS — L4050 Arthropathic psoriasis, unspecified: Secondary | ICD-10-CM | POA: Diagnosis present

## 2022-12-05 DIAGNOSIS — R45851 Suicidal ideations: Secondary | ICD-10-CM | POA: Diagnosis present

## 2022-12-05 DIAGNOSIS — Z634 Disappearance and death of family member: Secondary | ICD-10-CM

## 2022-12-05 DIAGNOSIS — Z79899 Other long term (current) drug therapy: Secondary | ICD-10-CM

## 2022-12-05 DIAGNOSIS — F411 Generalized anxiety disorder: Secondary | ICD-10-CM | POA: Diagnosis present

## 2022-12-05 DIAGNOSIS — F332 Major depressive disorder, recurrent severe without psychotic features: Secondary | ICD-10-CM | POA: Diagnosis present

## 2022-12-05 DIAGNOSIS — Z23 Encounter for immunization: Secondary | ICD-10-CM

## 2022-12-05 DIAGNOSIS — Z825 Family history of asthma and other chronic lower respiratory diseases: Secondary | ICD-10-CM

## 2022-12-05 DIAGNOSIS — Z87891 Personal history of nicotine dependence: Secondary | ICD-10-CM

## 2022-12-05 LAB — COMPREHENSIVE METABOLIC PANEL
ALT: 24 U/L (ref 0–44)
AST: 25 U/L (ref 15–41)
Albumin: 4.5 g/dL (ref 3.5–5.0)
Alkaline Phosphatase: 59 U/L (ref 38–126)
Anion gap: 10 (ref 5–15)
BUN: 10 mg/dL (ref 6–20)
CO2: 27 mmol/L (ref 22–32)
Calcium: 9.3 mg/dL (ref 8.9–10.3)
Chloride: 98 mmol/L (ref 98–111)
Creatinine, Ser: 0.59 mg/dL (ref 0.44–1.00)
GFR, Estimated: >60 mL/min (ref >60)
Glucose, Bld: 92 mg/dL (ref 70–99)
Potassium: 4.1 mmol/L (ref 3.5–5.1)
Sodium: 135 mmol/L (ref 135–145)
Total Bilirubin: 0.6 mg/dL (ref 0.3–1.2)
Total Protein: 7 g/dL (ref 6.5–8.1)

## 2022-12-05 LAB — CBC WITH DIFFERENTIAL/PLATELET
Abs Immature Granulocytes: 0.03 10*3/uL (ref 0.00–0.07)
Basophils Absolute: 0 10*3/uL (ref 0.0–0.1)
Basophils Relative: 1 %
Eosinophils Absolute: 0 10*3/uL (ref 0.0–0.5)
Eosinophils Relative: 0 %
HCT: 42.8 % (ref 36.0–46.0)
Hemoglobin: 14.8 g/dL (ref 12.0–15.0)
Immature Granulocytes: 0 %
Lymphocytes Relative: 36 %
Lymphs Abs: 3 10*3/uL (ref 0.7–4.0)
MCH: 32.2 pg (ref 26.0–34.0)
MCHC: 34.6 g/dL (ref 30.0–36.0)
MCV: 93 fL (ref 80.0–100.0)
Monocytes Absolute: 0.5 10*3/uL (ref 0.1–1.0)
Monocytes Relative: 6 %
Neutro Abs: 4.8 10*3/uL (ref 1.7–7.7)
Neutrophils Relative %: 57 %
Platelets: 262 10*3/uL (ref 150–400)
RBC: 4.6 MIL/uL (ref 3.87–5.11)
RDW: 12.5 % (ref 11.5–15.5)
WBC: 8.4 10*3/uL (ref 4.0–10.5)
nRBC: 0 % (ref 0.0–0.2)

## 2022-12-05 LAB — HEMOGLOBIN A1C
Hgb A1c MFr Bld: 4.9 % (ref 4.8–5.6)
Mean Plasma Glucose: 93.93 mg/dL

## 2022-12-05 LAB — LIPID PANEL
Cholesterol: 190 mg/dL (ref 0–200)
HDL: 61 mg/dL (ref >40)
LDL Cholesterol: 114 mg/dL — ABNORMAL HIGH (ref 0–99)
Total CHOL/HDL Ratio: 3.1 RATIO
Triglycerides: 74 mg/dL (ref <150)
VLDL: 15 mg/dL (ref 0–40)

## 2022-12-05 LAB — ETHANOL: Alcohol, Ethyl (B): <10 mg/dL (ref <10)

## 2022-12-05 LAB — TSH: TSH: 3.669 u[IU]/mL (ref 0.350–4.500)

## 2022-12-05 LAB — POCT PREGNANCY, URINE: Preg Test, Ur: NEGATIVE

## 2022-12-05 MED ORDER — NALTREXONE HCL 50 MG PO TABS
50.0000 mg | ORAL_TABLET | Freq: Every day | ORAL | Status: DC
  Administered 2022-12-05: 50 mg via ORAL
  Filled 2022-12-05: qty 1

## 2022-12-05 MED ORDER — PROPRANOLOL HCL 10 MG PO TABS: 10.0000 mg | ORAL_TABLET | Freq: Two times a day (BID) | ORAL | Status: DC

## 2022-12-05 MED ORDER — DULOXETINE HCL 60 MG PO CPEP: 60.0000 mg | ORAL_CAPSULE | Freq: Every day | ORAL | Status: DC

## 2022-12-05 MED ORDER — PROPRANOLOL HCL 10 MG PO TABS: 10.0000 mg | ORAL_TABLET | Freq: Two times a day (BID) | ORAL | Status: DC | PRN

## 2022-12-05 MED ORDER — ACETAMINOPHEN 325 MG PO TABS: 650.0000 mg | ORAL_TABLET | Freq: Four times a day (QID) | ORAL | Status: DC | PRN

## 2022-12-05 MED ORDER — LORAZEPAM 2 MG/ML IJ SOLN: 2.0000 mg | Freq: Three times a day (TID) | INTRAMUSCULAR | Status: DC | PRN

## 2022-12-05 MED ORDER — DULOXETINE HCL 30 MG PO CPEP
90.0000 mg | ORAL_CAPSULE | Freq: Every day | ORAL | Status: DC
  Administered 2022-12-06: 90 mg via ORAL
  Filled 2022-12-05 (×3): qty 3

## 2022-12-05 MED ORDER — DIPHENHYDRAMINE HCL 50 MG/ML IJ SOLN: 50.0000 mg | Freq: Three times a day (TID) | INTRAMUSCULAR | Status: DC | PRN

## 2022-12-05 MED ORDER — INFLUENZA VIRUS VACC SPLIT PF (FLUZONE) 0.5 ML IM SUSY
0.5000 mL | PREFILLED_SYRINGE | INTRAMUSCULAR | Status: AC
  Administered 2022-12-06: 0.5 mL via INTRAMUSCULAR
  Filled 2022-12-05: qty 0.5

## 2022-12-05 MED ORDER — TRAZODONE HCL 50 MG PO TABS
50.0000 mg | ORAL_TABLET | Freq: Every evening | ORAL | Status: DC | PRN
  Administered 2022-12-05 – 2022-12-07 (×3): 50 mg via ORAL
  Filled 2022-12-05 (×2): qty 1

## 2022-12-05 MED ORDER — DIPHENHYDRAMINE HCL 25 MG PO CAPS: 50.0000 mg | ORAL_CAPSULE | Freq: Three times a day (TID) | ORAL | Status: DC | PRN

## 2022-12-05 MED ORDER — ADULT MULTIVITAMIN W/MINERALS CH
1.0000 | ORAL_TABLET | Freq: Every day | ORAL | Status: DC
  Administered 2022-12-06 – 2022-12-09 (×4): 1 via ORAL
  Filled 2022-12-05 (×6): qty 1

## 2022-12-05 MED ORDER — ALUM & MAG HYDROXIDE-SIMETH 200-200-20 MG/5ML PO SUSP: 30.0000 mL | ORAL | Status: DC | PRN

## 2022-12-05 MED ORDER — ARIPIPRAZOLE 2 MG PO TABS
4.0000 mg | ORAL_TABLET | Freq: Every day | ORAL | Status: DC
  Filled 2022-12-05 (×2): qty 2

## 2022-12-05 MED ORDER — LORAZEPAM 1 MG PO TABS: 2.0000 mg | ORAL_TABLET | Freq: Three times a day (TID) | ORAL | Status: DC | PRN

## 2022-12-05 MED ORDER — NALTREXONE HCL 50 MG PO TABS
50.0000 mg | ORAL_TABLET | Freq: Every day | ORAL | Status: DC
  Administered 2022-12-06 – 2022-12-09 (×4): 50 mg via ORAL
  Filled 2022-12-05 (×6): qty 1

## 2022-12-05 MED ORDER — HYDROXYZINE HCL 25 MG PO TABS
25.0000 mg | ORAL_TABLET | Freq: Three times a day (TID) | ORAL | Status: DC | PRN
  Administered 2022-12-05 – 2022-12-08 (×3): 25 mg via ORAL
  Filled 2022-12-05 (×3): qty 1

## 2022-12-05 MED ORDER — HALOPERIDOL 5 MG PO TABS: 5.0000 mg | ORAL_TABLET | Freq: Three times a day (TID) | ORAL | Status: DC | PRN

## 2022-12-05 MED ORDER — HALOPERIDOL LACTATE 5 MG/ML IJ SOLN: 5.0000 mg | Freq: Three times a day (TID) | INTRAMUSCULAR | Status: DC | PRN

## 2022-12-05 MED ORDER — MAGNESIUM HYDROXIDE 400 MG/5ML PO SUSP: 30.0000 mL | Freq: Every day | ORAL | Status: DC | PRN

## 2022-12-05 MED ORDER — DULOXETINE HCL 60 MG PO CPEP
90.0000 mg | ORAL_CAPSULE | Freq: Every day | ORAL | Status: DC
  Administered 2022-12-05: 90 mg via ORAL
  Filled 2022-12-05: qty 1

## 2022-12-05 MED ORDER — ARIPIPRAZOLE 2 MG PO TABS: 4.0000 mg | ORAL_TABLET | Freq: Every day | ORAL | Status: DC

## 2022-12-05 NOTE — [facility] Group Notes (Signed)
[facility] Group Notes:  (Nursing/MHT/Case Management/Adjunct)  Date:  12/05/2022  Time:  9:02 PM  Type of Therapy:  Group Therapy  Participation Level:  Active  Participation Quality:  Appropriate  Affect:  Appropriate  Cognitive:  Appropriate  Insight:  Appropriate  Engagement in Group:  Improving  Modes of Intervention:  Discussion  Summary of Progress/Problems: Pt attended  AA Group Meeting Micky Overturf E Tiersa Dayley 12/05/2022, 9:02 PM

## 2022-12-05 NOTE — Progress Notes (Signed)
   12/05/22 1500  Psych Admission Type (Psych Patients Only)  Admission Status Voluntary  Psychosocial Assessment  Patient Complaints Worrying;Depression  Eye Contact Fair  Facial Expression Animated  Affect Appropriate to circumstance  Speech Logical/coherent  Interaction Assertive  Motor Activity Other (Comment) (WDL)  Appearance/Hygiene Unremarkable  Behavior Characteristics Cooperative  Mood Depressed  Thought Process  Coherency WDL  Content WDL  Delusions None reported or observed  Perception WDL  Hallucination None reported or observed  Judgment Poor  Confusion None  Danger to Self  Current suicidal ideation? Denies (denies)  Self-Injurious Behavior No self-injurious ideation or behavior indicators observed or expressed   Agreement Not to Harm Self Yes  Description of Agreement verbal  Danger to Others  Danger to Others None reported or observed   Pt is new admit and states she is here because she had a meltdown last night related to her AA sponsor quitting on her. Pt states she also attended an AA video chat meet recently in which suicide was discussed and this was also a trigger. Pt states she has suicidal thoughts daily and had a suicide attempt in January of this year via attempting to stab herself. Pt denies alcohol or drug use and states she is 60 days sober from alcohol. Pt is cooperative upon assessment and currently denies SI.

## 2022-12-05 NOTE — ED Provider Notes (Signed)
FBC/OBS ASAP Discharge Summary  Date and Time: 12/05/2022 2:48 PM  Name: Christina Hull  MRN:  [id number]   Discharge Diagnoses:  Final diagnoses:  Severe episode of recurrent major depressive disorder, without psychotic features (HCC)   Cecilio Asper NP 12/04/2022 "HPI: Christina Hull is a 32 year old female with psychiatric history of major depressive disorder and anxiety, suicidal ideation, and alcohol use disorder (currently 60 days sober).  Patient was accompanied to [facility] by her husband Casimiro Needle [phone]).  Patient gave verbal permission for her husband to remain present and participating in her assessment."   Patient was to the continuous assessment unit while awaiting inpatient psychiatric bed availability  Patient seen face-to-face by this provider and chart reviewed, and case consulted with Dr. Lucianne Muss on 12/05/2022.  Patient has a psychiatric history that includes MDD, alcohol use disorder, and GAD.  She has a history of inpatient psychiatric medicines with her last admission being 09/2022 at [facility]   Subjective:   On today's assessment patient is observed laying in her bed asleep.  She is easily awakened.  She is alert/oriented x 4, cooperative, and fairly attentive.  She has normal speech and behavior.  She reports feeling much better today.  She is currently denying any suicidal/homicidal ideations.  She is currently contracting for safety and is requesting to be discharged.  However discussed with patient suicidal comments with a plan that was stated upon admission and she agrees to the inpatient admission.  She endorses chronic depression and has a depressed affect.  She denies homicidal ideations.  She denies auditory/visual hallucinations.  She does not appear to be responding to internal/external stimuli.  Stay Summary:   Patient has remained calm and cooperative while on the unit.  She has needed no as needed medications for agitation.  She continues to meet criteria for  inpatient psychiatric admission.  She has been accepted to [facility] for inpatient admission.  Restarted patient's home medications this a.m. and she has remained compliant.  Total Time spent with patient: 30 minutes  Past Psychiatric History: see H&P Past Medical History: see H&P Family History: see H&P Family Psychiatric History: see H&P Social History: see H&P Tobacco Cessation:  N/A, patient does not currently use tobacco products  Current Medications:  Current Facility-Administered Medications  Medication Dose Route Frequency Provider Last Rate Last Admin   acetaminophen (TYLENOL) tablet 650 mg  650 mg Oral Q6H PRN Ajibola, Ene A, NP       alum & mag hydroxide-simeth (MAALOX/MYLANTA) 200-200-20 MG/5ML suspension 30 mL  30 mL Oral Q4H PRN Ajibola, Ene A, NP       [START ON 12/06/2022] ARIPiprazole (ABILIFY) tablet 4 mg  4 mg Oral QHS Ardis Hughs, NP       DULoxetine (CYMBALTA) DR capsule 90 mg  90 mg Oral Daily Ardis Hughs, NP   90 mg at 12/05/22 1408   hydrOXYzine (ATARAX) tablet 25 mg  25 mg Oral TID PRN Ajibola, Ene A, NP       magnesium hydroxide (MILK OF MAGNESIA) suspension 30 mL  30 mL Oral Daily PRN Ajibola, Ene A, NP       multivitamin with minerals tablet 1 tablet  1 tablet Oral Daily Ajibola, Ene A, NP   1 tablet at 12/05/22 0923   naltrexone (DEPADE) tablet 50 mg  50 mg Oral Daily Ardis Hughs, NP   50 mg at 12/05/22 1408   propranolol (INDERAL) tablet 10 mg  10 mg Oral Q12H  PRN Ardis Hughs, NP       traZODone (DESYREL) tablet 50 mg  50 mg Oral QHS PRN Ajibola, Ene A, NP   50 mg at 12/04/22 2336   Current Outpatient Medications  Medication Sig Dispense Refill   adalimumab (HUMIRA, 2 PEN,) 40 MG/0.4ML pen Inject 40 mg into the skin every 14 (fourteen) days.     ARIPiprazole (ABILIFY) 2 MG tablet Take 1 tablet (2 mg total) by mouth daily. (Patient taking differently: Take 4 mg by mouth at bedtime.) 30 tablet 0   DULoxetine (CYMBALTA) 60 MG capsule  Take 1 capsule (60 mg total) by mouth daily. (Patient taking differently: Take 60 mg by mouth daily. Take with a 30mg  capsule for a total of 90mg .) 30 capsule 0   hydrOXYzine (ATARAX) 10 MG tablet Take 1 tablet (10 mg total) by mouth 3 (three) times daily as needed for anxiety. (Patient taking differently: Take 10 mg by mouth 2 (two) times daily as needed for anxiety.) 30 tablet 0   Multiple Vitamin (MULTIVITAMIN WITH MINERALS) TABS tablet Take 1 tablet by mouth daily.     naltrexone (DEPADE) 50 MG tablet Take 50 mg by mouth daily.     propranolol (INDERAL) 10 MG tablet Take 1 tablet (10 mg total) by mouth every 12 (twelve) hours. (Patient taking differently: Take 10 mg by mouth 2 (two) times daily as needed (For anxiety).) 60 tablet 0   tiZANidine (ZANAFLEX) 4 MG tablet TAKE ONE TABLET BY MOUTH EVERY 6 HOURS AS NEEDED FOR MUSCLE SPASMS (Patient taking differently: Take 4 mg by mouth at bedtime as needed for muscle spasms.) 60 tablet 0   traZODone (DESYREL) 50 MG tablet Take 1 tablet (50 mg total) by mouth at bedtime as needed for sleep. 30 tablet 0    PTA Medications:  PTA Medications  Medication Sig   adalimumab (HUMIRA, 2 PEN,) 40 MG/0.4ML pen Inject 40 mg into the skin every 14 (fourteen) days.   propranolol (INDERAL) 10 MG tablet Take 1 tablet (10 mg total) by mouth every 12 (twelve) hours. (Patient taking differently: Take 10 mg by mouth 2 (two) times daily as needed (For anxiety).)   ARIPiprazole (ABILIFY) 2 MG tablet Take 1 tablet (2 mg total) by mouth daily. (Patient taking differently: Take 4 mg by mouth at bedtime.)   DULoxetine (CYMBALTA) 60 MG capsule Take 1 capsule (60 mg total) by mouth daily. (Patient taking differently: Take 60 mg by mouth daily. Take with a 30mg  capsule for a total of 90mg .)   hydrOXYzine (ATARAX) 10 MG tablet Take 1 tablet (10 mg total) by mouth 3 (three) times daily as needed for anxiety. (Patient taking differently: Take 10 mg by mouth 2 (two) times daily as  needed for anxiety.)   traZODone (DESYREL) 50 MG tablet Take 1 tablet (50 mg total) by mouth at bedtime as needed for sleep.   Multiple Vitamin (MULTIVITAMIN WITH MINERALS) TABS tablet Take 1 tablet by mouth daily.   tiZANidine (ZANAFLEX) 4 MG tablet TAKE ONE TABLET BY MOUTH EVERY 6 HOURS AS NEEDED FOR MUSCLE SPASMS (Patient taking differently: Take 4 mg by mouth at bedtime as needed for muscle spasms.)   naltrexone (DEPADE) 50 MG tablet Take 50 mg by mouth daily.   Facility Ordered Medications  Medication   acetaminophen (TYLENOL) tablet 650 mg   alum & mag hydroxide-simeth (MAALOX/MYLANTA) 200-200-20 MG/5ML suspension 30 mL   magnesium hydroxide (MILK OF MAGNESIA) suspension 30 mL   hydrOXYzine (ATARAX) tablet 25 mg  traZODone (DESYREL) tablet 50 mg   multivitamin with minerals tablet 1 tablet   naltrexone (DEPADE) tablet 50 mg   propranolol (INDERAL) tablet 10 mg   DULoxetine (CYMBALTA) DR capsule 90 mg   [START ON 12/06/2022] ARIPiprazole (ABILIFY) tablet 4 mg       06/15/2022    9:30 AM 04/12/2022    3:14 PM 02/01/2022    8:16 AM  Depression screen PHQ 2/9  Decreased Interest 0 3 0  Down, Depressed, Hopeless 0 3 0  PHQ - 2 Score 0 6 0  Altered sleeping  2   Tired, decreased energy  3   Change in appetite  3   Feeling bad or failure about yourself   3   Trouble concentrating  2   Moving slowly or fidgety/restless  0   Suicidal thoughts  3   PHQ-9 Score  22   Difficult doing work/chores  Somewhat difficult     Flowsheet Row ED from 12/04/2022 in [facility] (Dis[facility]) [facility] 09[facility]21/2022 in [facility]  C-SSRS RISK CATEGORY High Risk High Risk High Risk       Musculoskeletal  Strength & Muscle Tone: within normal limits Gait & Station: normal Patient leans: N/A  Psychiatric Specialty Exam  Presentation  General Appearance:   Casual  Eye Contact: Good  Speech: Clear and Coherent; Normal Rate  Speech Volume: Normal  Handedness: Right   Mood and Affect  Mood: Anxious; Depressed  Affect: Congruent   Thought Process  Thought Processes: Coherent  Descriptions of Associations:Intact  Orientation:Full (Time, Place and Person)  Thought Content:Logical  Diagnosis of Schizophrenia or Schizoaffective disorder in past: No    Hallucinations:Hallucinations: None  Ideas of Reference:None  Suicidal Thoughts:Suicidal Thoughts: No  Homicidal Thoughts:Homicidal Thoughts: No   Sensorium  Memory: Immediate Good; Recent Good  Judgment: Fair  Insight: Good; Fair   Executive Functions  Concentration: Good  Attention Span: Good  Recall: Good  Fund of Knowledge: Good  Language: Good   Psychomotor Activity  Psychomotor Activity: Psychomotor Activity: Normal   Assets  Assets: Communication Skills; Desire for Improvement; Housing; Intimacy; Physical Health; Resilience; Social Support   Sleep  Sleep: Sleep: Fair   No data recorded  Physical Exam  Physical Exam Vitals and nursing note reviewed.  Constitutional:      General: She is not in acute distress.    Appearance: Normal appearance. She is not ill-appearing.  HENT:     Head: Normocephalic.  Eyes:     General:        Right eye: No discharge.        Left eye: No discharge.     Pupils: Pupils are equal, round, and reactive to light.  Cardiovascular:     Rate and Rhythm: Normal rate.  Pulmonary:     Effort: Pulmonary effort is normal. No respiratory distress.  Musculoskeletal:        General: Normal range of motion.     Cervical back: Normal range of motion.  Skin:    General: Skin is warm and dry.     Coloration: Skin is not jaundiced or pale.  Neurological:     Mental Status: She is alert and oriented to person, place, and time.  Psychiatric:        Attention and Perception: Attention and perception  normal.        Mood and Affect: Affect normal. Mood is  anxious and depressed.        Speech: Speech normal.        Behavior: Behavior normal. Behavior is cooperative.        Thought Content: Thought content normal.        Cognition and Memory: Cognition normal.        Judgment: Judgment normal.    Review of Systems  Constitutional: Negative.   HENT: Negative.    Eyes: Negative.   Respiratory: Negative.    Cardiovascular: Negative.   Musculoskeletal: Negative.   Skin: Negative.   Neurological: Negative.   Psychiatric/Behavioral:  Positive for depression and suicidal ideas. The patient is nervous/anxious.    Blood pressure 103/74, pulse 74, temperature 98.3 F (36.8 C), resp. rate 19, SpO2 100%. There is no height or weight on file to calculate BMI.    Disposition:  Discharge patient and readmit to [facility] for inpatient psychiatric admission.  Admission orders placed   Ardis Hughs, NP 12/05/2022, 2:48 PM

## 2022-12-05 NOTE — ED Notes (Signed)
Report called to jacqueline RN at [facility] and safe transport called.

## 2022-12-05 NOTE — Tx Team (Signed)
Initial Treatment Plan 12/05/2022 4:06 PM Lanore Doolin Wymer ZOX:[id number]    PATIENT STRESSORS: Loss of relationship with AA sponsor      PATIENT STRENGTHS: General fund of knowledge  Motivation for treatment/growth  Supportive family/friends    PATIENT IDENTIFIED PROBLEMS: MDD                     DISCHARGE CRITERIA:  Improved stabilization in mood, thinking, and/or behavior Motivation to continue treatment in a less acute level of care Need for constant or close observation no longer present Verbal commitment to aftercare and medication compliance  PRELIMINARY DISCHARGE PLAN: Attend 12-step recovery group Outpatient therapy Return to previous living arrangement  PATIENT/FAMILY INVOLVEMENT: This treatment plan has been presented to and reviewed with the patient, SHAVELLE TEP.  The patient and family have been given the opportunity to ask questions and make suggestions.  Laurance Flatten, RN 12/05/2022, 4:06 PM

## 2022-12-05 NOTE — Progress Notes (Signed)
Pt has been accepted to [facility] TODAY 12/05/2022, pending voluntary consent. Bed assignment: 303-2  Pt meets inpatient criteria per Vernard Gambles, NP  Attending Physician will be Nadir Abbott Pao, MD  Report can be called to: - Adult unit: [phone]  Pt can arrive after pending items are received  Care Team Notified: [facility] Rona Ravens, RN, Vernard Gambles, NP, Harless Litten, RN, and Thornton Dales, RN  [place], [place]  12/05/2022 12:19 PM

## 2022-12-05 NOTE — Progress Notes (Signed)
Patient has been brought to unit, familiarized with unit, medication has been given, snack offered, patient is lying in  bed sleeping, no distress noted, will continue to monitor patient for safety.

## 2022-12-06 DIAGNOSIS — F332 Major depressive disorder, recurrent severe without psychotic features: Secondary | ICD-10-CM

## 2022-12-06 MED ORDER — DULOXETINE HCL 60 MG PO CPEP
60.0000 mg | ORAL_CAPSULE | Freq: Every day | ORAL | Status: DC
  Administered 2022-12-07 – 2022-12-09 (×3): 60 mg via ORAL
  Filled 2022-12-06 (×5): qty 1

## 2022-12-06 MED ORDER — TIZANIDINE HCL 4 MG PO TABS
4.0000 mg | ORAL_TABLET | Freq: Every day | ORAL | Status: DC
  Administered 2022-12-06 – 2022-12-08 (×3): 4 mg via ORAL
  Filled 2022-12-06: qty 1
  Filled 2022-12-06: qty 2
  Filled 2022-12-06 (×3): qty 1

## 2022-12-06 MED ORDER — ARIPIPRAZOLE 5 MG PO TABS
5.0000 mg | ORAL_TABLET | Freq: Every day | ORAL | Status: DC
  Administered 2022-12-06 – 2022-12-08 (×3): 5 mg via ORAL
  Filled 2022-12-06 (×5): qty 1

## 2022-12-06 NOTE — Progress Notes (Signed)
D) Pt received calm, visible, participating in milieu, and in no acute distress. Pt A & O x4. Pt denies SI, HI, A/ V H, depression,  and pain at this time. Pt endorsed anxiety  at time of assessment. A) Pt encouraged to drink fluids. Pt encouraged to come to staff with needs. Pt encouraged to attend and participate in groups. Pt encouraged to set reachable goals.  R) Pt remained safe on unit, in no acute distress, will continue to assess.     12/06/22 2145  Psych Admission Type (Psych Patients Only)  Admission Status Voluntary  Psychosocial Assessment  Patient Complaints Anxiety;Insomnia  Eye Contact Fair  Facial Expression Animated  Affect Anxious  Speech Logical/coherent  Interaction Assertive  Motor Activity Other (Comment) (WNL)  Appearance/Hygiene Unremarkable  Behavior Characteristics Cooperative;Appropriate to situation  Mood Anxious  Thought Process  Coherency WDL  Content WDL  Delusions None reported or observed  Perception WDL  Hallucination None reported or observed  Judgment Impaired  Confusion None  Danger to Self  Current suicidal ideation? Denies  Agreement Not to Harm Self Yes  Description of Agreement verbal  Danger to Others  Danger to Others None reported or observed
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
## 2022-12-06 NOTE — [facility] Group Notes (Signed)
Adult Psychoeducational Group Note  Date:  12/06/2022 Time:  10:01 AM  Group Topic/Focus:  Emotional Education:   The focus of this group is to discuss what feelings/emotions are, and how they are experienced. Goals Group:   The focus of this group is to help patients establish daily goals to achieve during treatment and discuss how the patient can incorporate goal setting into their daily lives to aide in recovery. Orientation:   The focus of this group is to educate the patient on the purpose and policies of crisis stabilization and provide a format to answer questions about their admission.  The group details unit policies and expectations of patients while admitted.  Participation Level:  Active  Participation Quality:  Appropriate  Affect:  Appropriate  Cognitive:  Appropriate  Insight: Good  Engagement in Group:  Engaged  Modes of Intervention:  Discussion  Additional Comments:    Lucilla Edin 12/06/2022, 10:01 AM

## 2022-12-06 NOTE — Progress Notes (Signed)
Christina Hull, is ambulatory in the hallway and is calm, cooperative and pleasant but somewhat guarded.She currently denies SI/HI/AVH. Her vital signs are stable and she voices no complaints.

## 2022-12-06 NOTE — Progress Notes (Addendum)
Pt endorsed passive SI this morning with no plan at this time. Patient is able to verbally contracts for safety. Pt denied HI/AVH this morning. Pt rated her depression a 0/10, anxiety a 6/10, and feelings of hopelessness a 2/10. Pt reports that she slept "good" last night. Pt reports that her goal for today is to "meet with my team and stay busy". Pt has been pleasant, calm, and cooperative throughout the shift. RN provided support and encouragement to patient. Pt given scheduled medications as prescribed. Q15 min checks verified for safety. Patient verbally contracts for safety. Patient compliant with medications and treatment plan. Patient is interacting well on the unit. Pt is safe on the unit.   12/06/22 0930  Psych Admission Type (Psych Patients Only)  Admission Status Voluntary  Psychosocial Assessment  Patient Complaints Anxiety  Eye Contact Fair  Facial Expression Animated  Affect Anxious;Depressed  Speech Logical/coherent  Interaction Assertive  Motor Activity Other (Comment) (WDL)  Appearance/Hygiene Unremarkable  Behavior Characteristics Cooperative;Appropriate to situation  Mood Anxious;Depressed  Thought Process  Coherency WDL  Content WDL  Delusions None reported or observed  Perception WDL  Hallucination None reported or observed  Judgment Impaired  Confusion None  Danger to Self  Current suicidal ideation? Passive  Description of Suicide Plan No plan  Self-Injurious Behavior Some self-injurious ideation observed or expressed.  No lethal plan expressed   Agreement Not to Harm Self Yes  Description of Agreement Verbal  Danger to Others  Danger to Others None reported or observed

## 2022-12-06 NOTE — Group Note (Signed)
LCSW Group Therapy Note  Group Date: 12/06/2022 Start Time: 1100 End Time: 1145   Type of Therapy and Topic:  Group Therapy - How To Cope with Nervousness about Discharge   Participation Level:  Active   Description of Group This process group involved identification of patients' feelings about discharge. Some of them are scheduled to be discharged soon, while others are new admissions, but each of them was asked to share thoughts and feelings surrounding discharge from the hospital. One common theme was that they are excited at the prospect of going home, while another was that many of them are apprehensive about sharing why they were hospitalized. Patients were given the opportunity to discuss these feelings with their peers in preparation for discharge.  Therapeutic Goals  Patient will identify their overall feelings about pending discharge. Patient will think about how they might proactively address issues that they believe will once again arise once they get home (i.e. with parents). Patients will participate in discussion about having hope for change.   Summary of Patient Progress:  Christina Hull was very active throughout the session. Christina Hull demonstrated Dovray insight into the subject matter, and proved open to input from peers and feedback from CSW. Christina Hull was respectful of peers and participated throughout the entire session.   Therapeutic Modalities Cognitive Behavioral Therapy   Ane Payment, LCSW 12/06/2022  1:17 PM

## 2022-12-06 NOTE — Plan of Care (Signed)
  Problem: Education: Goal: Mental status will improve Outcome: Progressing  Pt is responding more when spoken to

## 2022-12-06 NOTE — [facility] Suicide Risk Assessment (Signed)
Suicide Risk Assessment  Admission Assessment    [facility] Admission Suicide Risk Assessment   Nursing information obtained from:  Patient Demographic factors:  Caucasian Current Mental Status:  NA Loss Factors:  Loss of significant relationship Historical Factors:  Prior suicide attempts Risk Reduction Factors:  Living with another person, especially a relative, Positive social support, Positive coping skills or problem solving skills  Total Time spent with patient: 30 minutes Principal Problem: MDD (major depressive disorder), recurrent episode, severe (HCC) Diagnosis:  Principal Problem:   MDD (major depressive disorder), recurrent episode, severe (HCC)  Subjective Data:  Christina Hull is a 32 year old Caucasian female with prior psychiatric history significant for MDD, generalized anxiety disorder, and alcohol use disorder who presents voluntarily to [facility] from [facility] for worsening depression x 1 week, resulting in suicidal ideation with several plans to go to her father's house and shot herself with a gun, driving to crash her car, or go to the [place] and jump [place] the cliff in the context of no specific stressors.  This is the patient third admission to [facility] for treatment of her mental illness.  After medical evaluation/stabilization & clearance, she was transferred to the [facility] for further psychiatric evaluation & treatments.    Continued Clinical Symptoms:    The "Alcohol Use Disorders Identification Test", Guidelines for Use in Primary Care, Second Edition.  World Science writer [facility]). Score between 0-7:  no or low risk or alcohol related problems. Score between 8-15:  moderate risk of alcohol related problems. Score between 16-19:  high risk of alcohol related problems. Score 20 or above:  warrants further diagnostic evaluation for alcohol dependence and treatment.  CLINICAL FACTORS:   Severe Anxiety and/or Agitation Depression:    Anhedonia Hopelessness Impulsivity Severe Alcohol/Substance Abuse/Dependencies More than one psychiatric diagnosis Previous Psychiatric Diagnoses and Treatments Medical Diagnoses and Treatments/Surgeries  Musculoskeletal: Strength & Muscle Tone: within normal limits Gait & Station: normal Patient leans: N/A  Psychiatric Specialty Exam:  Presentation  General Appearance:  Appropriate for Environment; Casual; Fairly Groomed  Eye Contact: Good  Speech: Clear and Coherent  Speech Volume: Normal  Handedness: Right  Mood and Affect  Mood: Anxious; Depressed  Affect: Appropriate; Congruent  Thought Process  Thought Processes: Coherent  Descriptions of Associations:Intact  Orientation:Full (Time, Place and Person)  Thought Content:Logical  History of Schizophrenia/Schizoaffective disorder:No  Duration of Psychotic Symptoms:No data recorded Hallucinations:Hallucinations: None  Ideas of Reference:None  Suicidal Thoughts:Suicidal Thoughts: No  Homicidal Thoughts:Homicidal Thoughts: No  Sensorium  Memory: Immediate Good; Recent Good  Judgment: Fair  Insight: Fair  Executive Functions  Concentration: Good  Attention Span: Good  Recall: Fair  Fund of Knowledge: Fair  Language: Fair  Psychomotor Activity  Psychomotor Activity: Psychomotor Activity: Normal  Assets  Assets: Communication Skills; Desire for Improvement; Housing; Physical Health; Resilience; Social Support  Sleep  Sleep: Sleep: Fair Number of Hours of Sleep: 8.25  Physical Exam: Physical Exam Vitals and nursing note reviewed.  HENT:     Head: Normocephalic.     Nose: Nose normal.     Mouth/Throat:     Mouth: Mucous membranes are moist.  Eyes:     Extraocular Movements: Extraocular movements intact.  Cardiovascular:     Rate and Rhythm: Normal rate.     Pulses: Normal pulses.  Pulmonary:     Effort: Pulmonary effort is normal.  Abdominal:     Comments:  Deferred  Genitourinary:    Comments: Deferred Musculoskeletal:  General: Normal range of motion.     Cervical back: Normal range of motion.  Skin:    General: Skin is warm.  Neurological:     General: No focal deficit present.     Mental Status: She is alert and oriented to person, place, and time.  Psychiatric:        Behavior: Behavior normal.    Review of Systems  Constitutional:  Negative for chills and fever.  HENT:  Negative for sore throat.   Eyes:  Negative for blurred vision.  Respiratory:  Negative for cough, shortness of breath and wheezing.   Cardiovascular:  Negative for chest pain and palpitations.  Gastrointestinal:  Negative for abdominal pain, heartburn, nausea and vomiting.  Genitourinary:  Negative for dysuria, frequency and urgency.  Musculoskeletal: Negative.   Skin:  Negative for itching and rash.  Neurological:  Negative for dizziness, tingling, tremors, sensory change, speech change, focal weakness, seizures and headaches.  Endo/Heme/Allergies:        See allergy listing  Psychiatric/Behavioral:  Positive for depression. The patient is nervous/anxious and has insomnia.    Blood pressure 95/60, pulse 89, temperature 98 F (36.7 C), resp. rate 13, height 5\' 2"  (1.575 m), weight 93.7 kg, SpO2 98%. Body mass index is 37.79 kg/m.  COGNITIVE FEATURES THAT CONTRIBUTE TO RISK:  Polarized thinking    SUICIDE RISK:   Severe:  Frequent, intense, and enduring suicidal ideation, specific plan, no subjective intent, but some objective markers of intent (i.e., choice of lethal method), the method is accessible, some limited preparatory behavior, evidence of impaired self-control, severe dysphoria/symptomatology, multiple risk factors present, and few if any protective factors, particularly a lack of social support.  PLAN OF CARE:  Physician Treatment Plan for Primary Diagnosis:  Assessment: MDD (major depressive disorder), recurrent episode, severe  (HCC) SI GAD Alcohol use d/o  Plans: Medication: Continue Cymbalta DR capsule 60 mg p.o. daily for depression Continue Abilify 5 mg p.o. daily to augment antidepressant/mood stabilization Continue naltrexone tablet 50 mg p.o. daily for alcohol dependence Continue propranolol tablet 10 mg p.o. every 12 hours as needed for anxiety Continue trazodone tablet 50 mg p.o. at bedtime as needed for insomnia Continue hydroxyzine 25 mg tablet p.o. 3 times daily as needed for anxiety Multivitamin with minerals 1 tab p.o. daily for supplement Resume home medication tizanidine 4 mg p.o. daily for psoriatic arthritis  Agitation protocol: Benadryl capsule 50 mg p.o. or IM 3 times daily as needed agitation   Haldol tablets 5 mg po IM 3 times daily as needed agitation   Lorazepam tablet 2 mg p.o. or IM 3 times daily as needed agitation    Other PRN Medications -Acetaminophen 650 mg every 6 as needed/mild pain -Maalox 30 mL oral every 4 as needed/digestion -Magnesium hydroxide 30 mL daily as needed/mild constipation  -- The risks/benefits/side-effects/alternatives to this medication were discussed in detail with the patient and time was given for questions. The patient consents to medication trial.  -- Metabolic profile and EKG monitoring obtained while on an atypical antipsychotic (BMI: Lipid Panel: HbgA1c: QTc:)  -- Encouraged patient to participate in unit milieu and in scheduled group therapies   Admission lab reviewed:  CMP: WNL.  Lipid panel: LDL 114, otherwise normal.  CBC with differential: WNL.  Hemoglobin A1c 4.9, normal.  Glucose 92.  TSH 3.669.  BAL on 710 2468; on 12/04/2022 less than 10.    New labs ordered: None  EKG reviewed:  EKG reviewed: NSR, vent rate 66, QT/QTc  384/402.  Safety and Monitoring: Voluntary admission to inpatient psychiatric unit for safety, stabilization and treatment Daily contact with patient to assess and evaluate symptoms and progress in treatment Patient's  case to be discussed in multi-disciplinary team meeting Observation Level : q15 minute checks Vital signs: q12 hours Precautions: suicide, but pt currently verbally contracts for safety on unit    Discharge Planning: Social work and case management to assist with discharge planning and identification of hospital follow-up needs prior to discharge Estimated LOS: 5-7 days Discharge Concerns: Need to establish a safety plan; Medication compliance and effectiveness Discharge Goals: Return home with outpatient referrals for mental health follow-up including medication management/psychotherapy.  Long Term Goal(s): Improvement in symptoms so as ready for discharge  Short Term Goals: Ability to identify changes in lifestyle to reduce recurrence of condition will improve, Ability to verbalize feelings will improve, Ability to disclose and discuss suicidal ideas, Ability to demonstrate self-control will improve, Ability to identify and develop effective coping behaviors will improve, Ability to maintain clinical measurements within normal limits will improve, Compliance with prescribed medications will improve, and Ability to identify triggers associated with substance abuse/mental health issues will improve  Physician Treatment Plan for Secondary Diagnosis: Principal Problem:   MDD (major depressive disorder), recurrent episode, severe (HCC)  I certify that inpatient services furnished can reasonably be expected to improve the patient's condition.   Cecilie Lowers, FNP 12/06/2022, 4:53 PM
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
## 2022-12-06 NOTE — Group Note (Signed)
Recreation Therapy Group Note   Group Topic:Animal Assisted Therapy   Group Date: 12/06/2022 Start Time: 0945 End Time: 1030 Facilitators: Karson Reede-McCall, LRT,CTRS Location: [address] Dayroom   Animal-Assisted Activity (AAA) Program Checklist/Progress Notes Patient Eligibility Criteria Checklist & Daily Group note for Rec Tx Intervention  AAA/T Program Assumption of Risk Form signed by Patient/ or Parent Legal Guardian Yes  Patient is free of allergies or severe asthma Yes  Patient reports no fear of animals Yes  Patient reports no history of cruelty to animals Yes  Patient understands his/her participation is voluntary Yes  Patient washes hands before animal contact Yes  Patient washes hands after animal contact Yes   Affect/Mood: Appropriate   Participation Level: Engaged   Participation Quality: Independent   Behavior: Appropriate   Speech/Thought Process: Focused   Insight: Good   Judgement: Good   Modes of Intervention: Teaching laboratory technician   Patient Response to Interventions:  Engaged   Education Outcome:  In group clarification offered    Clinical Observations/Individualized Feedback: Patient attended session and interacted appropriately with therapy dog and peers. Patient asked appropriate questions about therapy dog and his training. Patient shared stories about their pets at home with group.     Plan: Continue to engage patient in RT group sessions 2-3x/week.   Christina Hull, LRT,CTRS  12/06/2022 12:58 PM

## 2022-12-06 NOTE — Plan of Care (Signed)
  Problem: Education: Goal: Emotional status will improve Outcome: Progressing Goal: Mental status will improve Outcome: Progressing   Problem: Activity: Goal: Interest or engagement in activities will improve Outcome: Progressing Goal: Sleeping patterns will improve Outcome: Progressing

## 2022-12-06 NOTE — B[facility] Group Notes (Signed)
Adult Psychoeducational Group Note  Date:  12/06/2022 Time:  9:48 PM  Group Topic/Focus:  Wrap-Up Group:   The focus of this group is to help patients review their daily goal of treatment and discuss progress on daily workbooks.  Participation Level:  Active  Participation Quality:  Appropriate  Affect:  Appropriate  Cognitive:  Appropriate  Insight: Appropriate  Engagement in Group:  Engaged  Modes of Intervention:  Discussion  Additional Comments:  Pt stated day was a 7,stated today's goal was to meet with team and stay busy, Pt stated goal was achieved. Pt stated they like their diagnoses to be share with all "my doctors".  Joselyn Arrow 12/06/2022, 9:48 PM

## 2022-12-07 ENCOUNTER — Encounter (HOSPITAL_COMMUNITY): Payer: Self-pay

## 2022-12-07 DIAGNOSIS — F332 Major depressive disorder, recurrent severe without psychotic features: Secondary | ICD-10-CM | POA: Diagnosis not present

## 2022-12-07 NOTE — Progress Notes (Signed)
Adult Psychoeducational Group Note  Date:  12/07/2022 Time:  9:35 PM  Group Topic/Focus:  Wrap-Up Group:   The focus of this group is to help patients review their daily goal of treatment and discuss progress on daily workbooks.  Participation Level:  Active  Participation Quality:  Appropriate  Affect:  Appropriate  Cognitive:  Appropriate  Insight: Appropriate  Engagement in Group:  Engaged  Modes of Intervention:  Activity  Additional Comments:  Pt attended and participated in NA group.  Glorine Hanratty Katrinka Blazing 12/07/2022, 9:35 PM

## 2022-12-07 NOTE — Plan of Care (Signed)
Problem: Education: Goal: Emotional status will improve Outcome: Progressing Goal: Mental status will improve Outcome: Progressing   Problem: Activity: Goal: Interest or engagement in activities will improve Outcome: Progressing Goal: Sleeping patterns will improve Outcome: Progressing

## 2022-12-07 NOTE — BH IP Treatment Plan (Signed)
Interdisciplinary Treatment and Diagnostic Plan Initial  12/07/2022 Time of Session: 1010 Christina Hull MRN: [id number]  Principal Diagnosis: MDD (major depressive disorder), recurrent episode, severe (HCC)  Secondary Diagnoses: Principal Problem:   MDD (major depressive disorder), recurrent episode, severe (HCC)   Current Medications:  Current Facility-Administered Medications  Medication Dose Route Frequency Provider Last Rate Last Admin   acetaminophen (TYLENOL) tablet 650 mg  650 mg Oral Q6H PRN Ardis Hughs, NP       alum & mag hydroxide-simeth (MAALOX/MYLANTA) 200-200-20 MG/5ML suspension 30 mL  30 mL Oral Q4H PRN Ardis Hughs, NP       ARIPiprazole (ABILIFY) tablet 5 mg  5 mg Oral QHS Ntuen, Tina C, FNP   5 mg at 12/06/22 2053   diphenhydrAMINE (BENADRYL) capsule 50 mg  50 mg Oral TID PRN Ardis Hughs, NP       Or   diphenhydrAMINE (BENADRYL) injection 50 mg  50 mg Intramuscular TID PRN Ardis Hughs, NP       DULoxetine (CYMBALTA) DR capsule 60 mg  60 mg Oral Daily Ntuen, Tina C, FNP   60 mg at 12/07/22 0932   haloperidol (HALDOL) tablet 5 mg  5 mg Oral TID PRN Ardis Hughs, NP       Or   haloperidol lactate (HALDOL) injection 5 mg  5 mg Intramuscular TID PRN Ardis Hughs, NP       hydrOXYzine (ATARAX) tablet 25 mg  25 mg Oral TID PRN Ardis Hughs, NP   25 mg at 12/06/22 2053   LORazepam (ATIVAN) tablet 2 mg  2 mg Oral TID PRN Ardis Hughs, NP       Or   LORazepam (ATIVAN) injection 2 mg  2 mg Intramuscular TID PRN Ardis Hughs, NP       magnesium hydroxide (MILK OF MAGNESIA) suspension 30 mL  30 mL Oral Daily PRN Ardis Hughs, NP       multivitamin with minerals tablet 1 tablet  1 tablet Oral Daily Ardis Hughs, NP   1 tablet at 12/07/22 0742   naltrexone (DEPADE) tablet 50 mg  50 mg Oral Daily Ardis Hughs, NP   50 mg at 12/07/22 0742   propranolol (INDERAL) tablet 10 mg  10 mg Oral Q12H PRN Ardis Hughs, NP       tiZANidine (ZANAFLEX) tablet 4 mg  4 mg Oral QHS Ntuen, Tina C, FNP   4 mg at 12/06/22 2053   traZODone (DESYREL) tablet 50 mg  50 mg Oral QHS PRN Ardis Hughs, NP   50 mg at 12/06/22 2052   PTA Medications: Medications Prior to Admission  Medication Sig Dispense Refill Last Dose   adalimumab (HUMIRA, 2 PEN,) 40 MG/0.4ML pen Inject 40 mg into the skin every 14 (fourteen) days.      ARIPiprazole (ABILIFY) 2 MG tablet Take 1 tablet (2 mg total) by mouth daily. (Patient taking differently: Take 4 mg by mouth at bedtime.) 30 tablet 0    DULoxetine (CYMBALTA) 60 MG capsule Take 1 capsule (60 mg total) by mouth daily. (Patient taking differently: Take 60 mg by mouth daily. Take with a 30mg  capsule for a total of 90mg .) 30 capsule 0    hydrOXYzine (ATARAX) 10 MG tablet Take 1 tablet (10 mg total) by mouth 3 (three) times daily as needed for anxiety. (Patient taking differently: Take 10 mg by mouth 2 (two) times daily as needed for anxiety.)  30 tablet 0    Multiple Vitamin (MULTIVITAMIN WITH MINERALS) TABS tablet Take 1 tablet by mouth daily.      naltrexone (DEPADE) 50 MG tablet Take 50 mg by mouth daily.      propranolol (INDERAL) 10 MG tablet Take 1 tablet (10 mg total) by mouth every 12 (twelve) hours. (Patient taking differently: Take 10 mg by mouth 2 (two) times daily as needed (For anxiety).) 60 tablet 0    tiZANidine (ZANAFLEX) 4 MG tablet TAKE ONE TABLET BY MOUTH EVERY 6 HOURS AS NEEDED FOR MUSCLE SPASMS (Patient taking differently: Take 4 mg by mouth at bedtime as needed for muscle spasms.) 60 tablet 0    traZODone (DESYREL) 50 MG tablet Take 1 tablet (50 mg total) by mouth at bedtime as needed for sleep. 30 tablet 0     Patient Stressors: Loss of relationship with AA sponsor     Patient Strengths: General fund of knowledge  Motivation for treatment/growth  Supportive family/friends   Treatment Modalities: Medication Management, Group therapy, Case management,  1  to 1 session with clinician, Psychoeducation, Recreational therapy.   Physician Treatment Plan for Primary Diagnosis: MDD (major depressive disorder), recurrent episode, severe (HCC) Long Term Goal(s): Improvement in symptoms so as ready for discharge   Short Term Goals: Ability to identify changes in lifestyle to reduce recurrence of condition will improve Ability to verbalize feelings will improve Ability to disclose and discuss suicidal ideas Ability to demonstrate self-control will improve Ability to identify and develop effective coping behaviors will improve Ability to maintain clinical measurements within normal limits will improve Compliance with prescribed medications will improve Ability to identify triggers associated with substance abuse/mental health issues will improve  Medication Management: Evaluate patient's response, side effects, and tolerance of medication regimen.  Therapeutic Interventions: 1 to 1 sessions, Unit Group sessions and Medication administration.  Evaluation of Outcomes: Progressing  Physician Treatment Plan for Secondary Diagnosis: Principal Problem:   MDD (major depressive disorder), recurrent episode, severe (HCC)  Long Term Goal(s): Improvement in symptoms so as ready for discharge   Short Term Goals: Ability to identify changes in lifestyle to reduce recurrence of condition will improve Ability to verbalize feelings will improve Ability to disclose and discuss suicidal ideas Ability to demonstrate self-control will improve Ability to identify and develop effective coping behaviors will improve Ability to maintain clinical measurements within normal limits will improve Compliance with prescribed medications will improve Ability to identify triggers associated with substance abuse/mental health issues will improve     Medication Management: Evaluate patient's response, side effects, and tolerance of medication regimen.  Therapeutic  Interventions: 1 to 1 sessions, Unit Group sessions and Medication administration.  Evaluation of Outcomes: Progressing   RN Treatment Plan for Primary Diagnosis: MDD (major depressive disorder), recurrent episode, severe (HCC) Long Term Goal(s): Knowledge of disease and therapeutic regimen to maintain health will improve  Short Term Goals: Ability to remain free from injury will improve, Ability to verbalize frustration and anger appropriately will improve, Ability to demonstrate self-control, Ability to participate in decision making will improve, Ability to verbalize feelings will improve, Ability to disclose and discuss suicidal ideas, Ability to identify and develop effective coping behaviors will improve, and Compliance with prescribed medications will improve  Medication Management: RN will administer medications as ordered by provider, will assess and evaluate patient's response and provide education to patient for prescribed medication. RN will report any adverse and/or side effects to prescribing provider.  Therapeutic Interventions: 1 on 1 counseling  sessions, Psychoeducation, Medication administration, Evaluate responses to treatment, Monitor vital signs and CBGs as ordered, Perform/monitor CIWA, COWS, AIMS and Fall Risk screenings as ordered, Perform wound care treatments as ordered.  Evaluation of Outcomes: Progressing   LCSW Treatment Plan for Primary Diagnosis: MDD (major depressive disorder), recurrent episode, severe (HCC) Long Term Goal(s): Safe transition to appropriate next level of care at discharge, Engage patient in therapeutic group addressing interpersonal concerns.  Short Term Goals: Engage patient in aftercare planning with referrals and resources, Increase social support, Increase ability to appropriately verbalize feelings, Increase emotional regulation, Facilitate acceptance of mental health diagnosis and concerns, Facilitate patient progression through stages of  change regarding substance use diagnoses and concerns, Identify triggers associated with mental health/substance abuse issues, and Increase skills for wellness and recovery  Therapeutic Interventions: Assess for all discharge needs, 1 to 1 time with Social worker, Explore available resources and support systems, Assess for adequacy in community support network, Educate family and significant other(s) on suicide prevention, Complete Psychosocial Assessment, Interpersonal group therapy.  Evaluation of Outcomes: Progressing   Progress in Treatment: Attending groups: Yes. Participating in groups: Yes. Taking medication as prescribed: Yes. Toleration medication: Yes. Family/Significant other contact made: Yes, individual(s) contacted:  Casimiro Needle (husband) [phone] Patient understands diagnosis: Yes. Discussing patient identified problems/goals with staff: Yes. Medical problems stabilized or resolved: Yes. Denies suicidal/homicidal ideation: Yes. Issues/concerns per patient self-inventory: Yes. Other: N/A  New problem(s) identified: No, Describe:  None reported  New Short Term/Long Term Goal(s): medication stabilization, elimination of SI thoughts, development of comprehensive mental wellness plan.   Patient Goals:  Medication Stabilization  Discharge Plan or Barriers: :  Patient recently admitted. CSW will continue to follow and assess for appropriate referrals and possible discharge planning.   Reason for Continuation of Hospitalization: Depression Medication stabilization Suicidal ideation  Estimated Length of Stay: 5-7 Days  Last 3 [place] Suicide Severity Risk Score: Flowsheet Row [facility] (Current[facility]2022 [facility] (Discharged) [facility]  C-SSRS RISK CATEGORY Moderate Risk High Risk High Risk       Last  PHQ 2/9 Scores:    06/15/2022    9:30 AM 04/12/2022    3:14 PM 02/01/2022    8:16 AM  Depression screen PHQ 2/9  Decreased Interest 0 3 0  Down, Depressed, Hopeless 0 3 0  PHQ - 2 Score 0 6 0  Altered sleeping  2   Tired, decreased energy  3   Change in appetite  3   Feeling bad or failure about yourself   3   Trouble concentrating  2   Moving slowly or fidgety/restless  0   Suicidal thoughts  3   PHQ-9 Score  22   Difficult doing work/chores  Somewhat difficult    medication stabilization, elimination of SI thoughts, development of comprehensive mental wellness plan.   Scribe for Treatment Team: Ane Payment, LCSW 12/07/2022 1:22 PM

## 2022-12-07 NOTE — [facility] Group Notes (Signed)
Group Topic/Focus:  Goals Group:   The focus of this group is to help patients establish daily goals to achieve during treatment and discuss how the patient can incorporate goal setting into their daily lives to aide in recovery.       Participation Level:  Active   Participation Quality:  Attentive   Affect:  Appropriate   Cognitive:  Appropriate   Insight: Appropriate   Engagement in Group:  Engaged   Modes of Intervention:  Discussion   Additional Comments:   Patient attended goals group and was attentive the duration of it. Patient's goal was is to stay busy, finish her puzzle, Ask about when she can discharge.

## 2022-12-07 NOTE — Progress Notes (Signed)
   12/07/22 1000  Psych Admission Type (Psych Patients Only)  Admission Status Voluntary  Psychosocial Assessment  Patient Complaints Anxiety  Eye Contact Fair  Facial Expression Animated  Affect Anxious  Speech Logical/coherent  Interaction Assertive  Motor Activity Other (Comment) (WDL)  Appearance/Hygiene Unremarkable  Behavior Characteristics Cooperative;Appropriate to situation  Mood Anxious;Depressed  Thought Process  Coherency WDL  Content WDL  Delusions None reported or observed  Perception WDL  Hallucination None reported or observed  Judgment Impaired  Confusion None  Danger to Self  Current suicidal ideation? Passive  Description of Suicide Plan No plan  Self-Injurious Behavior Some self-injurious ideation observed or expressed.  No lethal plan expressed   Agreement Not to Harm Self Yes  Description of Agreement Verbal  Danger to Others  Danger to Others None reported or observed

## 2022-12-07 NOTE — Group Note (Signed)
Recreation Therapy Group Note   Group Topic:Communication  Group Date: 12/07/2022 Start Time: 0932 End Time: 1005 Facilitators: Jonavon Trieu-McCall, LRT,CTRS Location: 300 Hall Dayroom   Group Topic: Communication, Problem Solving   Goal Area(s) Addresses:  Patient will effectively listen to complete activity.  Patient will identify communication skills used to make activity successful.  Patient will identify how skills used during activity can be used to reach post d/c goals.    Intervention: Building surveyor Activity - Geometric pattern cards, pencils, blank paper    Group Description: Geometric Drawings.  Three volunteers from the peer group will be shown an abstract picture with a particular arrangement of geometrical shapes.  Each round, one 'speaker' will describe the pattern, as accurately as possible without revealing the image to the group.  The remaining group members will listen and draw the picture to reflect how it is described to them. Patients with the role of 'listener' cannot ask clarifying questions but, may request that the speaker repeat a direction. Once the drawings are complete, the presenter will show the rest of the group the picture and compare how close each person came to drawing the picture. LRT will facilitate a post-activity discussion regarding effective communication and the importance of planning, listening, and asking for clarification in daily interactions with others.  Education: Environmental consultant, Active listening, Support systems, Discharge planning  Education Outcome: Acknowledges understanding/In group clarification offered/Needs additional education.    Affect/Mood: Appropriate   Participation Level: Engaged   Participation Quality: Independent   Behavior: Appropriate   Speech/Thought Process: Focused   Insight: Good   Judgement: Good   Modes of Intervention: Group work   Patient Response to Interventions:  Engaged    Education Outcome:  In group clarification offered    Clinical Observations/Individualized Feedback: Pt was bright and engaged. Pt was focused and attentive to the descriptions that were given during group. Pt was also the last presenter in the activity. Pt was detailed and made sure she took her time to make sure peers were given the chance to draw the picture as she described it.     Plan: Continue to engage patient in RT group sessions 2-3x/week.   Katha Kuehne-McCall, LRT,CTRS 12/07/2022 12:15 PM

## 2022-12-07 NOTE — [facility] Suicide Risk Assessment (Signed)
[facility] INPATIENT:  Family/Significant Other Suicide Prevention Education  Suicide Prevention Education:  Education Completed; Casimiro Needle (husband) [phone], has been identified by the patient as the family member/significant other with whom the patient will be residing, and identified as the person(s) who will aid the patient in the event of a mental health crisis (suicidal ideations/suicide attempt).  With written consent from the patient, the family member/significant other has been provided the following suicide prevention education, prior to the and/or following the discharge of the patient.  The suicide prevention education provided includes the following: Suicide risk factors Suicide prevention and interventions National Suicide Hotline telephone number [facility] assessment telephone number [facility] Emergency Assistance [address] [facility] and/or Residential Mobile Crisis Unit telephone number  Request made of family/significant other to: Remove weapons (e.g., guns, rifles, knives), all items previously/currently identified as safety concern.   Remove drugs/medications (over-the-counter, prescriptions, illicit drugs), all items previously/currently identified as a safety concern.  The family member/significant other verbalizes understanding of the suicide prevention education information provided.  The family member/significant other agrees to remove the items of safety concern listed above.  Christina Hull 12/07/2022, 3:21 PM

## 2022-12-07 NOTE — [facility] Group Notes (Signed)
Spirituality group facilitated by Kathleen Argue, BCC.  Group Description: Group focused on topic of hope. Patients participated in facilitated discussion around topic, connecting with one another around experiences and definitions for hope. Group members engaged with visual explorer photos, reflecting on what hope looks like for them today. Group engaged in discussion around how their definitions of hope are present today in hospital.  Modalities: Psycho-social ed, Adlerian, Narrative, [place]  Patient Progress: Christina Hull attended group and actively engaged and participated in group conversation and activities.  Her comments demonstrated good insight into the topic and she was supportive of peers.

## 2022-12-07 NOTE — Progress Notes (Signed)
[facility] MD Progress Note  12/07/2022 3:10 PM Christina Hull  MRN:  [id number]  Subjective:    Christina Hull is a 32 year old Caucasian female with prior psychiatric history significant for MDD, generalized anxiety disorder, and alcohol use disorder who presents voluntarily to [facility] from [facility] for worsening depression x 1 week, resulting in suicidal ideation with several plans to go to her father's house and shot herself with a gun, driving to crash her car, or go [place] the [place] and jump [place] the [place] in the context of no specific stressors.  This is the patient third admission to [facility] for treatment of her mental illness.  After medical evaluation/stabilization & clearance, she was transferred to the [facility] for further psychiatric evaluation & treatments.   Yesterday the psychiatry team made the following recommendations: Decrease Cymbalta from 90 mg to 60 mg p.o. daily for depression Increase Abilify from 4 mg to 5 mg p.o. daily to augment antidepressant/mood stabilization Continue naltrexone tablet 50 mg p.o. daily for alcohol dependence Continue propranolol tablet 10 mg p.o. every 12 hours as needed for anxiety Continue trazodone tablet 50 mg p.o. at bedtime as needed for insomnia Continue hydroxyzine 25 mg tablet p.o. 3 times daily as needed for anxiety Multivitamin with minerals 1 tab p.o. daily for supplement Resume home medication tizanidine 4 mg p.o. daily for psoriatic arthritis   On assessment today, the pt reports that their mood is still depressed and down, without significant change from yesterday.  Reports that anxiety is elevated due to worry about relationship stress she perceives that she caused with the husband.  Sleep is better. Appetite is stable. Concentration is better.  Energy level is low, napping throughout the day, having difficulty participating in groups.  Reports having passive suicidal thoughts w/o intent or plan.. denies having any  suicidal intent and plan.  Denies having any HI.  Denies having psychotic symptoms.   Denies having side effects to current psychiatric medications.   Discussed the following psychosocial stressors: relationship with husband, she feels ashamed that she needs his support.     We discussed getting bdt after dc.  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    Principal Problem: MDD (major depressive disorder), recurrent episode, severe (HCC) Diagnosis: Principal Problem:   MDD (major depressive disorder), recurrent episode, severe (HCC) Active Problems:   Alcohol use disorder   GAD (generalized anxiety disorder)  Total Time spent with patient: 20 minutes  Past Psychiatric History:  Previous Psych Diagnoses: MDD, GAD, alcohol use disorder and history, history of borderline personality Prior inpatient treatment: Yes, at [facility] on January 2024, & July 2024 [facility] for therapy and medication management Prior rehab hx: Goes to IOP at [facility]. Psychotherapy hx: Yes History of suicide: Yes, January 2024 attempted to stab self.  July 2024 and September 2024 had multiple plans for suicide. History of homicide or aggression: Denies Psychiatric medication history: Patient has been on trial Cymbalta, Abilify, hydroxyzine, naltrexone, propranolol, trazodone, tizanidine.   Psychiatric medication compliance history:Taking her medication as ordered.  Compliance Neuromodulation history: Denies Current [facility] and Current therapist[facility]  Past Medical History:  Past Medical History:  Diagnosis Date   PCOS (polycystic ovarian syndrome)    History reviewed. No pertinent surgical history. Family History:  Family History  Problem Relation Age of Onset  Cancer Other    Heart disease Other    Depression Mother    Cancer  Mother    COPD Mother    Arthritis Mother    Bipolar disorder Mother    Cancer Paternal Grandmother    Family Psychiatric  History: See H&P   Social History:  Social History   Substance and Sexual Activity  Alcohol Use None     Social History   Substance and Sexual Activity  Drug Use Not on file    Social History   Socioeconomic History   Marital status: Married    Spouse name: Not on file   Number of children: Not on file   Years of education: Not on file   Highest education level: Not on file  Occupational History   Not on file  Tobacco Use   Smoking status: Former    Types: Cigarettes    Passive exposure: Past   Smokeless tobacco: Never  Substance and Sexual Activity   Alcohol use: Not on file   Drug use: Not on file   Sexual activity: Not on file  Other Topics Concern   Not on file  Social History Narrative   ** Merged History Encounter **       Social Determinants of Health   Financial Resource Strain: Not on file  Food Insecurity: No Food Insecurity (12/05/2022)   Hunger Vital Sign    Worried About Running Out of Food in the Last Year: Never true    Ran Out of Food in the Last Year: Never true  Transportation Needs: No Transportation Needs (12/05/2022)   PRAPARE - Administrator, Civil Service (Medical): No    Lack of Transportation (Non-Medical): No  Physical Activity: Not on file  Stress: Not on file  Social Connections: Unknown (07/26/2021)   Received from [facility]   Social Network    Social Network: Not on file   Additional Social History:                          Current Medications: Current Facility-Administered Medications  Medication Dose Route Frequency Provider Last Rate Last Admin   acetaminophen (TYLENOL) tablet 650 mg  650 mg Oral Q6H PRN Ardis Hughs, NP       alum & mag hydroxide-simeth (MAALOX/MYLANTA) 200-200-20 MG/5ML suspension 30 mL  30 mL Oral Q4H PRN Ardis Hughs, NP        ARIPiprazole (ABILIFY) tablet 5 mg  5 mg Oral QHS Ntuen, Tina C, FNP   5 mg at 12/06/22 2053   diphenhydrAMINE (BENADRYL) capsule 50 mg  50 mg Oral TID PRN Ardis Hughs, NP       Or   diphenhydrAMINE (BENADRYL) injection 50 mg  50 mg Intramuscular TID PRN Ardis Hughs, NP       DULoxetine (CYMBALTA) DR capsule 60 mg  60 mg Oral Daily Ntuen, Jesusita Oka, FNP   60 mg at 12/07/22 1610   haloperidol (HALDOL) tablet 5 mg  5 mg Oral TID PRN Ardis Hughs, NP       Or   haloperidol lactate (HALDOL) injection 5 mg  5 mg Intramuscular TID PRN Ardis Hughs, NP       hydrOXYzine (ATARAX) tablet 25 mg  25 mg Oral TID PRN Ardis Hughs, NP   25 mg at 12/06/22 2053   LORazepam (ATIVAN) tablet 2 mg  2 mg Oral TID PRN Effie Shy,  Liane Comber, NP       Or   LORazepam (ATIVAN) injection 2 mg  2 mg Intramuscular TID PRN Ardis Hughs, NP       magnesium hydroxide (MILK OF MAGNESIA) suspension 30 mL  30 mL Oral Daily PRN Ardis Hughs, NP       multivitamin with minerals tablet 1 tablet  1 tablet Oral Daily Ardis Hughs, NP   1 tablet at 12/07/22 0742   naltrexone (DEPADE) tablet 50 mg  50 mg Oral Daily Ardis Hughs, NP   50 mg at 12/07/22 0742   propranolol (INDERAL) tablet 10 mg  10 mg Oral Q12H PRN Ardis Hughs, NP       tiZANidine (ZANAFLEX) tablet 4 mg  4 mg Oral QHS Ntuen, Jesusita Oka, FNP   4 mg at 12/06/22 2053   traZODone (DESYREL) tablet 50 mg  50 mg Oral QHS PRN Ardis Hughs, NP   50 mg at 12/06/22 2052    Lab Results: No results found for this or any previous visit (from the past 48 hour(s)).  Blood Alcohol level:  Lab Results  Component Value Date   ETH <10 12/04/2022   ETH 68 (H) 09/21/2022    Metabolic Disorder Labs: Lab Results  Component Value Date   HGBA1C 4.9 12/04/2022   MPG 93.93 12/04/2022   MPG 91.06 04/12/2022   Lab Results  Component Value Date   PROLACTIN 10.9 04/12/2022   Lab Results  Component Value Date   CHOL 190  12/04/2022   TRIG 74 12/04/2022   HDL 61 12/04/2022   CHOLHDL 3.1 12/04/2022   VLDL 15 12/04/2022   LDLCALC 114 (H) 12/04/2022   LDLCALC 108 (H) 04/12/2022    Physical Findings: AIMS:  , ,  ,  ,    CIWA:    COWS:     Musculoskeletal: Strength & Muscle Tone: within normal limits Gait & Station: normal Patient leans: N/A  Psychiatric Specialty Exam:  Presentation  General Appearance:  Fairly Groomed  Eye Contact: Good  Speech: Normal Rate  Speech Volume: Decreased  Handedness: Right   Mood and Affect  Mood: Anxious; Depressed  Affect: Congruent; Depressed   Thought Process  Thought Processes: Linear  Descriptions of Associations:Intact  Orientation:Full (Time, Place and Person)  Thought Content:Logical  History of Schizophrenia/Schizoaffective disorder:No  Duration of Psychotic Symptoms:No data recorded Hallucinations:Hallucinations: None  Ideas of Reference:None  Suicidal Thoughts:Suicidal Thoughts: Yes, Passive SI Passive Intent and/or Plan: Without Intent; Without Plan  Homicidal Thoughts:Homicidal Thoughts: No   Sensorium  Memory: Immediate Good; Recent Good; Remote Good  Judgment: Fair  Insight: Fair   Chartered certified accountant: Fair  Attention Span: Fair  Recall: Good  Fund of Knowledge: Good  Language: Good   Psychomotor Activity  Psychomotor Activity: Psychomotor Activity: Normal   Assets  Assets: Communication Skills; Desire for Improvement; Housing; Physical Health; Resilience; Social Support   Sleep  Sleep: Sleep: Fair Number of Hours of Sleep: 8.25    Physical Exam: Physical Exam Vitals reviewed.  Constitutional:      General: She is not in acute distress.    Appearance: She is not toxic-appearing.  Pulmonary:     Effort: Pulmonary effort is normal.  Neurological:     Mental Status: She is alert.     Motor: No weakness.     Gait: Gait normal.  Psychiatric:        Behavior:  Behavior normal.  Judgment: Judgment normal.    Review of Systems  Constitutional:  Negative for chills and fever.  Cardiovascular:  Negative for chest pain and palpitations.  Neurological:  Negative for dizziness, tingling, tremors and headaches.  Psychiatric/Behavioral:  Positive for depression, substance abuse and suicidal ideas. Negative for hallucinations and memory loss. The patient is nervous/anxious. The patient does not have insomnia.   All other systems reviewed and are negative.  Blood pressure 96/65, pulse (!) 107, temperature 98.9 F (37.2 C), temperature source Oral, resp. rate 13, height 5\' 2"  (1.575 m), weight 93.7 kg, SpO2 98%. Body mass index is 37.79 kg/m.   Treatment Plan Summary: Daily contact with patient to assess and evaluate symptoms and progress in treatment and Medication management  Assessment: MDD (major depressive disorder), recurrent episode, severe (HCC) GAD Alcohol use d/o Borderline PD   Plan:  Medication: Continue Cymbalta DR capsule 60 mg p.o. daily for depression Continue Abilify 5 mg p.o. daily to augment antidepressant/mood stabilization Continue naltrexone tablet 50 mg p.o. daily for alcohol dependence Continue trazodone tablet 50 mg p.o. at bedtime as needed for insomnia Continue hydroxyzine 25 mg tablet p.o. 3 times daily as needed for anxiety   Agitation protocol: Benadryl capsule 50 mg p.o. or IM 3 times daily as needed agitation   Haldol tablets 5 mg po IM 3 times daily as needed agitation   Lorazepam tablet 2 mg p.o. or IM 3 times daily as needed agitation     Other PRN Medications -Acetaminophen 650 mg every 6 as needed/mild pain -Maalox 30 mL oral every 4 as needed/digestion -Magnesium hydroxide 30 mL daily as needed/mild constipation   -- The risks/benefits/side-effects/alternatives to this medication were discussed in detail with the patient and time was given for questions. The patient consents to medication trial.   -- Metabolic profile and EKG monitoring obtained while on an atypical antipsychotic (BMI: Lipid Panel: HbgA1c: QTc:)  -- Encouraged patient to participate in unit milieu and in scheduled group therapies    Admission lab reviewed:  CMP: WNL.  Lipid panel: LDL 114, otherwise normal.  CBC with differential: WNL.  Hemoglobin A1c 4.9, normal.  Glucose 92.  TSH 3.669.  BAL on 710 2468; on 12/04/2022 less than 10.     New labs ordered: None   EKG reviewed:  EKG reviewed: NSR, vent rate 66, QT/QTc 384/402.   Safety and Monitoring: Voluntary admission to inpatient psychiatric unit for safety, stabilization and treatment Daily contact with patient to assess and evaluate symptoms and progress in treatment Patient's case to be discussed in multi-disciplinary team meeting Observation Level : q15 minute checks Vital signs: q12 hours Precautions: suicide, but pt currently verbally contracts for safety on unit    Discharge Planning: Social work and case management to assist with discharge planning and identification of hospital follow-up needs prior to discharge Estimated LOS: 5-7 days Discharge Concerns: Need to establish a safety plan; Medication compliance and effectiveness Discharge Goals: Return home with outpatient referrals for mental health follow-up including medication management/psychotherapy.    Cristy Hilts, MD 12/07/2022, 3:10 PM  Total Time Spent in Direct Patient Care:  I personally spent 25 minutes on the unit in direct patient care. The direct patient care time included face-to-face time with the patient, reviewing the patient's chart, communicating with other professionals, and coordinating care. Greater than 50% of this time was spent in counseling or coordinating care with the patient regarding goals of hospitalization, psycho-education, and discharge planning needs.   Phineas Inches, MD Psychiatrist

## 2022-12-08 DIAGNOSIS — F332 Major depressive disorder, recurrent severe without psychotic features: Secondary | ICD-10-CM | POA: Diagnosis not present

## 2022-12-08 NOTE — Plan of Care (Signed)
  Problem: Education: Goal: Knowledge of [place] [facility] Education information/materials will improve Outcome: Progressing Goal: Emotional status will improve Outcome: Progressing Goal: Mental status will improve Outcome: Progressing   

## 2022-12-08 NOTE — Progress Notes (Addendum)
D. Pt is friendly upon approach- has been visible in the milieu, observed attending groups. Per pt's self inventory, pt rated her depression,hopelessness and anxiety a 0/2/2, respectively. Pt's stated goal today is "to stay busy and start working on my discharge." Pt continues to endorse passive SI- without a plan- but stated, "this never goes away." Pt agreed to contact staff before acting on any harmful thoughts.  Pt denied A/VH and doesn't appear to be responding to internal stimuli. A. Labs and vitals monitored. Pt given and educated on medications. Pt supported emotionally and encouraged to express concerns and ask questions.   R. Pt remains safe with 15 minute checks. Will continue POC.    12/08/22 1100  Psych Admission Type (Psych Patients Only)  Admission Status Voluntary  Psychosocial Assessment  Patient Complaints Anxiety  Eye Contact Fair  Facial Expression Animated  Affect Appropriate to circumstance  Speech Logical/coherent  Interaction Assertive  Motor Activity Other (Comment) (steady gait)  Appearance/Hygiene Unremarkable  Behavior Characteristics Cooperative;Appropriate to situation  Mood Anxious;Depressed  Thought Process  Coherency WDL  Content WDL  Delusions None reported or observed  Perception WDL  Hallucination None reported or observed  Judgment Impaired  Confusion None  Danger to Self  Current suicidal ideation? Passive  Description of Suicide Plan no plan  Agreement Not to Harm Self Yes  Description of Agreement verbal contract for safety  Danger to Others  Danger to Others None reported or observed

## 2022-12-08 NOTE — Progress Notes (Addendum)
[facility] MD Progress Note  12/08/2022 3:32 PM Christina Hull  MRN:  [id number]  Subjective:     Christina Hull is a 32 year old Caucasian female with prior psychiatric history significant for MDD, generalized anxiety disorder, and alcohol use disorder who presents voluntarily to [facility] from [facility] for worsening depression x 1 week, resulting in suicidal ideation with several plans to go to her father's house and shot herself with a gun, driving to crash her car, or go to the [place] and jump [place] the cliff in the context of no specific stressors. This is the patient third admission to [facility] for treatment of her mental illness. After medical evaluation/stabilization & clearance, she was transferred to the [facility] for further psychiatric evaluation & treatments.   Yesterday the psychiatry team made the following recommendations: Continue Cymbalta DR capsule 60 mg p.o. daily for depression Continue Abilify 5 mg p.o. daily to augment antidepressant/mood stabilization Continue naltrexone tablet 50 mg p.o. daily for alcohol dependence  On assessment today, the pt reports that their mood is euthymic, improved since admission, and stable. Denies feeling down, depressed, or sad. Reports she had a good visit with her mother last night, with whom she recently reconnected, and this went well. Reports having talked with husband on the phone and they are on good terms.  Reports that anxiety symptoms are at manageable level.  Sleep is better but still feeling more tired than usual. Appetite is stable.  Concentration is without complaint.  Energy level is adequate. Reports that SI are chronic and at baseline level in regards to frequency and intensity, , w/o intent or plan. Denies having any suicidal intent and plan.  Denies having any HI.  Denies having psychotic symptoms.   Denies having side effects to current psychiatric medications.   Discussed discharge planning - discussed dc  as early as tomorrow once we have phone family meeting with her husband planned for tomorrow morning.      Principal Problem: MDD (major depressive disorder), recurrent episode, severe (HCC) Diagnosis: Principal Problem:   MDD (major depressive disorder), recurrent episode, severe (HCC) Active Problems:   Borderline personality disorder (HCC)   Alcohol use disorder   GAD (generalized anxiety disorder)  Total Time spent with patient: 20 minutes  Past Psychiatric History:  Previous Psych Diagnoses: MDD, GAD, alcohol use disorder and history, history of borderline personality Prior inpatient treatment: Yes, at [facility] on January 2024, & July 2024 [facility] therapy and medication management Prior rehab hx: Goes to IOP at [facility]. Psychotherapy hx: Yes History of suicide: Yes, January 2024 attempted to stab self.  July 2024 and September 2024 had multiple plans for suicide. History of homicide or aggression: Denies Psychiatric medication history: Patient has been on trial Cymbalta, Abilify, hydroxyzine, naltrexone, propranolol, trazodone, tizanidine.   Psychiatric medication compliance history:Taking her medication as ordered.  Compliance Neuromodulation history: Denies Current [facility] and [facility] therapist[facility]  Past Medical History:  Past Medical History:  Diagnosis Date   PCOS (polycystic ovarian syndrome)    History reviewed. No pertinent surgical history. Family History:  Family History  Problem Relation Age of Onset   Cancer Other    Heart disease Other    Depression Mother    Cancer Mother    COPD Mother    Arthritis Mother    Bipolar disorder Mother  Cancer Paternal Grandmother    Family Psychiatric  History: See H&P   Social History:  Social History   Substance and Sexual Activity  Alcohol Use None      Social History   Substance and Sexual Activity  Drug Use Not on file    Social History   Socioeconomic History   Marital status: Married    Spouse name: Not on file   Number of children: Not on file   Years of education: Not on file   Highest education level: Not on file  Occupational History   Not on file  Tobacco Use   Smoking status: Former    Types: Cigarettes    Passive exposure: Past   Smokeless tobacco: Never  Substance and Sexual Activity   Alcohol use: Not on file   Drug use: Not on file   Sexual activity: Not on file  Other Topics Concern   Not on file  Social History Narrative   ** Merged History Encounter **       Social Determinants of Health   Financial Resource Strain: Not on file  Food Insecurity: No Food Insecurity (12/05/2022)   Hunger Vital Sign    Worried About Running Out of Food in the Last Year: Never true    Ran Out of Food in the Last Year: Never true  Transportation Needs: No Transportation Needs (12/05/2022)   PRAPARE - Administrator, Civil Service (Medical): No    Lack of Transportation (Non-Medical): No  Physical Activity: Not on file  Stress: Not on file  Social Connections: Unknown (07/26/2021)   Received from [facility]   Social Network    Social Network: Not on file   Additional Social History:                          Current Medications: Current Facility-Administered Medications  Medication Dose Route Frequency Provider Last Rate Last Admin   acetaminophen (TYLENOL) tablet 650 mg  650 mg Oral Q6H PRN Ardis Hughs, NP       alum & mag hydroxide-simeth (MAALOX/MYLANTA) 200-200-20 MG/5ML suspension 30 mL  30 mL Oral Q4H PRN Ardis Hughs, NP       ARIPiprazole (ABILIFY) tablet 5 mg  5 mg Oral QHS Ntuen, Tina C, FNP   5 mg at 12/07/22 2146   diphenhydrAMINE (BENADRYL) capsule 50 mg  50 mg Oral TID PRN Ardis Hughs, NP       Or   diphenhydrAMINE (BENADRYL) injection 50  mg  50 mg Intramuscular TID PRN Ardis Hughs, NP       DULoxetine (CYMBALTA) DR capsule 60 mg  60 mg Oral Daily Ntuen, Jesusita Oka, FNP   60 mg at 12/08/22 0831   haloperidol (HALDOL) tablet 5 mg  5 mg Oral TID PRN Ardis Hughs, NP       Or   haloperidol lactate (HALDOL) injection 5 mg  5 mg Intramuscular TID PRN Ardis Hughs, NP       hydrOXYzine (ATARAX) tablet 25 mg  25 mg Oral TID PRN Ardis Hughs, NP   25 mg at 12/06/22 2053   LORazepam (ATIVAN) tablet 2 mg  2 mg Oral TID PRN Ardis Hughs, NP       Or   LORazepam (ATIVAN) injection 2 mg  2 mg Intramuscular TID PRN Ardis Hughs, NP       magnesium hydroxide (MILK OF  MAGNESIA) suspension 30 mL  30 mL Oral Daily PRN Ardis Hughs, NP       multivitamin with minerals tablet 1 tablet  1 tablet Oral Daily Ardis Hughs, NP   1 tablet at 12/08/22 0831   naltrexone (DEPADE) tablet 50 mg  50 mg Oral Daily Ardis Hughs, NP   50 mg at 12/08/22 0831   propranolol (INDERAL) tablet 10 mg  10 mg Oral Q12H PRN Ardis Hughs, NP       tiZANidine (ZANAFLEX) tablet 4 mg  4 mg Oral QHS Ntuen, Jesusita Oka, FNP   4 mg at 12/07/22 2146   traZODone (DESYREL) tablet 50 mg  50 mg Oral QHS PRN Ardis Hughs, NP   50 mg at 12/07/22 2145    Lab Results: No results found for this or any previous visit (from the past 48 hour(s)).  Blood Alcohol level:  Lab Results  Component Value Date   ETH <10 12/04/2022   ETH 68 (H) 09/21/2022    Metabolic Disorder Labs: Lab Results  Component Value Date   HGBA1C 4.9 12/04/2022   MPG 93.93 12/04/2022   MPG 91.06 04/12/2022   Lab Results  Component Value Date   PROLACTIN 10.9 04/12/2022   Lab Results  Component Value Date   CHOL 190 12/04/2022   TRIG 74 12/04/2022   HDL 61 12/04/2022   CHOLHDL 3.1 12/04/2022   VLDL 15 12/04/2022   LDLCALC 114 (H) 12/04/2022   LDLCALC 108 (H) 04/12/2022    Physical Findings: AIMS:  , ,  ,  ,    CIWA:    COWS:      Musculoskeletal: Strength & Muscle Tone: within normal limits Gait & Station: normal Patient leans: N/A  Psychiatric Specialty Exam:  Presentation  General Appearance:  Casual; Fairly Groomed  Eye Contact: Good  Speech: Normal Rate  Speech Volume: Normal  Handedness: Right   Mood and Affect  Mood: Anxious  Affect: Congruent; Full Range   Thought Process  Thought Processes: Linear  Descriptions of Associations:Intact  Orientation:Full (Time, Place and Person)  Thought Content:Logical  History of Schizophrenia/Schizoaffective disorder:No  Duration of Psychotic Symptoms:No data recorded Hallucinations:Hallucinations: None  Ideas of Reference:None  Suicidal Thoughts:Suicidal Thoughts: Yes, Passive SI Passive Intent and/or Plan: Without Intent; Without Plan  Homicidal Thoughts:Homicidal Thoughts: No   Sensorium  Memory: Immediate Good; Recent Good; Remote Good  Judgment: Fair  Insight: Fair   Chartered certified accountant: Fair  Attention Span: Fair  Recall: Good  Fund of Knowledge: Good  Language: Good   Psychomotor Activity  Psychomotor Activity: Psychomotor Activity: Normal   Assets  Assets: Communication Skills; Desire for Improvement; Housing; Physical Health; Resilience; Social Support   Sleep  Sleep: Sleep: Fair    Physical Exam: Physical Exam Vitals reviewed.  Constitutional:      General: She is not in acute distress.    Appearance: She is not toxic-appearing.  Pulmonary:     Effort: Pulmonary effort is normal.  Neurological:     Mental Status: She is alert.     Motor: No weakness.     Gait: Gait normal.  Psychiatric:        Mood and Affect: Mood normal.        Behavior: Behavior normal.        Thought Content: Thought content normal.        Judgment: Judgment normal.    Review of Systems  Constitutional:  Negative for chills and fever.  Cardiovascular:  Negative for chest pain and  palpitations.  Neurological:  Negative for dizziness, tingling, tremors and headaches.  Psychiatric/Behavioral:  Positive for suicidal ideas. Negative for depression, hallucinations, memory loss and substance abuse. The patient is not nervous/anxious and does not have insomnia.   All other systems reviewed and are negative.  Blood pressure 101/79, pulse (!) 114, temperature 98.3 F (36.8 C), temperature source Oral, resp. rate 14, height 5\' 2"  (1.575 m), weight 93.7 kg, SpO2 97%. Body mass index is 37.79 kg/m.   Treatment Plan Summary:  Assessment: MDD (major depressive disorder), recurrent episode, severe (HCC) GAD Alcohol use d/o Borderline PD   Plan:  Medication: Continue Cymbalta DR capsule 60 mg p.o. daily for depression Continue Abilify 5 mg p.o. daily to augment antidepressant/mood stabilization Continue naltrexone tablet 50 mg p.o. daily for alcohol dependence Continue trazodone tablet 50 mg p.o. at bedtime as needed for insomnia Continue hydroxyzine 25 mg tablet p.o. 3 times daily as needed for anxiety   Agitation protocol: Benadryl capsule 50 mg p.o. or IM 3 times daily as needed agitation   Haldol tablets 5 mg po IM 3 times daily as needed agitation   Lorazepam tablet 2 mg p.o. or IM 3 times daily as needed agitation     Other PRN Medications -Acetaminophen 650 mg every 6 as needed/mild pain -Maalox 30 mL oral every 4 as needed/digestion -Magnesium hydroxide 30 mL daily as needed/mild constipation   -- The risks/benefits/side-effects/alternatives to this medication were discussed in detail with the patient and time was given for questions. The patient consents to medication trial.  -- Metabolic profile and EKG monitoring obtained while on an atypical antipsychotic (BMI: Lipid Panel: HbgA1c: QTc:)  -- Encouraged patient to participate in unit milieu and in scheduled group therapies    Admission lab reviewed:  CMP: WNL.  Lipid panel: LDL 114, otherwise normal.  CBC  with differential: WNL.  Hemoglobin A1c 4.9, normal.  Glucose 92.  TSH 3.669.  BAL on 710 2468; on 12/04/2022 less than 10.     New labs ordered: None   EKG reviewed:  EKG reviewed: NSR, vent rate 66, QT/QTc 384/402.   Safety and Monitoring: Voluntary admission to inpatient psychiatric unit for safety, stabilization and treatment Daily contact with patient to assess and evaluate symptoms and progress in treatment Patient's case to be discussed in multi-disciplinary team meeting Observation Level : q15 minute checks Vital signs: q12 hours Precautions: suicide, but pt currently verbally contracts for safety on unit    Discharge Planning: Social work and case management to assist with discharge planning and identification of hospital follow-up needs prior to discharge Estimated LOS: tomorrow  Discharge Concerns: Need to establish a safety plan; Medication compliance and effectiveness Discharge Goals: Return home with outpatient referrals for mental health follow-up including medication management/psychotherapy.    Cristy Hilts, MD 12/08/2022, 3:32 PM  Total Time Spent in Direct Patient Care:  I personally spent 25 minutes on the unit in direct patient care. The direct patient care time included face-to-face time with the patient, reviewing the patient's chart, communicating with other professionals, and coordinating care. Greater than 50% of this time was spent in counseling or coordinating care with the patient regarding goals of hospitalization, psycho-education, and discharge planning needs.   Phineas Inches, MD Psychiatrist

## 2022-12-08 NOTE — Group Note (Signed)
LCSW Group Therapy Note   Group Date: 12/08/2022 Start Time: 1100 End Time: 1200  Type of Therapy and Topic: Group Therapy: Relationship Check-Ins   Participation Level: Minimal   Description Group:  In this group patients are encouraged to rate how well or not well they are able to improve their relationships in Beliefs and Values, Communication, Family and Friends, Actuary and Household, and Intimacy. Patients will be able to discuss and identify what is going well within these aspects and what is not. Patients will be able to find appropriate ways, solutions, and skills that will help them within the selected relationships category. Patients will be encouraged to share and reflect on why things within their relationships are not going so well and get feedback from the instructor or their peers.  This group will be solution focused and process-oriented with patients' participation in sharing and listening to their own and peers experience; along with receive support and advice on how to improve or change the circumstance that is known to be challenging in their relationships category (Beliefs and Values, Communication, Family and Friends, Actuary and Household, and Intimacy).   Therapeutic Goals:  Patient will identify their strengths and weakness within their Beliefs and Values, Communication, Family and Friends, Actuary and Household, and Intimacy relationships. Patient will identify reasons why and how they can improve their relationships.  Patient will identify how they can be supportive and honest to themselves and in their relationships.  Patient will be able to gain support and give support to others with similar challenges.   Summary of Patient Progress  Pt was active in group  Therapeutic Modalities:  Solution Focused Therapy Cognitive Behavioral Therapy  Psychodynamic Therapy  Dialectical Behavior Therapy   Izell [place], LCSW 12/08/2022  12:38 PM

## 2022-12-08 NOTE — Progress Notes (Signed)
   12/07/22 2200  Psych Admission Type (Psych Patients Only)  Admission Status Voluntary  Psychosocial Assessment  Patient Complaints Anxiety  Eye Contact Fair  Facial Expression Animated  Affect Anxious  Speech Logical/coherent  Interaction Assertive  Motor Activity Other (Comment)  Appearance/Hygiene Unremarkable  Behavior Characteristics Cooperative;Appropriate to situation  Mood Depressed;Anxious  Thought Process  Coherency WDL  Content WDL  Delusions None reported or observed  Perception WDL  Hallucination None reported or observed  Judgment Impaired  Confusion None  Danger to Self  Current suicidal ideation? Passive  Agreement Not to Harm Self Yes  Description of Agreement VERBAL

## 2022-12-08 NOTE — [facility] Group Notes (Signed)
Adult Psychoeducational Group Note  Date:  12/08/2022 Time:  9:35 PM  Group Topic/Focus:  Wrap-Up Group:   The focus of this group is to help patients review their daily goal of treatment and discuss progress on daily workbooks.  Participation Level:  Active  Participation Quality:  Appropriate  Affect:  Appropriate  Cognitive:  Appropriate  Insight: Appropriate  Engagement in Group:  Engaged  Modes of Intervention:  Discussion and Support  Additional Comments:  Pt told that today was a generally good day on the unit, the highlight of which was good meals in the cafeteria. On the subject of ways to stay well upon discharge, Pt mentioned wanting to establish a better sleep routine, which would improve her overall quality of life. Pt rated her day an 8 out of 10.  Christ Kick 12/08/2022, 9:35 PM

## 2022-12-08 NOTE — Plan of Care (Signed)
Problem: Activity: Goal: Interest or engagement in activities will improve Outcome: Progressing Goal: Sleeping patterns will improve Outcome: Progressing

## 2022-12-09 DIAGNOSIS — F332 Major depressive disorder, recurrent severe without psychotic features: Secondary | ICD-10-CM | POA: Diagnosis not present

## 2022-12-09 MED ORDER — DULOXETINE HCL 60 MG PO CPEP: 60.0000 mg | ORAL_CAPSULE | Freq: Every day | ORAL | 0 refills | Status: DC

## 2022-12-09 MED ORDER — ARIPIPRAZOLE 5 MG PO TABS: 5.0000 mg | ORAL_TABLET | Freq: Every day | ORAL | 0 refills | Status: DC

## 2022-12-09 MED ORDER — TRAZODONE HCL 50 MG PO TABS: 50.0000 mg | ORAL_TABLET | Freq: Every evening | ORAL | 0 refills | Status: AC | PRN

## 2022-12-09 MED ORDER — NALTREXONE HCL 50 MG PO TABS: 50.0000 mg | ORAL_TABLET | Freq: Every day | ORAL | 0 refills | Status: AC

## 2022-12-09 MED ORDER — HYDROXYZINE HCL 25 MG PO TABS: 25.0000 mg | ORAL_TABLET | Freq: Three times a day (TID) | ORAL | 0 refills | Status: AC | PRN

## 2022-12-09 NOTE — [facility] Suicide Risk Assessment (Signed)
[facility] Discharge Suicide Risk Assessment   Principal Problem: MDD (major depressive disorder), recurrent episode, severe (HCC) Discharge Diagnoses: Principal Problem:   MDD (major depressive disorder), recurrent episode, severe (HCC) Active Problems:   Borderline personality disorder (HCC)   Alcohol use disorder   GAD (generalized anxiety disorder)   Total Time spent with patient: 20 minutes  Christina Hull is a 32 year old Caucasian female with prior psychiatric history significant for MDD, generalized anxiety disorder, and alcohol use disorder who presents voluntarily to [facility] from [facility] for worsening depression x 1 week, resulting in suicidal ideation with several plans.     During the patient's hospitalization, patient had extensive initial psychiatric evaluation, and follow-up psychiatric evaluations every day.   Psychiatric diagnoses provided upon initial assessment:  MDD (major depressive disorder), recurrent episode, severe (HCC) GAD Alcohol use d/o Borderline PD   Patient's psychiatric medications were adjusted on admission:  Decr cymbalta from 90 mg to 60 mg every day Incr abilify from 4 mg to 5 mg every day  Do not restart propranolol    During the hospitalization, other adjustments were made to the patient's psychiatric medication regimen: none    Patient's care was discussed during the interdisciplinary team meeting every day during the hospitalization.   The patient denied having side effects to prescribed psychiatric medication.   Gradually, patient started adjusting to milieu. The patient was evaluated each day by a clinical provider to ascertain response to treatment. Improvement was noted by the patient's report of decreasing symptoms, improved sleep and appetite, affect, medication tolerance, behavior, and participation in unit programming.  Patient was asked each day to complete a self inventory noting mood, mental status, pain, new symptoms,  anxiety and concerns.     Symptoms were reported as significantly decreased or resolved completely by discharge.    On day of discharge, the patient reports that their mood is stable. The patient reports that SI has returned to baseline, w/o any intent or plan. Patient denies having homicidal thoughts.  Patient denies having auditory hallucinations.  Patient denies any visual hallucinations or other symptoms of psychosis. The patient was motivated to continue taking medication with a goal of continued improvement in mental health.    On day of dc, I called the pt's husband Casimiro Needle [phone] - We discussed the pt's diagnosis, hospital course, treatment, response to treatment, and discharge planning.  At the end of the call, the caller had no further questions.    The patient reports their target psychiatric symptoms of depression and intense suicidal thoughts, all responded well to the psychiatric medications, and the patient reports overall benefit other psychiatric hospitalization. Supportive psychotherapy was provided to the patient. The patient also participated in regular group therapy while hospitalized. Coping skills, problem solving as well as relaxation therapies were also part of the unit programming.   Labs were reviewed with the patient, and abnormal results were discussed with the patient.   The patient is able to verbalize their individual safety plan to this provider.   # It is recommended to the patient to continue psychiatric medications as prescribed, after discharge from the hospital.     # It is recommended to the patient to follow up with your outpatient psychiatric provider and PCP.   # It was discussed with the patient, the impact of alcohol, drugs, tobacco have been there overall psychiatric and medical wellbeing, and total abstinence from substance use was recommended the patient.ed.   # Prescriptions provided or sent directly  to preferred pharmacy at discharge. Patient  agreeable to plan. Given opportunity to ask questions. Appears to feel comfortable with discharge.    # In the event of worsening symptoms, the patient is instructed to call the crisis hotline, 911 and or go to the nearest ED for appropriate evaluation and treatment of symptoms. To follow-up with primary care provider for other medical issues, concerns and or health care needs   # Patient was discharged to care of husband, with a plan to follow up as noted below.      Psychiatric Specialty Exam  Presentation  General Appearance:  Appropriate for Environment; Casual; Fairly Groomed  Eye Contact: Good  Speech: Normal Rate; Clear and Coherent  Speech Volume: Normal  Handedness: Right   Mood and Affect  Mood: Euthymic  Duration of Depression Symptoms: Less than two weeks  Affect: Appropriate; Congruent; Full Range   Thought Process  Thought Processes: Linear  Descriptions of Associations:Intact  Orientation:Full (Time, Place and Person)  Thought Content:Logical  History of Schizophrenia/Schizoaffective disorder:No  Duration of Psychotic Symptoms:No data recorded Hallucinations:Hallucinations: None  Ideas of Reference:None  Suicidal Thoughts:Suicidal Thoughts: Yes, Passive (described as chronic, at baseline) SI Passive Intent and/or Plan: Without Intent; Without Plan  Homicidal Thoughts:Homicidal Thoughts: No   Sensorium  Memory: Immediate Good; Recent Good; Remote Good  Judgment: Good  Insight: Good   Executive Functions  Concentration: Good  Attention Span: Good  Recall: Good  Fund of Knowledge: Good  Language: Good   Psychomotor Activity  Psychomotor Activity: Psychomotor Activity: Normal   Assets  Assets: Communication Skills; Desire for Improvement; Housing; Physical Health; Resilience; Social Support   Sleep  Sleep: Sleep: Fair   Physical Exam: Physical Exam See discharge summary  ROS See discharge summary    Blood pressure 94/72, pulse (!) 108, temperature 98.5 F (36.9 C), temperature source Oral, resp. rate 14, height 5\' 2"  (1.575 m), weight 93.7 kg, SpO2 99%. Body mass index is 37.79 kg/m.  Mental Status Per Nursing Assessment::   On Admission:  NA  Demographic factors:  Caucasian Loss Factors:  Loss of significant relationship Historical Factors:  Prior suicide attempts Risk Reduction Factors:  Living with another person, especially a relative, Positive social support, Positive coping skills or problem solving skills  Continued Clinical Symptoms:  Mood is stable. Reporting SI is chronic and at baseline.   Cognitive Features That Contribute To Risk:  None    Suicide Risk:  Mild:  Pt reports that suicidal thoughts are at baseline, chronic, without any intent or plans, mild dysphoria and related symptoms, good self-control (both objective and subjective assessment), few other risk factors, and identifiable protective factors, including available and accessible social support.    Follow-up Information     [facility]. Go on 12/29/2022.   Why: You have an appointment for medication management services on 12/29/22 at 2:15 pm.  This appointment will be held in person. Contact information: [address] [place], [place] [address]  P:  [phone] F:  [phone]        [facility] Schedule an appointment as soon as possible for a visit.   Why: Per provider request, please call to personally schedule an appointment for DBT therapy services. Contact information: [address] [place] [address] [phone]                 Plan Of Care/Follow-up recommendations:   -Follow-up with your outpatient psychiatric provider -instructions on appointment date, time, and address (location)  are provided to you in discharge paperwork.   -Take your psychiatric medications as prescribed at discharge - instructions are provided  to you in the discharge paperwork   -Follow-up with outpatient primary care doctor and other specialists -for management of preventative medicine and chronic medical disease   -If you are prescribed an atypical antipsychotic medication, we recommend that your outpatient psychiatrist follow routine screening for side effects within 3 months of discharge, including monitoring: AIMS scale, height, weight, blood pressure, fasting lipid panel, HbA1c, and fasting blood sugar.    -Recommend total abstinence from alcohol, tobacco, and other illicit drug use at discharge.    -If your psychiatric symptoms recur, worsen, or if you have side effects to your psychiatric medications, call your outpatient psychiatric provider, 911, [phone] or go to the nearest emergency department.   -If suicidal thoughts occur, immediately call your outpatient psychiatric provider, 911, [phone] or go to the nearest emergency department.    Cristy Hilts, MD 12/09/2022, 9:32 AM

## 2022-12-09 NOTE — Progress Notes (Signed)
   12/08/22 2000  Psych Admission Type (Psych Patients Only)  Admission Status Voluntary  Psychosocial Assessment  Patient Complaints Anxiety  Eye Contact Fair  Facial Expression Animated  Affect Anxious  Speech Logical/coherent  Interaction Assertive  Motor Activity Other (Comment)  Appearance/Hygiene Unremarkable  Behavior Characteristics Cooperative;Appropriate to situation  Mood Anxious  Thought Process  Coherency WDL  Content WDL  Delusions None reported or observed  Perception WDL  Hallucination None reported or observed  Judgment Impaired  Confusion None  Danger to Self  Current suicidal ideation? Passive  Agreement Not to Harm Self Yes  Description of Agreement VERBAL  Danger to Others  Danger to Others None reported or observed

## 2022-12-09 NOTE — Plan of Care (Signed)
  Problem: Education: Goal: Emotional status will improve Outcome: Progressing Goal: Mental status will improve Outcome: Progressing   Problem: Activity: Goal: Interest or engagement in activities will improve Outcome: Progressing Goal: Sleeping patterns will improve Outcome: Progressing

## 2022-12-09 NOTE — Progress Notes (Signed)
   12/09/22 0800  Psych Admission Type (Psych Patients Only)  Admission Status Voluntary  Psychosocial Assessment  Patient Complaints Anxiety  Eye Contact Fair  Facial Expression Animated  Affect Anxious;Appropriate to circumstance  Speech Logical/coherent  Interaction Assertive  Motor Activity Other (Comment) (WDL)  Appearance/Hygiene Unremarkable  Behavior Characteristics Cooperative;Appropriate to situation  Mood Anxious  Thought Process  Coherency WDL  Content WDL  Delusions None reported or observed  Perception WDL  Hallucination None reported or observed  Judgment Impaired  Confusion None  Danger to Self  Current suicidal ideation? Passive  Description of Suicide Plan No plan  Self-Injurious Behavior Some self-injurious ideation observed or expressed.  No lethal plan expressed   Agreement Not to Harm Self Yes  Description of Agreement Verbal  Danger to Others  Danger to Others None reported or observed

## 2022-12-09 NOTE — Progress Notes (Addendum)
Patient discharged from [facility] on 12/09/22. Patient continues to endorse chronic passive SI, with no plan or intention. Suicide safety plan completed, reviewed with this RN, given to the patient, and a copy in the chart. Patient is alert, oriented, and cooperative. RN provided patient with discharge paperwork and reviewed information with patient. Patient expressed that she understood all of the discharge instructions. Pt was satisfied with belongings returned to her from the locker and at bedside. Discharged patient to [facility] waiting room.

## 2022-12-09 NOTE — Group Note (Signed)
Recreation Therapy Group Note   Group Topic:Leisure Education  Group Date: 12/09/2022 Start Time: 0930 End Time: 1000 Facilitators: Lyell Clugston-McCall, LRT,CTRS Location: [address] Dayroom   Group Topic: Leisure Education   Goal Area(s) Addresses:  Patient will successfully demonstrate knowledge of leisure and recreation interests. Patient will successfully identify benefits of leisure participation.  Patient will verbalize appropriate recreation activities to use post discharge.  Intervention: Music Trivia   Group Description: LRT facilitated a competitive group game to challenge patients knowledge of music. In teams, patients were asked to answer trivia questions that required patients to finish the lyric or guess the artist. The team with the most points at the end of the game won.   Education:  Leisure Education, Publishing copy Outcome: Acknowledges education   Affect/Mood: N/A   Participation Level: Did not attend    Clinical Observations/Individualized Feedback:     Plan: Continue to engage patient in RT group sessions 2-3x/week.   Christina Hull, LRT,CTRS 12/09/2022 12:15 PM

## 2022-12-09 NOTE — Plan of Care (Signed)
  Problem: Activity: Goal: Interest or engagement in activities will improve Outcome: Progressing   Problem: Coping: Goal: Ability to demonstrate self-control will improve Outcome: Progressing   

## 2022-12-09 NOTE — Discharge Summary (Signed)
Physician Discharge Summary Note  Patient:  Christina Hull is an 32 y.o., female MRN:  [id number] DOB:  02/19/1991 Patient phone:  [phone] (home)  Patient address:   [address] [place] [address],  Total Time spent with patient: 20 minutes  Date of Admission:  12/05/2022 Date of Discharge: 09-27/2024  Reason for Admission:    STEFHANIE KACHMAR is a 32 year old Caucasian female with prior psychiatric history significant for MDD, generalized anxiety disorder, and alcohol use disorder who presents voluntarily to [facility] from [facility] for worsening depression x 1 week, resulting in suicidal ideation with several plans.      Principal Problem: MDD (major depressive disorder), recurrent episode, severe (HCC) Discharge Diagnoses: Principal Problem:   MDD (major depressive disorder), recurrent episode, severe (HCC) Active Problems:   Borderline personality disorder (HCC)   Alcohol use disorder   GAD (generalized anxiety disorder)   Past Psychiatric History:  Previous Psych Diagnoses: MDD, GAD, alcohol use disorder and history, history of borderline personality Prior inpatient treatment: Yes, at [facility] on January 2024, & July 2024 [facility] and medication management Prior rehab hx: Goes to IOP at [facility]. Psychotherapy hx: Yes History of suicide: Yes, January 2024 attempted to stab self.  July 2024 and September 2024 had multiple plans for suicide. History of homicide or aggression: Denies Psychiatric medication history: Patient has been on trial Cymbalta, Abilify, hydroxyzine, naltrexone, propranolol, trazodone, tizanidine.   Psychiatric medication compliance history:Taking her medication as ordered.  Compliance Neuromodulation history: Denies Current [facility] and [facility]  Past Medical History:  Past Medical History:  Diagnosis Date   PCOS (polycystic ovarian syndrome)    History reviewed. No pertinent surgical history. Family History:  Family History  Problem Relation Age of Onset   Cancer Other    Heart disease Other    Depression Mother    Cancer Mother    COPD Mother    Arthritis Mother    Bipolar disorder Mother    Cancer Paternal Grandmother    Family Psychiatric  History: See H&P   Social History:  Social History   Substance and Sexual Activity  Alcohol Use None     Social History   Substance and Sexual Activity  Drug Use Not on file    Social History   Socioeconomic History   Marital status: Married    Spouse name: Not on file   Number of children: Not on file   Years of education: Not on file   Highest education level: Not on file  Occupational History   Not on file  Tobacco Use   Smoking status: Former    Types: Cigarettes    Passive exposure: Past   Smokeless tobacco: Never  Substance and Sexual Activity   Alcohol use: Not on file   Drug use: Not on file   Sexual activity: Not on file  Other Topics Concern   Not on file  Social History Narrative   ** Merged History Encounter **       Social Determinants of Health   Financial Resource Strain: Not on file  Food Insecurity: No Food Insecurity (12/05/2022)   Hunger Vital Sign    Worried About Running Out of Food in the Last Year: Never true    Ran Out of Food in the Last Year: Never true  Transportation Needs:  No Transportation Needs (12/05/2022)   PRAPARE - Administrator, Civil Service (Medical): No    Lack of Transportation (Non-Medical): No  Physical Activity: Not on file  Stress: Not on file  Social Connections: Unknown (07/26/2021)   [facility] from [facility]   Social Network    Social Network: Not on file    Hospital Course:   During the patient's hospitalization, patient had extensive initial psychiatric  evaluation, and follow-up psychiatric evaluations every day.  Psychiatric diagnoses provided upon initial assessment:  MDD (major depressive disorder), recurrent episode, severe (HCC) GAD Alcohol use d/o Borderline PD  Patient's psychiatric medications were adjusted on admission:  Decr cymbalta from 90 mg to 60 mg every day Incr abilify from 4 mg to 5 mg every day  Do not restart propranolol   During the hospitalization, other adjustments were made to the patient's psychiatric medication regimen: none   Patient's care was discussed during the interdisciplinary team meeting every day during the hospitalization.  The patient denied having side effects to prescribed psychiatric medication.  Gradually, patient started adjusting to milieu. The patient was evaluated each day by a clinical provider to ascertain response to treatment. Improvement was noted by the patient's report of decreasing symptoms, improved sleep and appetite, affect, medication tolerance, behavior, and participation in unit programming.  Patient was asked each day to complete a self inventory noting mood, mental status, pain, new symptoms, anxiety and concerns.    Symptoms were reported as significantly decreased or resolved completely by discharge.   On day of discharge, the patient reports that their mood is stable. The patient reports that SI has returned to baseline, w/o any intent or plan. Patient denies having homicidal thoughts.  Patient denies having auditory hallucinations.  Patient denies any visual hallucinations or other symptoms of psychosis. The patient was motivated to continue taking medication with a goal of continued improvement in mental health.   On day of dc, I called the pt's husband Casimiro Needle [phone] - We discussed the pt's diagnosis, hospital course, treatment, response to treatment, and discharge planning.  At the end of the call, the caller had no further questions.   The patient reports their  target psychiatric symptoms of depression and intense suicidal thoughts, all responded well to the psychiatric medications, and the patient reports overall benefit other psychiatric hospitalization. Supportive psychotherapy was provided to the patient. The patient also participated in regular group therapy while hospitalized. Coping skills, problem solving as well as relaxation therapies were also part of the unit programming.  Labs were reviewed with the patient, and abnormal results were discussed with the patient.  The patient is able to verbalize their individual safety plan to this provider.  # It is recommended to the patient to continue psychiatric medications as prescribed, after discharge from the hospital.    # It is recommended to the patient to follow up with your outpatient psychiatric provider and PCP.  # It was discussed with the patient, the impact of alcohol, drugs, tobacco have been there overall psychiatric and medical wellbeing, and total abstinence from substance use was recommended the patient.ed.  # Prescriptions provided or sent directly to preferred pharmacy at discharge. Patient agreeable to plan. Given opportunity to ask questions. Appears to feel comfortable with discharge.    # In the event of worsening symptoms, the patient is instructed to call the crisis hotline, 911 and or go to the nearest ED for appropriate evaluation and treatment of symptoms. To follow-up  with primary care provider for other medical issues, concerns and or health care needs  # Patient was discharged to care of husband, with a plan to follow up as noted below.   Physical Findings: AIMS:  , ,  ,  ,    CIWA:    COWS:     Aims score zero on my exam. No eps on my exam.   Musculoskeletal: Strength & Muscle Tone: within normal limits Gait & Station: normal Patient leans: N/A   Psychiatric Specialty Exam:  Presentation  General Appearance:  Appropriate for Environment; Casual; Fairly  Groomed  Eye Contact: Good  Speech: Normal Rate; Clear and Coherent  Speech Volume: Normal  Handedness: Right   Mood and Affect  Mood: Euthymic  Affect: Appropriate; Congruent; Full Range   Thought Process  Thought Processes: Linear  Descriptions of Associations:Intact  Orientation:Full (Time, Place and Person)  Thought Content:Logical  History of Schizophrenia/Schizoaffective disorder:No  Duration of Psychotic Symptoms:No data recorded Hallucinations:Hallucinations: None  Ideas of Reference:None  Suicidal Thoughts:Suicidal Thoughts: Yes, Passive (described as chronic, at baseline) SI Passive Intent and/or Plan: Without Intent; Without Plan  Homicidal Thoughts:Homicidal Thoughts: No   Sensorium  Memory: Immediate Good; Recent Good; Remote Good  Judgment: Good  Insight: Good   Executive Functions  Concentration: Good  Attention Span: Good  Recall: Good  Fund of Knowledge: Good  Language: Good   Psychomotor Activity  Psychomotor Activity: Psychomotor Activity: Normal   Assets  Assets: Communication Skills; Desire for Improvement; Housing; Physical Health; Resilience; Social Support   Sleep  Sleep: Sleep: Fair    Physical Exam: Physical Exam Vitals reviewed.  Constitutional:      General: She is not in acute distress.    Appearance: She is not toxic-appearing.  Pulmonary:     Effort: Pulmonary effort is normal.  Neurological:     Mental Status: She is alert.     Motor: No weakness.     Gait: Gait normal.  Psychiatric:        Mood and Affect: Mood normal.        Behavior: Behavior normal.        Thought Content: Thought content normal.        Judgment: Judgment normal.    Review of Systems  Constitutional:  Negative for chills and fever.  Cardiovascular:  Negative for chest pain and palpitations.  Neurological:  Negative for dizziness, tingling, tremors and headaches.  Psychiatric/Behavioral:  Positive for  suicidal ideas. Negative for depression, hallucinations, memory loss and substance abuse. The patient is not nervous/anxious and does not have insomnia.   All other systems reviewed and are negative.  Blood pressure 94/72, pulse (!) 108, temperature 98.5 F (36.9 C), temperature source Oral, resp. rate 14, height 5\' 2"  (1.575 m), weight 93.7 kg, SpO2 99%. Body mass index is 37.79 kg/m.   Social History   Tobacco Use  Smoking Status Former   Types: Cigarettes   Passive exposure: Past  Smokeless Tobacco Never   Tobacco Cessation:  N/A, patient does not currently use tobacco products   Blood Alcohol level:  Lab Results  Component Value Date   ETH <10 12/04/2022   ETH 68 (H) 09/21/2022    Metabolic Disorder Labs:  Lab Results  Component Value Date   HGBA1C 4.9 12/04/2022   MPG 93.93 12/04/2022   MPG 91.06 04/12/2022   Lab Results  Component Value Date   PROLACTIN 10.9 04/12/2022   Lab Results  Component Value Date   CHOL 190  12/04/2022   TRIG 74 12/04/2022   HDL 61 12/04/2022   CHOLHDL 3.1 12/04/2022   VLDL 15 12/04/2022   LDLCALC 114 (H) 12/04/2022   LDLCALC 108 (H) 04/12/2022    See Psychiatric Specialty Exam and Suicide Risk Assessment completed by Attending Physician prior to discharge.  Discharge destination:  Home  Is patient on multiple antipsychotic therapies at discharge:  No   Has Patient had three or more failed trials of antipsychotic monotherapy by history:  No  Recommended Plan for Multiple Antipsychotic Therapies: NA  Discharge Instructions     Diet - low sodium heart healthy   Complete by: As directed    Increase activity slowly   Complete by: As directed       Allergies as of 12/09/2022   No Known Allergies      Medication List     STOP taking these medications    propranolol 10 MG tablet Commonly known as: INDERAL       TAKE these medications      Indication  ARIPiprazole 5 MG tablet Commonly known as: ABILIFY Take 1  tablet (5 mg total) by mouth at bedtime. What changed:  medication strength how much to take when to take this  Indication: Major Depressive Disorder, Antidepressant augmentation.   DULoxetine 60 MG capsule Commonly known as: CYMBALTA Take 1 capsule (60 mg total) by mouth daily. Start taking on: December 10, 2022 What changed: additional instructions  Indication: Major Depressive Disorder   Humira (2 Pen) 40 MG/0.4ML pen Generic drug: adalimumab Inject 40 mg into the skin every 14 (fourteen) days.  Indication: home med   hydrOXYzine 25 MG tablet Commonly known as: ATARAX Take 1 tablet (25 mg total) by mouth 3 (three) times daily as needed for anxiety. What changed:  medication strength how much to take  Indication: Feeling Anxious   multivitamin with minerals Tabs tablet Take 1 tablet by mouth daily.  Indication: multivitamin   naltrexone 50 MG tablet Commonly known as: DEPADE Take 1 tablet (50 mg total) by mouth daily.  Indication: Abuse or Misuse of Alcohol   tiZANidine 4 MG tablet Commonly known as: ZANAFLEX TAKE ONE TABLET BY MOUTH EVERY 6 HOURS AS NEEDED FOR MUSCLE SPASMS What changed: See the new instructions.  Indication: Muscle Spasticity, Musculoskeletal Pain   traZODone 50 MG tablet Commonly known as: DESYREL Take 1 tablet (50 mg total) by mouth at bedtime as needed for sleep.  Indication: Trouble Sleeping        Follow-up Information     [facility]. Go on [facility]/2022.   Why: You have an appointment for medication management services on 12/29/22 at 2:15 pm.  This appointment will be held in person. Contact information: [address] [place], [place] [address]  P:  [phone] F:  [phone]        [facility] Schedule an appointment as soon as possible for a visit.   Why: Per provider request, please call to personally schedule an appointment for DBT therapy services. Contact information: [address] [place] [address] [phone]                 Follow-up recommendations:    Activity: as tolerated  Diet: heart healthy  Other: -Follow-up with your outpatient psychiatric provider -instructions on appointment date, time, and address (location) are provided to you in discharge paperwork.  -Take your psychiatric medications as prescribed at discharge - instructions are provided to you in the discharge paperwork  -  Follow-up with outpatient primary care doctor and other specialists -for management of preventative medicine and chronic medical disease  -If you are prescribed an atypical antipsychotic medication, we recommend that your outpatient psychiatrist follow routine screening for side effects within 3 months of discharge, including monitoring: AIMS scale, height, weight, blood pressure, fasting lipid panel, HbA1c, and fasting blood sugar.   -Recommend total abstinence from alcohol, tobacco, and other illicit drug use at discharge.   -If your psychiatric symptoms recur, worsen, or if you have side effects to your psychiatric medications, call your outpatient psychiatric provider, 911, [phone] or go to the nearest emergency department.  -If suicidal thoughts occur, immediately call your outpatient psychiatric provider, 911, [phone] or go to the nearest emergency department.     Signed: Cristy Hilts, MD 12/09/2022, 9:28 AM  Total Time Spent in Direct Patient Care:  I personally spent 35 minutes on the unit in direct patient care. The direct patient care time included face-to-face time with the patient, reviewing the patient's chart, communicating with other professionals, and coordinating care. Greater than 50% of this time was spent in counseling or coordinating care with the patient regarding goals of hospitalization, psycho-education, and discharge planning needs.   Phineas Inches, MD Psychiatrist

## 2022-12-09 NOTE — Group Note (Signed)
Date:  12/09/2022 Time:  11:28 AM  Group Topic/Focus:  Goals Group:   The focus of this group is to help patients establish daily goals to achieve during treatment and discuss how the patient can incorporate goal setting into their daily lives to aide in recovery.    Participation Level:  Active  Participation Quality:  Appropriate  Affect:  Appropriate  Cognitive:  Appropriate  Insight: Appropriate  Engagement in Group:  Engaged  Modes of Intervention:  Discussion  Additional Comments:     Reymundo Poll 12/09/2022, 11:28 AM

## 2022-12-09 NOTE — Discharge Instructions (Signed)

## 2022-12-09 NOTE — Progress Notes (Signed)
  [facility] Adult Case Management Discharge Plan :  Will you be returning to the same living situation after discharge:  Yes,  pt will be returning home with husband, Oaklie Durrett [phone] At discharge, do you have transportation home?: Yes,  pt will be transported by husband Do you have the ability to pay for your medications: Yes,  pt has active medical coverage  Release of information consent forms completed and in the chart;  Patient's signature needed at discharge.  Patient to Follow up at:  Follow-up Information     [facility]. Go on 12/29/2022.   Why: You have an appointment for medication management services on 12/29/22 at 2:15 pm.  This appointment will be held in person. Contact information: [address] [place], [place] [address]  P:  [phone] F:  [phone]        [facility] Schedule an appointment as soon as possible for a visit.   Why: Per provider request, please call to personally schedule an appointment for DBT therapy services. Contact information: [address] [place] [address] [phone]                 Next level of care provider has access to [facility] Link:no  Safety Planning and Suicide Prevention discussed: Yes,  Safety Planning completed with husband     Has patient been referred to the Quitline?: Patient refused referral for treatment  Patient has been referred for addiction treatment: Patient refused referral for treatment.  Christina Hull, Candace Cruise, LCSWA 12/09/2022, 12:02 PM

## 2023-02-03 ENCOUNTER — Encounter: Payer: Self-pay | Admitting: Family Medicine

## 2023-02-03 ENCOUNTER — Ambulatory Visit (INDEPENDENT_AMBULATORY_CARE_PROVIDER_SITE_OTHER): Payer: Managed Care, Other (non HMO) | Admitting: Family Medicine

## 2023-02-03 VITALS — BP 97/67 | HR 84 | Temp 98.2°F | Ht 62.0 in | Wt 202.8 lb

## 2023-02-03 DIAGNOSIS — Z131 Encounter for screening for diabetes mellitus: Secondary | ICD-10-CM

## 2023-02-03 DIAGNOSIS — F411 Generalized anxiety disorder: Secondary | ICD-10-CM

## 2023-02-03 DIAGNOSIS — L4050 Arthropathic psoriasis, unspecified: Secondary | ICD-10-CM

## 2023-02-03 DIAGNOSIS — E785 Hyperlipidemia, unspecified: Secondary | ICD-10-CM

## 2023-02-03 DIAGNOSIS — F419 Anxiety disorder, unspecified: Secondary | ICD-10-CM

## 2023-02-03 DIAGNOSIS — Z0001 Encounter for general adult medical examination with abnormal findings: Secondary | ICD-10-CM

## 2023-02-03 DIAGNOSIS — L409 Psoriasis, unspecified: Secondary | ICD-10-CM

## 2023-02-03 DIAGNOSIS — F332 Major depressive disorder, recurrent severe without psychotic features: Secondary | ICD-10-CM | POA: Diagnosis not present

## 2023-02-03 MED ORDER — ARIPIPRAZOLE 5 MG PO TABS: 5.0000 mg | ORAL_TABLET | Freq: Every day | ORAL | 0 refills | Status: AC

## 2023-02-03 MED ORDER — DULOXETINE HCL 60 MG PO CPEP: 60.0000 mg | ORAL_CAPSULE | Freq: Every day | ORAL | 0 refills | Status: AC

## 2023-02-03 MED ORDER — TIZANIDINE HCL 4 MG PO TABS: 4.0000 mg | ORAL_TABLET | Freq: Three times a day (TID) | ORAL | 3 refills | Status: AC | PRN

## 2023-02-03 NOTE — Assessment & Plan Note (Signed)
She is working on lifestyle modifications.  She will come back to recheck lipids.

## 2023-02-03 NOTE — Patient Instructions (Addendum)
It was very nice to see you today!  I will refill your medications today.  Please continue to work on diet and exercise.  I will see back in a year.  Come back sooner if needed.  Return in about 1 year (around 02/03/2024).   Take care, Dr Jimmey Ralph  PLEASE NOTE:  If you had any lab tests, please let us know if you have not heard back within a few days. You may see your results on mychart before we have a chance to review them but we will give you a call once they are reviewed by [place].   If we ordered any referrals today, please let us know if you have not heard from their office within the next week.   If you had any urgent prescriptions sent in today, please check with the pharmacy within an hour of our visit to make sure the prescription was transmitted appropriately.   Please try these tips to maintain a healthy lifestyle:  Eat at least 3 REAL meals and 1-2 snacks per day.  Aim for no more than 5 hours between eating.  If you eat breakfast, please do so within one hour of getting up.   Each meal should contain half fruits/vegetables, one quarter protein, and one quarter carbs (no bigger than a computer mouse)  Cut down on sweet beverages. This includes juice, soda, and sweet tea.   Drink at least 1 glass of water with each meal and aim for at least 8 glasses per day  Exercise at least 150 minutes every week.    Preventive Care 31-6 Years Old, Female Preventive care refers to lifestyle choices and visits with your health care provider that can promote health and wellness. Preventive care visits are also called wellness exams. What can I expect for my preventive care visit? Counseling During your preventive care visit, your health care provider may ask about your: Medical history, including: Past medical problems. Family medical history. Pregnancy history. Current health, including: Menstrual cycle. Method of birth control. Emotional well-being. Home life and relationship  well-being. Sexual activity and sexual health. Lifestyle, including: Alcohol, nicotine or tobacco, and drug use. Access to firearms. Diet, exercise, and sleep habits. Work and work Astronomer. Sunscreen use. Safety issues such as seatbelt and bike helmet use. Physical exam Your health care provider may check your: Height and weight. These may be used to calculate your BMI (body mass index). BMI is a measurement that tells if you are at a healthy weight. Waist circumference. This measures the distance around your waistline. This measurement also tells if you are at a healthy weight and may help predict your risk of certain diseases, such as type 2 diabetes and high blood pressure. Heart rate and blood pressure. Body temperature. Skin for abnormal spots. What immunizations do I need?  Vaccines are usually given at various ages, according to a schedule. Your health care provider will recommend vaccines for you based on your age, medical history, and lifestyle or other factors, such as travel or where you work. What tests do I need? Screening Your health care provider may recommend screening tests for certain conditions. This may include: Pelvic exam and Pap test. Lipid and cholesterol levels. Diabetes screening. This is done by checking your blood sugar (glucose) after you have not eaten for a while (fasting). Hepatitis B test. Hepatitis C test. HIV (human immunodeficiency virus) test. STI (sexually transmitted infection) testing, if you are at risk. BRCA-related cancer screening. This may be done if you  have a family history of breast, ovarian, tubal, or peritoneal cancers. Talk with your health care provider about your test results, treatment options, and if necessary, the need for more tests. Follow these instructions at home: Eating and drinking  Eat a healthy diet that includes fresh fruits and vegetables, whole grains, lean protein, and low-fat dairy products. Take vitamin and  mineral supplements as recommended by your health care provider. Do not drink alcohol if: Your health care provider tells you not to drink. You are pregnant, may be pregnant, or are planning to become pregnant. If you drink alcohol: Limit how much you have to 0-1 drink a day. Know how much alcohol is in your drink. In the [place] one drink equals one 12 oz bottle of beer (355 mL), one 5 oz glass of wine (148 mL), or one 1 oz glass of hard liquor (44 mL). Lifestyle Brush your teeth every morning and night with fluoride toothpaste. Floss one time each day. Exercise for at least 30 minutes 5 or more days each week. Do not use any products that contain nicotine or tobacco. These products include cigarettes, chewing tobacco, and vaping devices, such as e-cigarettes. If you need help quitting, ask your health care provider. Do not use drugs. If you are sexually active, practice safe sex. Use a condom or other form of protection to prevent STIs. If you do not wish to become pregnant, use a form of birth control. If you plan to become pregnant, see your health care provider for a prepregnancy visit. Find healthy ways to manage stress, such as: Meditation, yoga, or listening to music. Journaling. Talking to a trusted person. Spending time with friends and family. Minimize exposure to UV radiation to reduce your risk of skin cancer. Safety Always wear your seat belt while driving or riding in a vehicle. Do not drive: If you have been drinking alcohol. Do not ride with someone who has been drinking. If you have been using any mind-altering substances or drugs. While texting. When you are tired or distracted. Wear a helmet and other protective equipment during sports activities. If you have firearms in your house, make sure you follow all gun safety procedures. Seek help if you have been physically or sexually abused. What's next? Go to your health care provider once a year for an annual wellness  visit. Ask your health care provider how often you should have your eyes and teeth checked. Stay up to date on all vaccines. This information is not intended to replace advice given to you by your health care provider. Make sure you discuss any questions you have with your health care provider. Document Revised: 08/26/2020 Document Reviewed: 08/26/2020 Elsevier Patient Education  2024 [employer].

## 2023-02-03 NOTE — Progress Notes (Signed)
Chief Complaint:  Christina Hull is a 32 y.o. female who presents today for her annual comprehensive physical exam.    Assessment/Plan:  Chronic Problems Addressed Today: MDD (major depressive disorder), recurrent episode, severe (HCC) Overall symptoms are very stable on current regimen of Abilify 5 mg daily and Cymbalta 60 mg daily.  Will give a interim refill for these medications until she can establish with her new psychiatrist in a couple of weeks.  She will let us know if she has any issues before she can see her psychiatrist.  GAD (generalized anxiety disorder) See depression A/P.  Will refill her Abilify and Cymbalta until she can establish with new psychiatrist.  Psoriasis Following with rheumatology.  Symptoms have improved significantly on Humira.  Psoriatic arthritis [facility]) Following with rheumatology.  She is on Humira and doing very well with this.  Dyslipidemia She is working on lifestyle modifications.  She will come back to recheck lipids.   Preventative Healthcare: Check labs.  Up-to-date on Pap.  Up-to-date on vaccines.  Patient Counseling(The following topics were reviewed and/or handout was given):  -Nutrition: Stressed importance of moderation in sodium/caffeine intake, saturated fat and cholesterol, caloric balance, sufficient intake of fresh fruits, vegetables, and fiber.  -Stressed the importance of regular exercise.   -Substance Abuse: Discussed cessation/primary prevention of tobacco, alcohol, or other drug use; driving or other dangerous activities under the influence; availability of treatment for abuse.   -Injury prevention: Discussed safety belts, safety helmets, smoke detector, smoking near bedding or upholstery.   -Sexuality: Discussed sexually transmitted diseases, partner selection, use of condoms, avoidance of unintended pregnancy and contraceptive alternatives.   -Dental health: Discussed importance of regular tooth brushing, flossing, and dental  visits.  -Health maintenance and immunizations reviewed. Please refer to Health maintenance section.  Return to care in 1 year for next preventative visit.     Subjective:  HPI:  She has no acute complaints today. She is here for her annual physical today. Since our last visit, she has had a few major depression flares resulting in hospitalization.  She has had her medication adjusted.  She is currently on Abilify 5 mg daily and Cymbalta 60 mg daily.  She is doing well with this combination.  She did establish with an outpatient psychiatrist a couple of months ago however they moved and she has been without a psychiatrist for the last month or so.  She will be establishing with a new psychiatrist in a couple of weeks however needs a refill on her medications to last her until she can see them.  Mood is in a good place today.  No SI or HI.  Overall she feels well.  Lifestyle Diet: Balanced. Trying more and vegetables.  Exercise: Doing a lot of walking.      02/03/2023    7:36 AM  Depression screen PHQ 2/9  Decreased Interest 0  Down, Depressed, Hopeless 0  PHQ - 2 Score 0    There are no preventive care reminders to display for this patient.   ROS: Per HPI, otherwise a complete review of systems was negative.   PMH:  The following were reviewed and entered/updated in epic: Past Medical History:  Diagnosis Date   PCOS (polycystic ovarian syndrome)    Patient Active Problem List   Diagnosis Date Noted   MDD (major depressive disorder), recurrent episode, severe (HCC) 12/05/2022   GAD (generalized anxiety disorder) 09/23/2022   Dyslipidemia 03/22/2021   Psoriasis 03/11/2021   Anxiety 10/27/2020  Psoriatic arthritis (HCC) 05/26/2020   History reviewed. No pertinent surgical history.  Family History  Problem Relation Age of Onset   Cancer Other    Heart disease Other    Depression Mother    Cancer Mother    COPD Mother    Arthritis Mother    Bipolar disorder Mother     Cancer Paternal Grandmother     Medications- reviewed and updated Current Outpatient Medications  Medication Sig Dispense Refill   adalimumab (HUMIRA, 2 PEN,) 40 MG/0.4ML pen Inject 40 mg into the skin every 14 (fourteen) days.     hydrOXYzine (ATARAX) 25 MG tablet Take 1 tablet (25 mg total) by mouth 3 (three) times daily as needed for anxiety. 30 tablet 0   Multiple Vitamin (MULTIVITAMIN WITH MINERALS) TABS tablet Take 1 tablet by mouth daily.     traZODone (DESYREL) 50 MG tablet Take 1 tablet (50 mg total) by mouth at bedtime as needed for sleep. 30 tablet 0   ARIPiprazole (ABILIFY) 5 MG tablet Take 1 tablet (5 mg total) by mouth at bedtime. 30 tablet 0   DULoxetine (CYMBALTA) 60 MG capsule Take 1 capsule (60 mg total) by mouth daily. 30 capsule 0   tiZANidine (ZANAFLEX) 4 MG tablet Take 1 tablet (4 mg total) by mouth every 8 (eight) hours as needed for muscle spasms. 90 tablet 3   No current facility-administered medications for this visit.    Allergies-reviewed and updated No Known Allergies  Social History   Socioeconomic History   Marital status: Married    Spouse name: Not on file   Number of children: Not on file   Years of education: Not on file   Highest education level: Not on file  Occupational History   Not on file  Tobacco Use   Smoking status: Former    Types: Cigarettes    Passive exposure: Past   Smokeless tobacco: Never  Substance and Sexual Activity   Alcohol use: Not on file   Drug use: Not on file   Sexual activity: Not on file  Other Topics Concern   Not on file  Social History Narrative   ** Merged History Encounter **       Social Determinants of Health   Financial Resource Strain: Not on file  Food Insecurity: No Food Insecurity (12/05/2022)   Hunger Vital Sign    Worried About Running Out of Food in the Last Year: Never true    Ran Out of Food in the Last Year: Never true  Transportation Needs: No Transportation Needs (12/05/2022)    PRAPARE - Administrator, Civil Service (Medical): No    Lack of Transportation (Non-Medical): No  Physical Activity: Not on file  Stress: Not on file  Social Connections: Unknown (07/26/2021)   [facility]   Social Network    Social Network: Not on file        Objective:  Physical Exam: BP 97/67   Pulse 84   Temp 98.2 F (36.8 C) (Temporal)   Ht 5\' 2"  (1.575 m)   Wt 202 lb 12.8 oz (92 kg)   LMP 01/13/2023   SpO2 99%   BMI 37.09 kg/m   Body mass index is 37.09 kg/m. Wt Readings from Last 3 Encounters:  02/03/23 202 lb 12.8 oz (92 kg)  06/15/22 206 lb 12.8 oz (93.8 kg)  02/01/22 201 lb 6.4 oz (91.4 kg)   Gen: NAD, resting comfortably HEENT: TMs normal bilaterally. OP  clear. No thyromegaly noted.  CV: RRR with no murmurs appreciated Pulm: NWOB, CTAB with no crackles, wheezes, or rhonchi GI: Normal bowel sounds present. Soft, Nontender, Nondistended. MSK: no edema, cyanosis, or clubbing noted Skin: warm, dry Neuro: CN2-12 grossly intact. Strength 5/5 in upper and lower extremities. Reflexes symmetric and intact bilaterally.  Psych: Normal affect and thought content     Rossi Silvestro M. Jimmey Ralph, MD 02/03/2023 8:33 AM

## 2023-02-03 NOTE — Assessment & Plan Note (Signed)
See depression A/P.  Will refill her Abilify and Cymbalta until she can establish with new psychiatrist.

## 2023-02-03 NOTE — Assessment & Plan Note (Signed)
Following with rheumatology.  She is on Humira and doing very well with this.

## 2023-02-03 NOTE — Assessment & Plan Note (Signed)
Overall symptoms are very stable on current regimen of Abilify 5 mg daily and Cymbalta 60 mg daily.  Will give a interim refill for these medications until she can establish with her new psychiatrist in a couple of weeks.  She will let us know if she has any issues before she can see her psychiatrist.

## 2023-02-03 NOTE — Assessment & Plan Note (Signed)
Following with rheumatology.  Symptoms have improved significantly on Humira.

## 2023-02-08 ENCOUNTER — Other Ambulatory Visit: Payer: Managed Care, Other (non HMO)

## 2023-03-03 ENCOUNTER — Ambulatory Visit (INDEPENDENT_AMBULATORY_CARE_PROVIDER_SITE_OTHER): Payer: Managed Care, Other (non HMO) | Admitting: Family Medicine

## 2023-03-03 ENCOUNTER — Encounter: Payer: Self-pay | Admitting: Family Medicine

## 2023-03-03 VITALS — BP 105/71 | HR 73 | Temp 98.0°F | Ht 62.0 in | Wt 206.2 lb

## 2023-03-03 DIAGNOSIS — R059 Cough, unspecified: Secondary | ICD-10-CM | POA: Diagnosis not present

## 2023-03-03 MED ORDER — ALBUTEROL SULFATE HFA 108 (90 BASE) MCG/ACT IN AERS
2.0000 | INHALATION_SPRAY | Freq: Four times a day (QID) | RESPIRATORY_TRACT | 0 refills | Status: AC | PRN
Start: 2023-03-03 — End: ?

## 2023-03-03 MED ORDER — AZELASTINE HCL 0.1 % NA SOLN: 2.0000 | Freq: Two times a day (BID) | NASAL | 12 refills | Status: AC

## 2023-03-03 NOTE — Progress Notes (Signed)
   Christina Hull is a 32 y.o. female who presents today for an office visit.  Assessment/Plan:  Cough  Reassuring lung exam today.  Likely having postinfectious cough.  She does also have some signs of postnasal drip as well.  Discussed with patient as well likely persist for the next several weeks but should be gradually improving.  Will start Astelin nasal spray to help with her sinus congestion.  She can also use albuterol as needed to help with the wheezing and coughing fits.  No signs of infection today-do not think she needs any antibiotics at this point.  She will let us know if not improving in the next 1 to 2 weeks and would consider x-ray at that time.  We discussed reasons to return to care and seek emergent care.    Subjective:  HPI:  See A/P for status of chronic conditions.  Patient is here today with cough.  This has been going on for a couple of weeks. She did have a upper respiratory infection (URI) at that time including flu like symptoms. 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 She has noticed some more wheezing. She has had some chest tightness. Symptoms are getting a little better. Tried taking OTC medications with some improvement. Drinking tea which helps.  She is still having a lot of sinus congestion and postnasal drip.       Objective:  Physical Exam: BP 105/71   Pulse 73   Temp 98 F (36.7 C) (Temporal)   Ht 5\' 2"  (1.575 m)   Wt 206 lb 3.2 oz (93.5 kg)   LMP 02/24/2023   SpO2 96%   BMI 37.71 kg/m   Gen: No acute distress, resting comfortably HEENT: TMs clear.  OP erythematous.  Nasal mucosa erythematous and boggy laterally. CV: Regular rate and rhythm with no murmurs appreciated Pulm: Normal work of breathing, clear to auscultation bilaterally with no crackles, wheezes, or rhonchi Neuro: Grossly normal, moves all extremities Psych: Normal affect and thought content      Christina Hull M. Jimmey Ralph, MD 03/03/2023 2:34 PM

## 2023-03-03 NOTE — Patient Instructions (Signed)
It was very nice to see you today!  I think you are having some postnasal drip.  Please start the nasal spray.  Use the albuterol inhaler as needed for coughing fits.  This should improve over the next couple of weeks.  Return if symptoms worsen or fail to improve.   Take care, Dr Jimmey Ralph  PLEASE NOTE:  If you had any lab tests, please let us know if you have not heard back within a few days. You may see your results on mychart before we have a chance to review them but we will give you a call once they are reviewed by [place].   If we ordered any referrals today, please let us know if you have not heard from their office within the next week.   If you had any urgent prescriptions sent in today, please check with the pharmacy within an hour of our visit to make sure the prescription was transmitted appropriately.   Please try these tips to maintain a healthy lifestyle:  Eat at least 3 REAL meals and 1-2 snacks per day.  Aim for no more than 5 hours between eating.  If you eat breakfast, please do so within one hour of getting up.   Each meal should contain half fruits/vegetables, one quarter protein, and one quarter carbs (no bigger than a computer mouse)  Cut down on sweet beverages. This includes juice, soda, and sweet tea.   Drink at least 1 glass of water with each meal and aim for at least 8 glasses per day  Exercise at least 150 minutes every week.

## 2024-02-05 ENCOUNTER — Encounter: Payer: Managed Care, Other (non HMO) | Admitting: Family Medicine
